# Patient Record
Sex: Female | Born: 1970
Health system: Southern US, Community
[De-identification: ages and names within clinical notes are randomized; demographics above are authoritative.]

## PROBLEM LIST (undated history)

## (undated) DIAGNOSIS — R200 Anesthesia of skin: Secondary | ICD-10-CM

## (undated) DIAGNOSIS — G43909 Migraine, unspecified, not intractable, without status migrainosus: Secondary | ICD-10-CM

## (undated) DIAGNOSIS — E669 Obesity, unspecified: Secondary | ICD-10-CM

## (undated) DIAGNOSIS — G35D Multiple sclerosis, unspecified: Secondary | ICD-10-CM

## (undated) DIAGNOSIS — M1990 Unspecified osteoarthritis, unspecified site: Secondary | ICD-10-CM

## (undated) DIAGNOSIS — J449 Chronic obstructive pulmonary disease, unspecified: Secondary | ICD-10-CM

## (undated) DIAGNOSIS — E559 Vitamin D deficiency, unspecified: Secondary | ICD-10-CM

## (undated) DIAGNOSIS — R5382 Chronic fatigue, unspecified: Secondary | ICD-10-CM

## (undated) DIAGNOSIS — G43109 Migraine with aura, not intractable, without status migrainosus: Secondary | ICD-10-CM

## (undated) DIAGNOSIS — E538 Deficiency of other specified B group vitamins: Secondary | ICD-10-CM

## (undated) DIAGNOSIS — R269 Unspecified abnormalities of gait and mobility: Secondary | ICD-10-CM

## (undated) DIAGNOSIS — K219 Gastro-esophageal reflux disease without esophagitis: Secondary | ICD-10-CM

## (undated) DIAGNOSIS — G35 Multiple sclerosis: Secondary | ICD-10-CM

## (undated) HISTORY — PX: FOOT SURGERY: SHX648

## (undated) HISTORY — DX: Multiple sclerosis: G35

## (undated) HISTORY — PX: COSMETIC SURGERY: SHX468

## (undated) HISTORY — DX: Multiple sclerosis, unspecified: G35.D

## (undated) HISTORY — DX: Migraine, unspecified, not intractable, without status migrainosus: G43.909

## (undated) HISTORY — DX: Gastro-esophageal reflux disease without esophagitis: K21.9

## (undated) HISTORY — DX: Chronic obstructive pulmonary disease, unspecified: J44.9

## (undated) HISTORY — DX: Unspecified abnormalities of gait and mobility: R26.9

## (undated) HISTORY — PX: NASAL SINUS SURGERY: SHX719

## (undated) HISTORY — DX: Chronic fatigue, unspecified: R53.82

## (undated) HISTORY — DX: Migraine with aura, not intractable, without status migrainosus: G43.109

## (undated) HISTORY — DX: Deficiency of other specified B group vitamins: E53.8

## (undated) HISTORY — DX: Obesity, unspecified: E66.9

## (undated) HISTORY — DX: Anesthesia of skin: R20.0

## (undated) HISTORY — DX: Unspecified osteoarthritis, unspecified site: M19.90

## (undated) HISTORY — PX: TUBAL LIGATION: SHX77

## (undated) HISTORY — DX: Vitamin D deficiency, unspecified: E55.9

---

## 1998-05-25 ENCOUNTER — Other Ambulatory Visit: Admission: RE | Admit: 1998-05-25 | Discharge: 1998-05-25 | Payer: Self-pay | Admitting: Gynecology

## 1999-03-28 ENCOUNTER — Other Ambulatory Visit: Admission: RE | Admit: 1999-03-28 | Discharge: 1999-03-28 | Payer: Self-pay | Admitting: Obstetrics and Gynecology

## 1999-03-28 ENCOUNTER — Encounter (INDEPENDENT_AMBULATORY_CARE_PROVIDER_SITE_OTHER): Payer: Self-pay

## 1999-04-02 ENCOUNTER — Other Ambulatory Visit: Admission: RE | Admit: 1999-04-02 | Discharge: 1999-04-02 | Payer: Self-pay | Admitting: Obstetrics and Gynecology

## 1999-06-13 ENCOUNTER — Other Ambulatory Visit: Admission: RE | Admit: 1999-06-13 | Discharge: 1999-06-13 | Payer: Self-pay | Admitting: Gynecology

## 2000-02-28 ENCOUNTER — Encounter: Payer: Self-pay | Admitting: Gynecology

## 2000-02-28 ENCOUNTER — Ambulatory Visit (HOSPITAL_COMMUNITY): Admission: RE | Admit: 2000-02-28 | Discharge: 2000-02-28 | Payer: Self-pay | Admitting: Gynecology

## 2000-03-04 ENCOUNTER — Inpatient Hospital Stay (HOSPITAL_COMMUNITY): Admission: AD | Admit: 2000-03-04 | Discharge: 2000-03-07 | Payer: Self-pay | Admitting: Gynecology

## 2000-03-04 ENCOUNTER — Encounter (INDEPENDENT_AMBULATORY_CARE_PROVIDER_SITE_OTHER): Payer: Self-pay

## 2000-03-10 ENCOUNTER — Encounter: Admission: RE | Admit: 2000-03-10 | Discharge: 2000-04-24 | Payer: Self-pay | Admitting: Gynecology

## 2000-04-22 ENCOUNTER — Other Ambulatory Visit: Admission: RE | Admit: 2000-04-22 | Discharge: 2000-04-22 | Payer: Self-pay | Admitting: Gynecology

## 2001-04-24 ENCOUNTER — Other Ambulatory Visit: Admission: RE | Admit: 2001-04-24 | Discharge: 2001-04-24 | Payer: Self-pay | Admitting: Gynecology

## 2007-04-28 ENCOUNTER — Other Ambulatory Visit: Admission: RE | Admit: 2007-04-28 | Discharge: 2007-04-28 | Payer: Self-pay | Admitting: Gynecology

## 2010-08-03 ENCOUNTER — Emergency Department (HOSPITAL_COMMUNITY)
Admission: EM | Admit: 2010-08-03 | Discharge: 2010-08-03 | Disposition: A | Discharge status: Home / Self Care | Payer: BC Managed Care – PPO | Attending: Emergency Medicine | Admitting: Emergency Medicine

## 2010-08-03 DIAGNOSIS — J329 Chronic sinusitis, unspecified: Secondary | ICD-10-CM | POA: Insufficient documentation

## 2010-08-03 DIAGNOSIS — G43909 Migraine, unspecified, not intractable, without status migrainosus: Secondary | ICD-10-CM | POA: Insufficient documentation

## 2010-08-31 NOTE — Discharge Summary (Signed)
[facility]  Patient:    Sue Sanchez, Sue Sanchez                   MRN: [id number] Adm. Date:  [id number] Disc. Date: [id number] Attending:  Merrily Pew Dictator:   Antony Contras, [facility]                           Discharge Summary  DISCHARGE DIAGNOSES:          Intrauterine pregnancy at term, history of previous cesarean section, desires repeat.  PROCEDURE:                    Low cervical transverse cesarean section, bilateral tubal sterilization with delivery of viable infant.  HISTORY OF PRESENT ILLNESS:   Patient is a 40 year old gravida 5, para 1-0-3-1 with LMP June 03, 1999, [facility] March 09, 2000.  Prenatal risk factors include history of previous cesarean section, history of maternal club foot. Patient also desires tubal sterilization.  LABORATORIES:                 Blood type O+.  Antibody screen negative.  RPR, HBSAG, HIV nonreactive.  Rubella immune.  MSAFP normal.  GBS positive.  HOSPITAL COURSE:              Patient was admitted on March 04, 2000 for repeat cesarean section and tubal sterilization performed by Dr. Audie Box assisted by Dr. Penni Homans under spinal anesthesia.  She was delivered of an Apgars 8/9 female infant weighing 8 pounds 13 ounces.  Pelvic findings included some uterine adhesions of the right tube and ovary.  Postoperative course: Patient remained afebrile.  Had no difficulty voiding.  Was able to be discharged in satisfactory condition on her third postoperative day.  CBC: Hematocrit 30.9, hemoglobin 11, WBC 9.6, platelets 180.  DISPOSITION:                  Follow up in six weeks.  Continue prenatal vitamins and iron.  Motrin and Tylox for pain. DD:  04/01/00 TD:  04/01/00 Job: [id number]

## 2010-08-31 NOTE — H&P (Signed)
[facility]  Patient:    Sue Sanchez, Sue Sanchez                   MRN: [id number] Adm. Date:  03/04/00 Attending:  Merrily Pew                         History and Physical  CHIEF COMPLAINT:              1. Pregnancy at term.                               2. Prior cesarean section.                               3. Desires repeat cesarean section.                               4. Desires permanent sterilization.  HISTORY OF PRESENT ILLNESS:   A 40 year old, G5, P45, AB3 female at term gestation with history of prior cesarean section who initially wanted attempt at [facility] but now has changed her mind and wants to proceed with a repeat cesarean section.  Risks, benefits, indications, and alternatives were reviewed with her and, again, she declines VBAC and wants to proceed with repeat cesarean section.  The patient also desires permanent tubal sterilization.  PAST MEDICAL HISTORY:         Uncomplicated.  PAST SURGICAL HISTORY:        Includes cesarean section, TAB x 2, orthopedic club foot surgery.  ALLERGIES:                    None.  REVIEW OF SYSTEMS:            Noncontributory.  SOCIAL HISTORY:               Noncontributory.  FAMILY HISTORY:               Noncontributory.  ADMISSION PHYSICAL EXAMINATION:  VITAL SIGNS:                  Afebrile, vital signs stable.  HEENT:                        Normal.  LUNGS:                        Clear.  CARDIAC:                      Regular rate.  No rubs, murmurs, or gallops.  ABDOMEN:                      Benign.  PELVIC:                       Gravid vertex fetus.  Pelvic deferred.  ASSESSMENT:                   1. Pregnancy at term.                               2. History of prior cesarean section.  3. Desires repeat cesarean section.                               4. Desires tubal sterilization.  The risks benefits, indications, and alternatives for the  procedure were reviewed with the patient to include the risks of infection requiring prolonged antibiotics, wound complications, opening and draining of incisions, closure by secondary intention, transfusion including transfusion reaction hepatitis and AIDs, major injury to internal organs including bowel, bladder, ureters, vessels, and nerves necessitating major exploratory repairative surgeries and future repairative surgeries including ostomy formatiion.  The permanency of the tubal sterilization was discussed and stressed as well as the potential for failure, and she does understand and accept the potential for failure.  The patients questions were answered to her satisfaction, and she is ready to proceed with surgery. DD:  03/03/00 TD:  03/03/00 Job: [id number]

## 2010-08-31 NOTE — Op Note (Signed)
[facility]  Patient:    Sue Sanchez, Sue Sanchez                   MRN: [id number] Proc. Date: 03/04/00 Adm. Date:  [id number] Attending:  Merrily Pew                           Operative Report  PREOPERATIVE DIAGNOSES:       1. Pregnancy at term.                               2. History of prior cesarean section.                               3. Desires repeat cesarean section.                               4. Desires permanent sterilization.  POSTOPERATIVE DIAGNOSES:      1. Pregnancy at term.                               2. History of prior cesarean section.                               3. Desires repeat cesarean section.                               4. Desires permanent sterilization.  PROCEDURE:                    Repeat low transverse cervical cesarean section, bilateral tubal sterilization, modified Pomeroy technique.  SURGEON:                      Timothy P. Fontaine, M.D.  ASSISTANT:                    Katy Fitch, M.D.  ESTIMATED BLOOD LOSS:         Less than 500 cc  ANESTHESIA:                   Spinal.  COMPLICATIONS:                None.  SPECIMENS:                    Samples of cord blood, right and left fallopian tube segments.  FINDINGS:                     Normal female infant at 1343, Apgars 8 and 9, weight 8 pounds 13 ounces.  Bladder flap noted to be adherent high on the mid uterine surface which was sharply lysed.  Right fallopian tube segment adherent to the right ovary, left undisturbed.  The remainder of pelvic anatomy noted to be normal.  DESCRIPTION OF PROCEDURE:     The patient was taken to the operating room and underwent regional anesthesia without difficulty and was placed in left-tilt supine position, received an abdominal preparation with Betadine scrub and Betadine solution.  The bladder was empty with an indwelling polar catheter,  placed by sterile technique by the nursing personnel.  The patient  was draped in the usual fashion.  After assuring adequate anesthesia, the abdomen was sharply entered through a repeat Pfannenstiel incision, achieving adequate hemostasis at all levels.  The bladder flap was noted to be relatively high on the lower uterine segment, and this was sharply and bluntly developed without difficulty, and the uterus was then sharply entered in the lower uterine segment and bluntly extended laterally.  The bulging membranes were then ruptured, the fluid noted to be clear.  The infants head delivered through the incision.  The nares and mouth suctioned.  A tight nuchal cord was doubly clamped, cut, and the rest of the infant was then delivered without difficulty.  The infant was then handed to pediatrics in attendance.  Samples of cord blood were obtained.  The placenta was then spontaneously extruded and noted to be intact.  The uterus was exteriorized, and the endometrial cavity was explored with a sponge to remove all placental and membrane fragments. The uterine incision was then closed in one layer using 0 Vicryl suture in a running interlocking stitch, achieving adequate hemostasis.  Attention was then turned to the tubal sterilization.  The left fallopian tube was then identified and traced from its insertion to its fimbriated ends, and a mid tubal segment was then doubly ligated using 0 plain suture and sharply excised.  Tubal lumen as well as adequate hemostasis was grossly visualized. A similar procedure was carried out on the other side.  Of note, on the right, there was a short segment of the fallopian tube that was adherent to the ovary, and this was left undisturbed, as the ovary was otherwise free and mobile, and no other adhesions noted.  The uterus was then returned to the abdomen which was copiously irrigated.  Both tubal suction sites were reinspected showing adequate hemostasis, and the anterior fascia was then reapproximated using 0 Vicryl  suture in a running stitch starting in the angle and meeting in the middle.  Subcutaneous tissues were irrigated.  Hemostasis achieved with electrocautery, and the skin was reapproximated with staples.  A sterile dressing was applied, and the patient was taken to the recovery room in good condition having tolerated the procedure well. DD:  03/04/00 TD:  03/05/00 Job: [id number]

## 2012-08-05 ENCOUNTER — Telehealth: Payer: Self-pay

## 2012-08-05 MED ORDER — GLATIRAMER ACETATE 40 MG/ML ~~LOC~~ SOSY: 40.0000 mg | PREFILLED_SYRINGE | SUBCUTANEOUS | Status: DC

## 2012-08-05 NOTE — Telephone Encounter (Signed)
Patient called clinic and left message saying [facility] and [facility] both stated they need a rx for Copaxone.  We have a fax confirmation showing this was successfully sent on 06/22/2012 at 9:17am.  I will refax enrollment form (rx on form) to [facility] [employer] and as well will send Rx to [facility].

## 2012-08-24 ENCOUNTER — Other Ambulatory Visit: Payer: Self-pay | Admitting: Neurology

## 2012-10-09 DIAGNOSIS — Z0289 Encounter for other administrative examinations: Secondary | ICD-10-CM

## 2012-10-29 ENCOUNTER — Telehealth: Payer: Self-pay

## 2012-10-29 NOTE — Telephone Encounter (Signed)
Called and left Vm. Forms faxed and copies put in mail.

## 2012-12-29 ENCOUNTER — Ambulatory Visit: Payer: Self-pay | Admitting: Nurse Practitioner

## 2013-01-12 ENCOUNTER — Ambulatory Visit: Payer: Self-pay | Admitting: Nurse Practitioner

## 2013-02-21 ENCOUNTER — Other Ambulatory Visit: Payer: Self-pay | Admitting: Neurology

## 2013-02-25 ENCOUNTER — Encounter: Payer: Self-pay | Admitting: Nurse Practitioner

## 2013-02-25 ENCOUNTER — Encounter (INDEPENDENT_AMBULATORY_CARE_PROVIDER_SITE_OTHER): Payer: Self-pay

## 2013-02-25 ENCOUNTER — Ambulatory Visit (INDEPENDENT_AMBULATORY_CARE_PROVIDER_SITE_OTHER): Payer: PRIVATE HEALTH INSURANCE | Admitting: Nurse Practitioner

## 2013-02-25 VITALS — BP 124/71 | HR 63 | Temp 97.2°F | Ht 64.0 in | Wt 195.3 lb

## 2013-02-25 DIAGNOSIS — G35 Multiple sclerosis: Secondary | ICD-10-CM

## 2013-02-25 NOTE — Patient Instructions (Addendum)
We would like you to consider going back on a MS medication to prevent future problems.  We need to check CBC, LFTs, and blood titer for Shingles.  You will need to have a recent eye exam (3 months), a cardiologist evaluation at Dr. Cletis Media office.    The first dose of Gilenya will be done in the cardiology office.  There will need to be a repeat eye exam after 3 months of starting the medication.  Review the information for the medication and call our office and ask to speak to [place] or Lupita Leash to get started on Gilenya.

## 2013-02-25 NOTE — Progress Notes (Signed)
[facility]  PATIENT: Sue Sanchez DOB: 1971/03/18   REASON FOR VISIT: follow up HISTORY FROM: patient  HISTORY OF PRESENT ILLNESS: 06/17/12 Sue Sanchez):  Sue Sanchez is a 42 year old right-handed white female with a history of multiple sclerosis and migraine headache. The patient indicates that her headaches are doing a bit better, but over the last month she has had increasing problems with fatigue, difficulty sleeping, and difficulty with memory and concentration. The patient has not been able to tolerate Tecfidera well, and she has had diarrhea and flushing sensations. The patient has been told that she snores quite a bit, and she feels quite fatigued even after she wakes up in the morning. The patient has come off of the Tecfidera as she has not tolerated the drug. The patient returns for an evaluation. No numbness, weakness, balance issues, or vision changes have been noted.  UPDATE 02/25/13 (LL): Sue Sanchez comes in for yearly follow up for MS.  She never went back on Copaxone after stopping Tecfidera, due to problems with injection sites and side effects of medication (flu-like symptoms).  She has been off MS meds since approximately December 2013.  She states she never felt good while on either of the medications.  She is experiencing fatigue and joint pains now, but denies any specific weakness or numbness, balance problems or vision problems.  REVIEW OF SYSTEMS: Full 14 system review of systems performed and notable only for:  Constitutional: fatigue  Musculoskeletal: joint pain, muscle spasms   ALLERGIES: No Known Allergies  HOME MEDICATIONS: Outpatient Prescriptions Prior to Visit  Medication Sig Dispense Refill  . propranolol (INDERAL) 40 MG tablet TAKE 1 TABLET BY MOUTH TWICE A DAY  180 tablet  1  . Glatiramer Acetate (COPAXONE) 40 MG/ML SOSY Inject 40 mg into the skin 3 (three) times a week.  36 Syringe  3   No facility-administered  medications prior to visit.    PAST MEDICAL HISTORY: Past Medical History  Diagnosis Date  . Migraine   . Obesity   . Gastroesophageal reflux disease   . Numbness     bilateral lower extremity  . MS (multiple sclerosis)     PAST SURGICAL HISTORY: Past Surgical History  Procedure Laterality Date  . Cesarean section    . Foot surgery      Club feet  . Cosmetic surgery      lower right leg  . Tubal ligation Bilateral     FAMILY HISTORY: Family History  Problem Relation Age of Onset  . Diabetes Mother   . Bipolar disorder Sister   . Bipolar disorder Brother   . Cancer Maternal Grandmother   . COPD Paternal Grandfather   . Heart disease Paternal Grandfather     SOCIAL HISTORY: History   Social History  . Marital Status: Married    Spouse Name: N/A    Number of Children: 2  . Years of Education: college   Occupational History  . medical office    Social History Main Topics  . Smoking status: Current Every Day Smoker  . Smokeless tobacco: Not on file  . Alcohol Use: No  . Drug Use: No  . Sexual Activity: Yes   Other Topics Concern  . Not on file   Social History Narrative  . No narrative on file   PHYSICAL EXAM  Filed Vitals:   02/25/13 0914  BP: 124/71  Pulse: 63  Temp: 97.2 F (36.2 C)  TempSrc: Oral  Height: 5\' 4"  (1.626 m)  Weight: 195 lb 4.8 oz (88.587 kg)   Body mass index is 33.51 kg/(m^2).  Generalized: Well developed, in no acute distress, overweight Caucasian female Head: normocephalic and atraumatic. Oropharynx benign  Neck: Supple, no carotid bruits  Cardiac: Regular rate rhythm, no murmur  Musculoskeletal: No deformity   Neurological examination  Mentation: Alert oriented to time, place, history taking. Follows all commands speech and language fluent Cranial nerve II-XII:  Pupils were equal round reactive to light extraocular movements were full, visual field were full on confrontational test. Facial sensation and strength were  normal. hearing was intact to finger rubbing bilaterally. Uvula tongue midline. head turning and shoulder shrug and were normal and symmetric.Tongue protrusion into cheek strength was normal. Motor: normal bulk and tone, full strength in the BUE, BLE, fine finger movements normal, no pronator drift. No focal weakness Sensory: normal and symmetric to light touch, pinprick, and  vibration  Coordination: normal finger-nose-finger, heel-to-shin bilaterally, no dysmetria Reflexes:  Deep tendon reflexes in the upper and lower extremities are present and symmetric.  Gait and Station: Rising up from seated position without assistance, normal stance, without trunk ataxia, moderate stride, good arm swing, smooth turning, able to perform tiptoe, and heel walking without difficulty.   DIAGNOSTIC DATA (LABS, IMAGING, TESTING) - I reviewed patient records, labs, notes, testing and imaging myself where available.  01/16/13:  Vit D: 23.8 Total Cholesterol: 186, HDL: 53, Triglycerides 96: LDL 114  Glu 76, BUN 15, Creatinine 1.06, eGFR 64, Na+ 141, K+ 4.5, Cl 97, CO2 26, Ca+ 9.7, Protein 7.0, Albumin 4.3, Globulin, Total, 2.7, A/G ratio 1.6, Bilirubin 0.6, Alk Phos 99, AST 11, ALT 6 WBC 10, RBC 4.94, Hgb 15, Hct 43.9, MCV 89, MCH 30.4, MCHC 34.2, RDW 13.4, Platelets 326, Neutrophils 66, Lymphs 20, Monocytes 12, Eos 1, Basos 1 TSH 1.380  ASSESSMENT AND PLAN 42 y.o. year old female  has a past medical history of Migraine; Obesity; Gastroesophageal reflux disease; Numbness; and MS (multiple sclerosis).  Patient has been off of MS medication for almost 1 year after failing Copaxone (due to site reactions) and Tecfidera (due to severe GI side effects).  Discussed options for treatment and information about Gilenya was given.  She will review the information and contact our office if she decides to proceed with Gilenya treatment.  CBC and CMP were done at PCP in October.  Orders Placed This Encounter  Procedures  .  Varicella zoster antibody, IgG  . Hepatic function panel   Sue Asal Afreen Siebels, MSN, NP-C 02/25/2013, 10:22 AM [facility] [address], [address] [place], [place] [address] [phone]  Note: This document was prepared with digital dictation and possible smart phrase technology. Any transcriptional errors that result from this process are unintentional.

## 2013-02-25 NOTE — Progress Notes (Signed)
I have read the note, and I agree with the clinical assessment and plan.  Sue Sanchez,Sue Sanchez   

## 2013-02-26 LAB — HEPATIC FUNCTION PANEL
ALT: 10 IU/L (ref 0–32)
AST: 9 IU/L (ref 0–40)
Albumin: 4 g/dL (ref 3.5–5.5)
Alkaline Phosphatase: 104 IU/L (ref 39–117)
Bilirubin, Direct: 0.11 mg/dL (ref 0.00–0.40)
Total Bilirubin: 0.3 mg/dL (ref 0.0–1.2)
Total Protein: 6.9 g/dL (ref 6.0–8.5)

## 2013-02-26 LAB — VARICELLA ZOSTER ANTIBODY, IGG: Varicella zoster IgG: 1251 index (ref >165)

## 2013-02-26 NOTE — Progress Notes (Signed)
Quick Note:  Mailed labs ______

## 2013-03-09 ENCOUNTER — Telehealth: Payer: Self-pay | Admitting: Neurology

## 2013-03-09 NOTE — Telephone Encounter (Signed)
Left message that we received the Gilenya, I asked her to return my call so I can explain about referrals for eye exam and cardiac consult and ECG.

## 2013-03-16 ENCOUNTER — Telehealth: Payer: Self-pay | Admitting: Neurology

## 2013-03-30 NOTE — Telephone Encounter (Signed)
Spoke to patient about starting the process for Gilenya.  She wants to wait until the first of the year before she goes forward.  She asked that we don't put in referrals to eye doctor and cardiologist until she calls and lets [place] know to proceed.

## 2013-03-30 NOTE — Telephone Encounter (Signed)
Conversation noted. I will wait for the patient to call me to start the process for Gilenya. I did not call the patient.

## 2013-03-31 NOTE — Telephone Encounter (Signed)
Spoke to patient and she wants to wait until the first of the year before she starts Gilenya.  She will be in contact with the office.

## 2013-04-27 ENCOUNTER — Telehealth: Payer: Self-pay | Admitting: Neurology

## 2013-04-27 NOTE — Telephone Encounter (Signed)
I called patient. The patient will need an ophthalmologic evaluation. If she needs a referral for this, she is to contact us. If she has not signed a form for Gilenya, she is to come in to get this done. She is to contact her office depending upon what needs to happen.

## 2013-04-27 NOTE — Telephone Encounter (Signed)
Patient is ready to schedule appointments to start Gilenya - okay to call work [phone] also.  Please call to advise.

## 2013-04-27 NOTE — Telephone Encounter (Signed)
Please advise 

## 2013-04-28 ENCOUNTER — Telehealth: Payer: Self-pay | Admitting: Neurology

## 2013-04-28 DIAGNOSIS — G35 Multiple sclerosis: Secondary | ICD-10-CM

## 2013-04-28 NOTE — Telephone Encounter (Signed)
Patient called stating that she needs a referral to do an eye exam for Gilenya, since she already had an eye exam this year and it won't be covered by insurance unless she has a referral. Please call the patient back.

## 2013-04-29 NOTE — Telephone Encounter (Signed)
Spoke to patient and relayed what exams needed to be done.  I have put in referral for eye exam and cardiac consult.  She is aware that she will be called by those offices for appointments and asked that they fax the results to our office.  Start form has been faxed to [facility].  Also patient has faxed new insurance card, I will forward to Jess.

## 2013-04-30 ENCOUNTER — Telehealth: Payer: Self-pay | Admitting: Neurology

## 2013-04-30 NOTE — Telephone Encounter (Signed)
Pleases advise

## 2013-04-30 NOTE — Telephone Encounter (Signed)
Called and asked to speak to Sue Sanchez wants to know when to stop BP medication before beginning Gilenya

## 2013-04-30 NOTE — Telephone Encounter (Signed)
I called the patient. The patient is on propranolol taking 40 mg twice daily. I indicated that prior to going on first dose Gilenya, she is to cut back on the propranolol taking 20 mg twice daily for 2 weeks, the stop the medication prior to the Gilenya dose. The patient has not yet been seen by her ophthalmologist.

## 2013-04-30 NOTE — Telephone Encounter (Signed)
Left message for patient asking for clarification about what she meant with stopping BP medication before Gilenya.  Told her I would check with the doctor.

## 2013-05-14 ENCOUNTER — Telehealth: Payer: Self-pay | Admitting: Neurology

## 2013-05-14 NOTE — Telephone Encounter (Signed)
Yolanda called and wanted to see if we received the prior authorization form for Gilenya.  They need it completed and faxed back as soon as possible so that they can send the medicine to the patient.  Please call Patsy Lager and let her know if she needs to fax it again.  Thank you

## 2013-05-25 ENCOUNTER — Telehealth: Payer: Self-pay | Admitting: Neurology

## 2013-05-25 NOTE — Telephone Encounter (Signed)
I spoke to Dr. Verl Dicker office and the patient is scheduled for her cardiology evaluation and ECG on 06-10-12.

## 2013-05-25 NOTE — Telephone Encounter (Signed)
Left message that [facility] has moved up her appointment to tomorrow 05-26-13 at 0930.  Asked her to call them at [phone] if she can't make the appointment.

## 2013-05-26 ENCOUNTER — Telehealth: Payer: Self-pay

## 2013-05-26 NOTE — Telephone Encounter (Signed)
[facility] [employer] Team with [employer] sent us a letter saying they have approved our request for coverage on Gilenya effective until 05/24/2014.  They have noted group number [id number] as well as an updated phone of [phone] or [phone].  Updated fax is [phone].  Direct number for [facility] [employer] With any questions is [phone].

## 2013-06-14 ENCOUNTER — Telehealth: Payer: Self-pay | Admitting: Neurology

## 2013-06-14 NOTE — Telephone Encounter (Signed)
Pt called and stated that she has been fighting a rash on her forehead, scalp and neck for several days.  She says that it will itch for about 30 minutes and give big red bumps and then will stop itching for a while.  She also stated that her left leg is numb and tingling.  She says she can move it and walk but it feels very heavy.  She would like to know if there is anything she can do about the rash as she has tried OTC allergy meds and lotions and they are not working.  Please call to discuss.  Thank you.

## 2013-06-14 NOTE — Telephone Encounter (Signed)
I called patient. The patient has had a two-day history of some burning and tingling of the left foot and ankle. The patient feels as if the left leg is heavy. The patient denies any weakness or actual changes in her walking. The patient denies any bowel or bladder control issues or problems with the arms. The patient is not on any disease modifying agents, as she has come off of Tecfidera. The patient will be getting an ophthalmology evaluation this week, and an EKG next week. The patient is preparing to go on Gilenya. I will not treat the patient with prednisone at this time. If the symptoms worsen or progress, she is to call his back, and I'll put her on a prednisone Dosepak.

## 2013-06-14 NOTE — Telephone Encounter (Signed)
Called and informed patient that she should contact her Primary first for the rash but will send message to Dr Anne Hahn concerning her leg numbness, tingling and heaviness

## 2013-06-18 ENCOUNTER — Telehealth: Payer: Self-pay | Admitting: Neurology

## 2013-06-18 NOTE — Telephone Encounter (Signed)
The patient has been seen by Dr. Dione Booze. Ophthalmologic evaluation shows 20/20 vision each eye, no evidence of macular edema. The patient has been cleared by ophthalmology for use of Gilenya.

## 2013-06-25 DIAGNOSIS — Z0289 Encounter for other administrative examinations: Secondary | ICD-10-CM

## 2013-06-30 ENCOUNTER — Encounter: Payer: Self-pay | Admitting: Neurology

## 2013-07-09 ENCOUNTER — Telehealth: Payer: Self-pay | Admitting: Neurology

## 2013-07-09 MED ORDER — FINGOLIMOD HCL 0.5 MG PO CAPS: 0.5000 mg | ORAL_CAPSULE | Freq: Every day | ORAL | Status: DC

## 2013-07-09 NOTE — Telephone Encounter (Signed)
Pt called and stated that she just started the Gilenya.  She stated that she did have to stay longer than the 6 hours and they only gave her 2 weeks of pills.  She wanted to let Dr. Anne HahnWillis know, but also wanted to know if she needs to have more than 2 weeks of pills.  Please call to advise.  Thank you

## 2013-07-09 NOTE — Telephone Encounter (Signed)
I called patient. I will call in a prescription for the Gilenya to keep the medication going.

## 2013-07-09 NOTE — Telephone Encounter (Signed)
Sent to me by mistake 

## 2013-07-16 ENCOUNTER — Telehealth: Payer: Self-pay | Admitting: Neurology

## 2013-07-16 NOTE — Telephone Encounter (Signed)
Patient returning call to Dr. Anne Hahn, please call and advise patient.

## 2013-07-16 NOTE — Telephone Encounter (Signed)
I called patient. Stomach cramps and diarrhea can be associated with Gilenya, but this is not common. I've recommended taking the medication with food, and it is okay to take probiotics. The patient will contact me if she is still having issues.

## 2013-07-16 NOTE — Telephone Encounter (Signed)
Patient is taking Gilenya--now having diarrhea and stomach cramps--will probiotics help or will it interfere with Gilenya--please call-thank you.

## 2013-07-16 NOTE — Telephone Encounter (Signed)
Pt has been on Gilenya for 8 days.  Started with stomach cramps and diarrhea this am.  Questioning about using probiotics and gilenya.  I told her that taking probiotics should be ok.  ? Side effects of gilenya or virus.   Pt will monitor and if things continue will call us back.  Will send to Dr. Anne Hahn.

## 2013-07-19 ENCOUNTER — Telehealth: Payer: Self-pay | Admitting: Neurology

## 2013-07-19 DIAGNOSIS — G479 Sleep disorder, unspecified: Secondary | ICD-10-CM

## 2013-07-19 NOTE — Telephone Encounter (Signed)
Pt called states she is not sure what is going on is doing a lot of crying, right side of her face is numb, sharp pains in her lower back. Please call pt concerning this matter. I did send over to Nurse pt didn't know whether to go to the hospital or wait to speak with Dr. Anne HahnWillis. Thanks

## 2013-07-19 NOTE — Telephone Encounter (Signed)
I called patient. The patient felt poorly after traveling this weekend, was somewhat shaky this morning. The patient feels better at this time. The patient indicates that she's had a lot of problems with snoring, she has been noted to be "gagging at night", with a lot of fatigue and excessive daytime drowsiness. I will set up a sleep study. The patient is doing well with the Gilenya so far. The patient has a revisit on 08/25/2013.

## 2013-07-19 NOTE — Telephone Encounter (Signed)
I called pt and she is c/o of base of skull/neck burning pain sensation.  She is fatigued from travel this weekend.  Has a cough (bronchitis) (is smoker).  C/o of breast/bellybutton MS hug yesterday.  Feels body shakes, although sees hands only shaking.  Cries with no reason.  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  ? Panic attack. Is not c/o weakness.  (not her usual sx of MS exacerbation).  I would forward message to Dr. Anne Hahn, but saw Larita Fife saw her last).

## 2013-07-20 ENCOUNTER — Telehealth: Payer: Self-pay | Admitting: Neurology

## 2013-07-20 DIAGNOSIS — E669 Obesity, unspecified: Secondary | ICD-10-CM

## 2013-07-20 DIAGNOSIS — G35 Multiple sclerosis: Secondary | ICD-10-CM

## 2013-07-20 DIAGNOSIS — R519 Headache, unspecified: Secondary | ICD-10-CM

## 2013-07-20 DIAGNOSIS — R51 Headache: Secondary | ICD-10-CM

## 2013-07-20 DIAGNOSIS — R400 Somnolence: Secondary | ICD-10-CM

## 2013-07-20 DIAGNOSIS — G4733 Obstructive sleep apnea (adult) (pediatric): Secondary | ICD-10-CM

## 2013-07-20 NOTE — Telephone Encounter (Signed)
. °  Dr. Lesly Dukesharles Keith Willis is referring Sue Sanchez, 43 y.o. female, for an evaluation of sleep apnea.  Wt: 195 lbs Ht: 64 in. BMI: 33.51  Diagnoses: Excessive Daytime Sleepiness Witnessed Apneas Fatigue Snoring  Non-restorative sleep Obesity Migraine MS  Medication List: Current Outpatient Prescriptions  Medication Sig Dispense Refill   esomeprazole (NEXIUM) 40 MG capsule Take 40 mg by mouth daily at 12 noon.       Fingolimod HCl (GILENYA) 0.5 MG CAPS Take 1 capsule (0.5 mg total) by mouth daily.  30 capsule  5   propranolol (INDERAL) 40 MG tablet TAKE 1 TABLET BY MOUTH TWICE A DAY  180 tablet  1   PROVENTIL HFA 108 (90 BASE) MCG/ACT inhaler        Vitamin D, Ergocalciferol, (DRISDOL) 50000 UNITS CAPS capsule 50,000 Units every 7 (seven) days.        No current facility-administered medications for this visit.   This patient contacts Dr. Stephanie Acreharles Willis with complaint of problems with snoring, she reports that she has noted to be "gagging at night".  Pt has marked fatigue and excessive daytime sleepiness.  She is also noted to be obese and suffers from migraines.  Please review for an attended sleep study  Insurance:  [employer] - Prior authorization is not required.

## 2013-07-21 ENCOUNTER — Telehealth: Payer: Self-pay | Admitting: Neurology

## 2013-07-21 NOTE — Telephone Encounter (Signed)
Called patient and scheduled/confirmed  for sooner appt

## 2013-07-21 NOTE — Telephone Encounter (Signed)
Events noted, I did not call the patient. 

## 2013-07-21 NOTE — Telephone Encounter (Signed)
Pt called.  She stated that she has been having issues with her memory and focus; her restless leg is in a lot of pain and it is shooting up to her hip; she is having a lot of anxiety and crying episodes and she is still having pain at the base of her skull (started 07-16-13). She didn't think she could wait until the previously scheduled appointment in May to be seen.  Please call to discuss.  Thank you

## 2013-07-21 NOTE — Telephone Encounter (Signed)
Patient was referred for a sleep study, benefits and coverage details were verified.  Called the patient to discuss deductible and out of pocket expense.  Due to a high deductible and very little accumulation towards it, she is unable to proceed at this time.  Did advise the patient that we could follow up with her in a 2-3 month period to see if she has applied enough to go forward with testing.  She was in agreement.

## 2013-07-22 ENCOUNTER — Encounter: Payer: Self-pay | Admitting: Nurse Practitioner

## 2013-07-22 ENCOUNTER — Telehealth: Payer: Self-pay | Admitting: Neurology

## 2013-07-22 ENCOUNTER — Ambulatory Visit (INDEPENDENT_AMBULATORY_CARE_PROVIDER_SITE_OTHER): Payer: PRIVATE HEALTH INSURANCE | Admitting: Nurse Practitioner

## 2013-07-22 VITALS — BP 134/83 | HR 76 | Temp 98.0°F | Ht 64.0 in | Wt 199.0 lb

## 2013-07-22 DIAGNOSIS — F341 Dysthymic disorder: Secondary | ICD-10-CM

## 2013-07-22 DIAGNOSIS — G35 Multiple sclerosis: Secondary | ICD-10-CM

## 2013-07-22 DIAGNOSIS — F418 Other specified anxiety disorders: Secondary | ICD-10-CM | POA: Insufficient documentation

## 2013-07-22 MED ORDER — ALPRAZOLAM 0.5 MG PO TABS: 0.5000 mg | ORAL_TABLET | Freq: Two times a day (BID) | ORAL | Status: DC | PRN

## 2013-07-22 MED ORDER — ESCITALOPRAM OXALATE 10 MG PO TABS: 10.0000 mg | ORAL_TABLET | Freq: Every day | ORAL | Status: DC

## 2013-07-22 NOTE — Telephone Encounter (Signed)
Marcelino DusterMichelle with [facility] called.  She is calling regarding the Gilenya prescription.  The patient in new with their company and they do not have a current allergy or medicine list for the patient.  The fax number where this can be sent is [phone].  Their phone number is [phone] opt [phone] and you can speak with any of the pharmacists.  Thank you

## 2013-07-22 NOTE — Progress Notes (Signed)
PATIENT: Sue Sanchez DOB: 11/29/1970  REASON FOR VISIT: acute visit HISTORY FROM: patient  HISTORY OF PRESENT ILLNESS: 06/17/12 (KW): Ms. Sue Sanchez is a 43 year old right-handed white female with a history of multiple sclerosis and migraine headache. The patient indicates that her headaches are doing a bit better, but over the last month she has had increasing problems with fatigue, difficulty sleeping, and difficulty with memory and concentration. The patient has not been able to tolerate Tecfidera well, and she has had diarrhea and flushing sensations. The patient has been told that she snores quite a bit, and she feels quite fatigued even after she wakes up in the morning. The patient has come off of the Tecfidera as she has not tolerated the drug. The patient returns for an evaluation. No numbness, weakness, balance issues, or vision changes have been noted.   02/25/13 (LL): Ms. Sue Sanchez comes in for yearly follow up for MS. She never went back on Copaxone after stopping Tecfidera, due to problems with injection sites and side effects of medication (flu-like symptoms). She has been off MS meds since approximately December 2013. She states she never felt good while on either of the medications. She is experiencing fatigue and joint pains now, but denies any specific weakness or numbness, balance problems or vision problems.   UPDATE 07/22/13 (LL):  Ms. Sue Sanchez comes in requesting sooner revisit due to problems related to MS.  She was started on Gilenya in March.  Over the Easter holiday she traveled and came back exhausted.  She is been on Gilenya 2 1/2 weeks without side effects except loose stools.  She has been increasingly having panicky feelings, muscle tension, trouble sleeping, and depression.  She states that she was having so much trouble keeping up at her job that she quit yesterday.  Her boyfriend of four years has chronic back pain and depression, and he has been unable  to work.  The relationship is a strain on her.  She has been having continued burning feelings in her left leg with numbness in her left foot.  She had a fall recently when she turned about and lost her balance.  She has had daily feelings of burning in the left occipital area near the hairline which comes around to the front of her face; her left face becoming numb.  Her HAM-A score is 45, suggesting severe anxiety.  REVIEW OF SYSTEMS: Full 14 system review of systems performed and notable only for:  Fatigue, light sensitivity, eye pain, neck stiffness, neck pain, hearing loss, runny nose, cough, leg swelling, flushing, diarrhea, insomnia, frequent waking, daytime sleepiness, snoring, difficulty urinating, urine stress incontinence, urine frequency, insomnia, freqiuent waking, daytime sleepiness, snoring, joint pain, joint swelling, muscle cramps, coordination problems, memory loss, dizziness, headache, numbness, weakness, agitation, confusion, depression, anxiety.  ALLERGIES: No Known Allergies  HOME MEDICATIONS: Outpatient Prescriptions Prior to Visit  Medication Sig Dispense Refill  . esomeprazole (NEXIUM) 40 MG capsule Take 40 mg by mouth daily at 12 noon.      . Fingolimod HCl (GILENYA) 0.5 MG CAPS Take 1 capsule (0.5 mg total) by mouth daily.  30 capsule  5  . PROVENTIL HFA 108 (90 BASE) MCG/ACT inhaler       . Vitamin D, Ergocalciferol, (DRISDOL) 50000 UNITS CAPS capsule 50,000 Units every 7 (seven) days.       . propranolol (INDERAL) 40 MG tablet TAKE 1 TABLET BY MOUTH TWICE A DAY  180 tablet  1   No facility-administered medications prior  to visit.   PHYSICAL EXAM  Filed Vitals:   07/22/13 1016  BP: 134/83  Pulse: 76  Temp: 98 F (36.7 C)  TempSrc: Oral  Height: 5\' 4"  (1.626 m)  Weight: 199 lb (90.266 kg)   Body mass index is 34.14 kg/(m^2).  Generalized: Well developed, in no acute distress  Head: normocephalic and atraumatic. 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 Skin: 1+ peripheral edema is noted.  Neurological examination  Mentation: Alert oriented to time, place, history taking. Follows all commands speech and language fluent.  Mood is depressed, tearful, anxious. Cranial nerve II-XII: Fundoscopic exam not done.  Pupils were equal round reactive to light extraocular movements were full, visual field were full on confrontational test. Facial sensation and strength were normal. hearing was intact to finger rubbing bilaterally. Uvula tongue midline. head turning and shoulder shrug and were normal and symmetric.Tongue protrusion into cheek strength was normal. Motor: The motor testing reveals 5 over 5 strength in upper extremities and RLE. Strength in LLE is 4/5.  Good symmetric motor tone is noted throughout.  Sensory: Sensory testing is intact to pinprick, soft touch in all four extremities, but decreased in bilateral feet, L>R.  No evidence of extinction is noted.  Coordination: Cerebellar testing reveals good finger-nose-finger and heel-to-shin bilaterally. Gait and station: Gait is mildly ataxic. Tandem gait is unsteady.  Romberg is negative.  No drift is seen.  Reflexes: Deep tendon reflexes are symmetric and brisk bilaterally.   DIAGNOSTIC DATA (LABS, IMAGING, TESTING) - I reviewed patient records, labs, notes, testing and imaging myself where available.  Lumbar puncture in 2013 had oligoclonal bands present. 2013- MRI brain and thoracic spine with changes consistent with MS.   ASSESSMENT AND PLAN 43 y.o. year old female  has a past medical history of Migraine; Obesity; Gastroesophageal reflux disease; Numbness; and MS (multiple sclerosis) here with disturbance of skin sensation associated with MS and depression with anxiety.  She has severe anxiety and depressive symptoms that have been getting worse over the last few months,  with now many somatic complaints.  She denies suicidal or homicidal ideation. She has never been any treatment for depression or anxiety but there is a strong family history as well.  I offered to refer her to Psychiatry or Psychologist but she has financial concerns. She recently had to quit her job due to the increased stress, she did not feel that she could keep up with the demands of the job.  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  She plans to file for disability.  PLAN: 1. Continue Gilenya, try taking the pills with a banana or applesauce to decrease stomach upset. 2. Start Lexapro 10 mg daily for depression and anxiety.  After 1 month, we may increase the dose if it is tolerated well. 3. In the meantime I am giving you Xanax to use when your anxiety is really bad.  Use 1 tablet every 8 hours on an as needed basis.  4. I have put in an order for a repeat MRI Brain.  It can be done at [facility]  If this is an extreme financial burden we can delay this for now. 5. If your symptoms of numbness, burning and corrdination gets worse over the next 1-2 weeks, call back to office and I will start you on a Prednisone  Dose Pack. 6. Follow up with me in 1 month, sooner as needed.  Orders Placed This Encounter  Procedures  . MR Brain W Wo Contrast   Meds ordered this encounter  Medications  . escitalopram (LEXAPRO) 10 MG tablet    Sig: Take 1 tablet (10 mg total) by mouth daily.    Dispense:  30 tablet    Refill:  1    Order Specific Question:  Supervising Provider    Answer:  Stephanie Acre K [[id number]]  . ALPRAZolam (XANAX) 0.5 MG tablet    Sig: Take 1 tablet (0.5 mg total) by mouth 2 (two) times daily as needed for anxiety.    Dispense:  30 tablet    Refill:  0    Order Specific Question:  Supervising Provider    Answer:  York Spaniel [[id number]]   Return in about 1 month (around 08/21/2013).  Ronal Fear, MSN, NP-C 07/22/2013, 11:27  AM [facility] [address], [address] [place], [place] [address] [phone]  Note: This document was prepared with digital dictation and possible smart phrase technology. Any transcriptional errors that result from this process are unintentional.

## 2013-07-22 NOTE — Patient Instructions (Signed)
I want to start Lexapro 10 mg daily for depression and anxiety.  After 1 month, we may increase the dose if it is tolerated well.  It takes 3-4 weeks for Lexapro to work.  In the meantime I am giving you Xanax to use when your anxiety is really bad.  Use 1 tablet on an as needed basis.    Continue Gilenya, try taking the pills with a banana or applesauce to decrease stomach upset.  I have put in an order for a repeat MRI Brain.  It can be done at [facility]  If your symptoms of numbness, burning and corrdination gets worse over the next 1-2 weeks, call back to office and I will start you on a Prednisone Dose Pack.  Follow up in 2 months, sooner as needed.

## 2013-07-22 NOTE — Telephone Encounter (Signed)
Requested info has been faxed

## 2013-07-23 NOTE — Telephone Encounter (Signed)
This patient with an underlying Hx of MS is referred by Dr. Anne Hahn for a sleep study due to a report of snoring, headaches, EDS and witnessed apneas. I will order a split-night sleep study and see the patient in sleep medicine consultation afterwards.

## 2013-07-28 ENCOUNTER — Telehealth: Payer: Self-pay | Admitting: Neurology

## 2013-07-28 NOTE — Telephone Encounter (Signed)
I called patient. The MRI the brain shows findings consistent with multiple sclerosis. There are 2 new white matter lesions on the right brain lateral to the lateral ventricle. The patient has been on and off various treatments for MS over the last year, the comparison study for this evaluation was 10/16/2011. The patient is now on Gilenya. No alteration in therapy needed. The patient has been on Gilenya since March of 2015.  The MRI study was done at [facility].

## 2013-08-25 ENCOUNTER — Ambulatory Visit: Payer: PRIVATE HEALTH INSURANCE | Admitting: Nurse Practitioner

## 2013-08-26 ENCOUNTER — Ambulatory Visit (INDEPENDENT_AMBULATORY_CARE_PROVIDER_SITE_OTHER): Payer: Medicaid Other | Admitting: Nurse Practitioner

## 2013-08-26 ENCOUNTER — Encounter: Payer: Self-pay | Admitting: Nurse Practitioner

## 2013-08-26 VITALS — BP 118/68 | HR 65 | Ht 63.0 in | Wt 194.0 lb

## 2013-08-26 DIAGNOSIS — G35 Multiple sclerosis: Secondary | ICD-10-CM

## 2013-08-26 DIAGNOSIS — F341 Dysthymic disorder: Secondary | ICD-10-CM

## 2013-08-26 DIAGNOSIS — F418 Other specified anxiety disorders: Secondary | ICD-10-CM

## 2013-08-26 DIAGNOSIS — G479 Sleep disorder, unspecified: Secondary | ICD-10-CM

## 2013-08-26 MED ORDER — ESCITALOPRAM OXALATE 10 MG PO TABS: 20.0000 mg | ORAL_TABLET | Freq: Every day | ORAL | Status: DC

## 2013-08-26 NOTE — Patient Instructions (Addendum)
Increase Lexapro to 1 and 1/2 tablets per day and you may increase to 2 tablets after 2 weeks if you are still feeling on-edge, sad or irritable.  Continue Gilenya.  Follow up in 6 months, sooner as needed.

## 2013-08-26 NOTE — Progress Notes (Signed)
I have read the note, and I agree with the clinical assessment and plan.  Charles K Willis   

## 2013-08-26 NOTE — Progress Notes (Signed)
PATIENT: Sue Sanchez DOB: 04-23-1970  REASON FOR VISIT:  follow up for MS HISTORY FROM: patient  HISTORY OF PRESENT ILLNESS: 06/17/12 Sue Sanchez): Sue Sanchez is a 43 year old right-handed white female with a history of multiple sclerosis and migraine headache. The patient indicates that her headaches are doing a bit better, but over the last month she has had increasing problems with fatigue, difficulty sleeping, and difficulty with memory and concentration. The patient has not been able to tolerate Tecfidera well, and she has had diarrhea and flushing sensations. The patient has been told that she snores quite a bit, and she feels quite fatigued even after she wakes up in the morning. The patient has come off of the Tecfidera as she has not tolerated the drug. The patient returns for an evaluation. No numbness, weakness, balance issues, or vision changes have been noted.   02/25/13 (LL): Sue Sanchez comes in for yearly follow up for MS. She never went back on Copaxone after stopping Tecfidera, due to problems with injection sites and side effects of medication (flu-like symptoms). She has been off MS meds since approximately December 2013. She states she never felt good while on either of the medications. She is experiencing fatigue and joint pains now, but denies any specific weakness or numbness, balance problems or vision problems.   UPDATE 07/22/13 (LL): Sue Sanchez comes in requesting sooner revisit due to problems related to MS. She was started on Gilenya in March. Over the Easter holiday she traveled and came back exhausted. She is been on Gilenya 2 1/2 weeks without side effects except loose stools. She has been increasingly having panicky feelings, muscle tension, trouble sleeping, and depression. She states that she was having so much trouble keeping up at her job that she quit yesterday. Her boyfriend of four years has chronic back pain and depression, and he has been unable  to work. The relationship is a strain on her. She has been having continued burning feelings in her left leg with numbness in her left foot. She had a fall recently when she turned about and lost her balance. She has had daily feelings of burning in the left occipital area near the hairline which comes around to the front of her face; her left face becoming numb. Her HAM-A score is 45, suggesting severe anxiety.   UPDATE 08/26/13 (LL):  Since last visit, started Lexapro and crying spells have stopped, still has "short fuse" and not sleeping well. Has not taken Xanax due to making her feel hung-over.  She has been having an increase in headaches since Dr. Jacinto Halim had stopped Propranolol for Gilenya start-up, he has advised her to go back on it and will have check up with him in 1 month.  Repeat MRI brain shows findings consistent with multiple sclerosis. There are 2 new white matter lesions on the right brain lateral to the lateral ventricle. Comparison study for this evaluation was 10/16/2011. The patient is tolerating Gilenya well.   REVIEW OF SYSTEMS: Full 14 system review of systems performed and notable only for:  Fatigue, appetite change, light sensitivity, eye pain, neck stiffness, neck pain, hearing loss, runny nose, cough, leg swelling, flushing, diarrhea, insomnia, frequent waking, daytime sleepiness, snoring, difficulty urinating, urine stress incontinence, urine frequency, insomnia, frequent waking, daytime sleepiness, snoring, joint pain, joint swelling, muscle cramps, coordination problems, memory loss  ALLERGIES: No Known Allergies  HOME MEDICATIONS: Outpatient Prescriptions Prior to Visit  Medication Sig Dispense Refill  . ALPRAZolam (XANAX) 0.5 MG tablet  Take 1 tablet (0.5 mg total) by mouth 2 (two) times daily as needed for anxiety.  30 tablet  0  . esomeprazole (NEXIUM) 40 MG capsule Take 40 mg by mouth daily at 12 noon.      . Fingolimod HCl (GILENYA) 0.5 MG CAPS Take 1 capsule (0.5 mg  total) by mouth daily.  30 capsule  5  . propranolol (INDERAL) 40 MG tablet TAKE 1 TABLET BY MOUTH TWICE A DAY  180 tablet  1  . PROVENTIL HFA 108 (90 BASE) MCG/ACT inhaler       . Vitamin D, Ergocalciferol, (DRISDOL) 50000 UNITS CAPS capsule 50,000 Units every 7 (seven) days.       Marland Kitchen. escitalopram (LEXAPRO) 10 MG tablet Take 1 tablet (10 mg total) by mouth daily.  30 tablet  1   No facility-administered medications prior to visit.     PHYSICAL EXAM  Filed Vitals:   08/26/13 0954  BP: 118/68  Pulse: 65  Height: 5\' 3"  (1.6 m)  Weight: 194 lb (87.998 kg)   Body mass index is 34.37 kg/(m^2).  Generalized: Well developed, in no acute distress  Head: normocephalic and atraumatic. 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  Skin: 1+ peripheral edema is noted.   Neurological examination  Mentation: Alert oriented to time, place, history taking. Follows all commands speech and language fluent. Mood is depressed, tearful, anxious.  Cranial nerve II-XII: Fundoscopic exam not done. Pupils were equal round reactive to light extraocular movements were full, visual field were full on confrontational test. Facial sensation and strength were normal. hearing was intact to finger rubbing bilaterally. Uvula tongue midline. head turning and shoulder shrug and were normal and symmetric.Tongue protrusion into cheek strength was normal.  Motor: The motor testing reveals 5 over 5 strength in upper extremities and RLE. Strength in LLE is 4/5. Good symmetric motor tone is noted throughout.  Sensory: Sensory testing is intact to pinprick, soft touch in all four extremities, but decreased in bilateral feet, L>R. No evidence of extinction is noted.  Coordination: Cerebellar testing reveals good finger-nose-finger and heel-to-shin bilaterally.  Gait and station: Gait is mildly ataxic. Tandem  gait is unsteady. Romberg is negative. No drift is seen.  Reflexes: Deep tendon reflexes are symmetric and brisk bilaterally.   Lumbar puncture in 2013 had oligoclonal bands present.  2013- MRI brain and thoracic spine with changes consistent with MS.  2015- MRI brain showing  findings consistent with multiple sclerosis. There are 2 new white matter lesions on the right brain lateral to the lateral ventricle.   ASSESSMENT AND PLAN 43 y.o. year old female has a past medical history of Migraine; Obesity; Gastroesophageal reflux disease; Numbness; and MS (multiple sclerosis) here with disturbance of skin sensation associated with MS and depression with anxiety. She has severe anxiety and depressive symptoms that have been getting worse over the last few months, with now many somatic complaints. She denies suicidal or homicidal ideation. She has never been any treatment for depression or anxiety but there is a strong family history as well. I offered to refer her to Psychiatry or Psychologist but she has financial concerns. She is improving on Lexapro.  She recently had to quit her job due to the increased stress, she did not feel that she could keep up with the demands of the job. 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 She plans to file for disability.     PLAN:  1. Continue Gilenya, try taking the pills with a banana or applesauce to decrease stomach upset.  2. Increase Lexapro 20 mg daily for depression and anxiety.   3. Follow up with me in 6 month, sooner as needed.  Meds ordered this encounter  Medications  . DISCONTD: escitalopram (LEXAPRO) 10 MG tablet    Sig: Take 2 tablets (20 mg total) by mouth daily.    Dispense:  60 tablet    Refill:  5    Order Specific Question:  Supervising Provider    Answer:  Stephanie Acre K [[id number]]  . escitalopram (LEXAPRO) 10 MG tablet    Sig: Take 2 tablets (20 mg total) by mouth daily.    Dispense:  60 tablet      Refill:  5    Order Specific Question:  Supervising Provider    Answer:  York Spaniel [[id number]]   Tawny Asal Corrine Tillis, MSN, NP-C 08/26/2013, 10:13 AM [facility] [address] [place], [place] [address] [phone]  Note: This document was prepared with digital dictation and possible smart phrase technology. Any transcriptional errors that result from this process are unintentional.

## 2013-09-01 ENCOUNTER — Encounter: Payer: Self-pay | Admitting: Neurology

## 2013-09-21 ENCOUNTER — Other Ambulatory Visit: Payer: Self-pay | Admitting: Nurse Practitioner

## 2013-09-21 ENCOUNTER — Telehealth: Payer: Self-pay | Admitting: Nurse Practitioner

## 2013-09-21 DIAGNOSIS — G35 Multiple sclerosis: Secondary | ICD-10-CM

## 2013-09-21 NOTE — Telephone Encounter (Signed)
Sue Fife do you want to write prescription for cooling vest?

## 2013-09-21 NOTE — Telephone Encounter (Signed)
Yes, MD must sign for DME though.

## 2013-09-21 NOTE — Telephone Encounter (Signed)
Patient requesting Rx written with Diagnosis code for MS.  Patient wants to get a Pre cooling vest from [employer].  Please call when Rx is ready for pick up.

## 2013-09-22 ENCOUNTER — Other Ambulatory Visit: Payer: Self-pay | Admitting: Nurse Practitioner

## 2013-09-22 DIAGNOSIS — G35 Multiple sclerosis: Secondary | ICD-10-CM

## 2013-09-22 NOTE — Telephone Encounter (Signed)
Spoke to patient and relayed Rx and information about cooling vests at front desk for pick up.

## 2013-09-23 ENCOUNTER — Telehealth: Payer: Self-pay | Admitting: Neurology

## 2013-09-23 NOTE — Telephone Encounter (Signed)
Called [facility] and talked with Marcelino Scot, giving a verbal order, per Dr. Anne Hahn Rx refill on 06/2013, to dispense medication 30 caps, with 5 refills. I advised them that if they have any other questions to call our office. They verbalized understanding.

## 2013-09-23 NOTE — Telephone Encounter (Signed)
Obi with [facility] calling to get authorization. The manufacturer has changed packaging forFingolimod HCl (GILENYA) 0.5 MG CAPS from 28 days to 30 days.  Please call to give authorization to dispense medication. [phone]

## 2014-01-03 ENCOUNTER — Telehealth: Payer: Self-pay | Admitting: Nurse Practitioner

## 2014-01-03 ENCOUNTER — Other Ambulatory Visit: Payer: Self-pay | Admitting: Neurology

## 2014-01-03 DIAGNOSIS — G35 Multiple sclerosis: Secondary | ICD-10-CM

## 2014-01-03 MED ORDER — PROPRANOLOL HCL 40 MG PO TABS: ORAL_TABLET | ORAL | Status: DC

## 2014-01-03 NOTE — Telephone Encounter (Signed)
Pt's Rx was sent to her pharmacy, confirmation received.

## 2014-01-03 NOTE — Telephone Encounter (Signed)
Patient check status of Rx refill request for propranolol (INDERAL) 40 MG tablet sent from [facility] on [address].  Please call anytime.

## 2014-02-28 ENCOUNTER — Ambulatory Visit (INDEPENDENT_AMBULATORY_CARE_PROVIDER_SITE_OTHER): Payer: Medicaid Other | Admitting: Nurse Practitioner

## 2014-02-28 ENCOUNTER — Encounter: Payer: Self-pay | Admitting: Nurse Practitioner

## 2014-02-28 VITALS — BP 110/66 | HR 62 | Ht 63.0 in | Wt 200.0 lb

## 2014-02-28 DIAGNOSIS — G35 Multiple sclerosis: Secondary | ICD-10-CM

## 2014-02-28 DIAGNOSIS — R5383 Other fatigue: Secondary | ICD-10-CM

## 2014-02-28 DIAGNOSIS — R413 Other amnesia: Secondary | ICD-10-CM

## 2014-02-28 DIAGNOSIS — F418 Other specified anxiety disorders: Secondary | ICD-10-CM

## 2014-02-28 MED ORDER — ESCITALOPRAM OXALATE 10 MG PO TABS: 10.0000 mg | ORAL_TABLET | Freq: Every day | ORAL | Status: DC

## 2014-02-28 NOTE — Progress Notes (Signed)
PATIENT: Sue Sanchez DOB: [birth year]  REASON FOR VISIT: routine follow up for MS HISTORY FROM: patient  HISTORY OF PRESENT ILLNESS: 06/17/12 Sue Sanchez): Sue Sanchez is a 43 year old right-handed white female with a history of multiple sclerosis and migraine headache. The patient indicates that her headaches are doing a bit better, but over the last month she has had increasing problems with fatigue, difficulty sleeping, and difficulty with memory and concentration. The patient has not been able to tolerate Tecfidera well, and she has had diarrhea and flushing sensations. The patient has been told that she snores quite a bit, and she feels quite fatigued even after she wakes up in the morning. The patient has come off of the Tecfidera as she has not tolerated the drug. The patient returns for an evaluation. No numbness, weakness, balance issues, or vision changes have been noted.   02/25/13 (LL): Sue Sanchez comes in for yearly follow up for MS. She never went back on Copaxone after stopping Tecfidera, due to problems with injection sites and side effects of medication (flu-like symptoms). She has been off MS meds since approximately December 2013. She states she never felt good while on either of the medications. She is experiencing fatigue and joint pains now, but denies any specific weakness or numbness, balance problems or vision problems.   UPDATE 07/22/13 (LL): Sue Sanchez comes in requesting sooner revisit due to problems related to MS. She was started on Gilenya in March. Over the Easter holiday she traveled and came back exhausted. She is been on Gilenya 2 1/2 weeks without side effects except loose stools. She has been increasingly having panicky feelings, muscle tension, trouble sleeping, and depression. She states that she was having so much trouble keeping up at her job that she quit yesterday. Her boyfriend of four years has chronic back pain and depression, and he has been  unable to work. The relationship is a strain on her. She has been having continued burning feelings in her left leg with numbness in her left foot. She had a fall recently when she turned about and lost her balance. She has had daily feelings of burning in the left occipital area near the hairline which comes around to the front of her face; her left face becoming numb. Her HAM-A score is 45, suggesting severe anxiety.   UPDATE 08/26/13 (LL):  Since last visit, started Lexapro and crying spells have stopped, still has "short fuse" and not sleeping well. Has not taken Xanax due to making her feel hung-over.  She has been having an increase in headaches since Dr. Jacinto Halim had stopped Propranolol for Gilenya start-up, he has advised her to go back on it and will have check up with him in 1 month.  Repeat MRI brain shows findings consistent with multiple sclerosis. There are 2 new white matter lesions on the right brain lateral to the lateral ventricle. Comparison study for this evaluation was 10/16/2011. The patient is tolerating Gilenya well.   UPDATE 02/28/14 (LL): Since last visit, tolerating Gilenya well. Anxiety and depressive symptoms that have been better on Lexapro, but more agitation and diarrhea on higher dose. She has never been any treatment for depression or anxiety but there is a strong family history as well. I offered to refer her to Psychiatry or Psychologist but she has financial concerns. She has not had a decision on her SSI disabillty claim, had to see their MD for evaluation and must see a Psychiatrist as well. Having problems with reflux,  changed from Nexium to Protonix, but poor appetite since change and more burning in chest. Back on Inderal for headache prevention.  REVIEW OF SYSTEMS: Full 14 system review of systems performed and notable only for:   Appetite change, neck stiffness, neck pain, cough, diarrhea, constipation, daytime sleepiness, snoring, difficulty urinating, urine stress  incontinence, urine frequency, insomnia, frequent waking, daytime sleepiness, snoring, joint pain, muscle cramps, coordination problems, memory loss, headache, numbness, agitation  ALLERGIES: No Known Allergies  HOME MEDICATIONS: Outpatient Prescriptions Prior to Visit  Medication Sig Dispense Refill  . Fingolimod HCl (GILENYA) 0.5 MG CAPS Take 1 capsule (0.5 mg total) by mouth daily. 30 capsule 5  . propranolol (INDERAL) 40 MG tablet TAKE 1 TABLET BY MOUTH TWICE A DAY 180 tablet 1  . PROVENTIL HFA 108 (90 BASE) MCG/ACT inhaler     . escitalopram (LEXAPRO) 10 MG tablet Take 2 tablets (20 mg total) by mouth daily. 60 tablet 5  . esomeprazole (NEXIUM) 40 MG capsule Take 40 mg by mouth daily at 12 noon.    Marland Kitchen. ALPRAZolam (XANAX) 0.5 MG tablet Take 1 tablet (0.5 mg total) by mouth 2 (two) times daily as needed for anxiety. 30 tablet 0  . Vitamin D, Ergocalciferol, (DRISDOL) 50000 UNITS CAPS capsule 50,000 Units every 7 (seven) days.      No facility-administered medications prior to visit.    PHYSICAL EXAM Filed Vitals:   02/28/14 1112  BP: 110/66  Pulse: 62  Height: 5\' 3"  (1.6 m)  Weight: 200 lb (90.719 kg)   Body mass index is 35.44 kg/(m^2).  Generalized: Well developed, in no acute distress   Head: normocephalic and atraumatic. 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   Skin: 1+ peripheral edema is noted.   Neurological examination   Mentation: Alert oriented to time, place, history taking. Follows all commands speech and language fluent. Mood is depressed, tearful, anxious.   Cranial nerve II-XII: Fundoscopic exam not done. Pupils were equal round reactive to light extraocular movements were full, visual field were full on confrontational test. Facial sensation and strength were normal. hearing was intact to finger rubbing bilaterally. Uvula tongue  midline. head turning and shoulder shrug and were normal and symmetric.Tongue protrusion into cheek strength was normal.   Motor: The motor testing reveals 5 over 5 strength in upper extremities and RLE. Strength in LLE is 4/5. Good symmetric motor tone is noted throughout.   Sensory: Sensory testing is intact to pinprick, soft touch in all four extremities, but decreased in bilateral feet, L>R. No evidence of extinction is noted.   Coordination: Cerebellar testing reveals good finger-nose-finger and heel-to-shin bilaterally.   Gait and station: Gait is mildly ataxic. Tandem gait is unsteady. Romberg is negative. No drift is seen.   Reflexes: Deep tendon reflexes are symmetric and brisk bilaterally.   Lumbar puncture in 2013 had oligoclonal bands present.   2013- MRI brain and thoracic spine with changes consistent with MS.  2015- MRI brain showing  findings consistent with multiple sclerosis. There are 2 new white matter lesions on the right brain lateral to the lateral ventricle.     ASSESSMENT: 43 year old female has a past medical history of Migraine; Obesity; Gastroesophageal reflux disease; Numbness; and MS (multiple sclerosis) here with disturbance of skin sensation associated with MS and depression with anxiety. She is improved on Lexapro, but having worse  diarrhea with higher dose.  She had to quit her job due to the increased stress, she did not feel that she could keep up with the demands of the job. 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 Having persistent fatigue. She filed for disability, no decision yet.    PLAN:   1. Continue Gilenya, try taking the pills with a banana or applesauce to decrease stomach upset.   2. Reduce Lexapro back to 10 mg daily for depression and anxiety, she is having diarrhea daily, may be cause.  3. Check CBC wdiff, Vit D and B12, Liver function.  4. If labs ok and excessive fatigue persists, may discuss  Modafinil at next visit. 5. Please see your PCP to discuss reflux, Protonix vs. Nexium. 6. Follow up in 6 months with Dr. Anne Hahn, sooner as needed.  Orders Placed This Encounter  Procedures  . CBC With differential/Platelet  . Vit D  25 hydroxy (rtn osteoporosis monitoring)  . Vitamin B12  . Comprehensive metabolic panel   Meds ordered this encounter  Medications  . escitalopram (LEXAPRO) 10 MG tablet    Sig: Take 1 tablet (10 mg total) by mouth daily.    Dispense:  30 tablet    Refill:  5    Order Specific Question:  Supervising Provider    Answer:  York Spaniel [[id number]]   Return in about 6 months (around 08/29/2014) for MS.  Tawny Asal Krista Godsil, MSN, FNP-BC, A/GNP-C 02/28/2014, 12:12 PM [facility] [address] [place], [place] [address] [phone]  Note: This document was prepared with digital dictation and possible smart phrase technology. Any transcriptional errors that result from this process are unintentional.

## 2014-02-28 NOTE — Patient Instructions (Addendum)
Continue Gilenya at current dose.    Reduce Lexapro to 10 mg daily, to see if this will have any change in your diarrhea.   Check CBC wdiff, Vit D and B12, Liver function.  Please see your PCP to discuss reflux, Protonix vs. Nexium.  Follow up in 6 months, sooner as needed.

## 2014-02-28 NOTE — Progress Notes (Signed)
I have read the note, and I agree with the clinical assessment and plan.  Sue Sanchez   

## 2014-03-01 ENCOUNTER — Telehealth: Payer: Self-pay | Admitting: Nurse Practitioner

## 2014-03-01 LAB — COMPREHENSIVE METABOLIC PANEL
ALT: 21 IU/L (ref 0–32)
AST: 23 IU/L (ref 0–40)
Albumin/Globulin Ratio: 1.5 (ref 1.1–2.5)
Albumin: 4.1 g/dL (ref 3.5–5.5)
Alkaline Phosphatase: 124 IU/L — ABNORMAL HIGH (ref 39–117)
BUN/Creatinine Ratio: 9 (ref 9–23)
BUN: 9 mg/dL (ref 6–24)
CO2: 24 mmol/L (ref 18–29)
Calcium: 8.9 mg/dL (ref 8.7–10.2)
Chloride: 98 mmol/L (ref 97–108)
Creatinine, Ser: 0.96 mg/dL (ref 0.57–1.00)
GFR calc Af Amer: 83 mL/min/{1.73_m2} (ref >59)
GFR calc non Af Amer: 72 mL/min/{1.73_m2} (ref >59)
Globulin, Total: 2.7 g/dL (ref 1.5–4.5)
Glucose: 85 mg/dL (ref 65–99)
Potassium: 4.2 mmol/L (ref 3.5–5.2)
Sodium: 141 mmol/L (ref 134–144)
Total Bilirubin: 0.4 mg/dL (ref 0.0–1.2)
Total Protein: 6.8 g/dL (ref 6.0–8.5)

## 2014-03-01 LAB — CBC WITH DIFFERENTIAL
Basophils Absolute: 0 10*3/uL (ref 0.0–0.2)
Basos: 1 %
Eos: 8 %
Eosinophils Absolute: 0.4 10*3/uL (ref 0.0–0.4)
HCT: 43.9 % (ref 34.0–46.6)
Hemoglobin: 15.1 g/dL (ref 11.1–15.9)
Immature Grans (Abs): 0 10*3/uL (ref 0.0–0.1)
Immature Granulocytes: 0 %
Lymphocytes Absolute: 0.7 10*3/uL (ref 0.7–3.1)
Lymphs: 11 %
MCH: 30.9 pg (ref 26.6–33.0)
MCHC: 34.4 g/dL (ref 31.5–35.7)
MCV: 90 fL (ref 79–97)
Monocytes Absolute: 0.7 10*3/uL (ref 0.1–0.9)
Monocytes: 12 %
Neutrophils Absolute: 4 10*3/uL (ref 1.4–7.0)
Neutrophils Relative %: 68 %
Platelets: 335 10*3/uL (ref 150–379)
RBC: 4.89 x10E6/uL (ref 3.77–5.28)
RDW: 13.3 % (ref 12.3–15.4)
WBC: 5.9 10*3/uL (ref 3.4–10.8)

## 2014-03-01 LAB — VITAMIN B12: Vitamin B-12: 192 pg/mL — ABNORMAL LOW (ref 211–946)

## 2014-03-01 LAB — VITAMIN D 25 HYDROXY (VIT D DEFICIENCY, FRACTURES): Vit D, 25-Hydroxy: 20 ng/mL — ABNORMAL LOW (ref 30.0–100.0)

## 2014-03-01 NOTE — Telephone Encounter (Signed)
Called patient back and answered questions about lab results.

## 2014-03-01 NOTE — Telephone Encounter (Signed)
Pt called back to let Heide Guile know that she wants to get her B12 shots in [place] at her PCP.  Pt would also like for you to call her back she has questions regarding lab results.

## 2014-03-01 NOTE — Telephone Encounter (Signed)
Called patient with lab results, both Vitamin D and B12 are very low. Need to start supplementation back of Vitamin D. Need to start B12 injections, either in our office or PCP's office. Instructed to call back and let me know if she wants to get B12 injections in our office.

## 2014-06-17 DIAGNOSIS — Z0289 Encounter for other administrative examinations: Secondary | ICD-10-CM

## 2014-07-01 ENCOUNTER — Other Ambulatory Visit: Payer: Self-pay

## 2014-07-01 ENCOUNTER — Telehealth: Payer: Self-pay | Admitting: Neurology

## 2014-07-01 DIAGNOSIS — G35 Multiple sclerosis: Secondary | ICD-10-CM

## 2014-07-01 MED ORDER — PROPRANOLOL HCL 40 MG PO TABS: ORAL_TABLET | ORAL | Status: DC

## 2014-07-01 NOTE — Telephone Encounter (Signed)
Pt needs refill for propranolol (INDERAL) 40 MG tablet sent to [facility] [address]  Please advise.

## 2014-07-01 NOTE — Telephone Encounter (Signed)
Encounter was already closed when it was sent.  Refill provided per notes.

## 2014-08-02 ENCOUNTER — Telehealth: Payer: Self-pay | Admitting: Neurology

## 2014-08-02 NOTE — Telephone Encounter (Signed)
Sue Sanchez with [facility] @ [phone] requesting PA for Rx Fingolimod HCl (GILENYA) 0.5 MG CAPS.  Please call and advise.

## 2014-08-02 NOTE — Telephone Encounter (Signed)
Request has been sent to ins Ref Key: [id number] I called back to advise.  Got a voicemail that only said this is Valora Corporal.  Please leave a message.  I did not leave a message since it was not the same person who called, and it was a generic vm.

## 2014-08-09 ENCOUNTER — Telehealth: Payer: Self-pay | Admitting: Neurology

## 2014-08-09 NOTE — Telephone Encounter (Signed)
I spoke with Joni Reining at [facility].  She said no PA is required for Gilenya.  Says the pahrmacy should have called them to get an override for an internal rejection.  States nothing further is needed on our end.  I called the number taken by operator again.  Got no answer.  Same message said VM was for Valora Corporal.

## 2014-08-09 NOTE — Telephone Encounter (Signed)
Last OV note says: She has been having an increase in headaches since Dr. Jacinto Halim had stopped Propranolol for Gilenya start-up, he has advised her to go back on it.  I called back.  Spoke with Crystal.  They will notate the file and call back if anything further is needed.

## 2014-08-09 NOTE — Telephone Encounter (Signed)
Sid with [facility] is calling.  He states that the patient's gilenya 0.5 might interfere with the propranolol 40 mg. Please call.

## 2014-08-29 ENCOUNTER — Ambulatory Visit: Payer: Medicaid Other | Admitting: Neurology

## 2014-08-31 ENCOUNTER — Encounter: Payer: Self-pay | Admitting: Neurology

## 2014-08-31 ENCOUNTER — Ambulatory Visit (INDEPENDENT_AMBULATORY_CARE_PROVIDER_SITE_OTHER): Payer: Medicaid Other | Admitting: Neurology

## 2014-08-31 VITALS — BP 136/88 | HR 60 | Ht 63.0 in | Wt 208.0 lb

## 2014-08-31 DIAGNOSIS — E538 Deficiency of other specified B group vitamins: Secondary | ICD-10-CM

## 2014-08-31 DIAGNOSIS — R5382 Chronic fatigue, unspecified: Secondary | ICD-10-CM | POA: Diagnosis not present

## 2014-08-31 DIAGNOSIS — G35 Multiple sclerosis: Secondary | ICD-10-CM

## 2014-08-31 DIAGNOSIS — F418 Other specified anxiety disorders: Secondary | ICD-10-CM

## 2014-08-31 HISTORY — DX: Chronic fatigue, unspecified: R53.82

## 2014-08-31 HISTORY — DX: Deficiency of other specified B group vitamins: E53.8

## 2014-08-31 MED ORDER — SUMATRIPTAN 20 MG/ACT NA SOLN: 20.0000 mg | NASAL | Status: DC | PRN

## 2014-08-31 NOTE — Progress Notes (Signed)
Reason for visit: Multiple sclerosis  Sue Sanchez is an 44 y.o. female  History of present illness:  Sue Sanchez is a 44 year old right-handed white female with a history of multiple sclerosis. The patient indicates that she has had some increasing problems with fatigue, and sensation of leg weakness. The patient believes that she snores at night. The patient has some gait instability, she will fall on occasion. She has had episodes of brief vertigo. The patient is on Lexapro. She has been under a lot of stress with her daughter recently. She indicates that she does snore at night, and she has had increased daytime drowsiness. She has been diagnosed with COPD as well. She last had MRI evaluation of the brain done 1 year ago, this study showed new lesions in the brain. The patient has been placed on Gilenya, and she is tolerating the medication fairly well. She continues to have headaches occurring once a week or so. The patient has Relpax to take, but this upsets her stomach, and she does not usually take the medication for the headache. The patient has not tried Imitrex, and she could not tolerate Maxalt previously. The patient returns to this office for an evaluation.  Past Medical History  Diagnosis Date  . Migraine   . Obesity   . Gastroesophageal reflux disease   . Numbness     bilateral lower extremity  . MS (multiple sclerosis)   . Osteoarthritis     diagnosed by orthopedics  . COPD (chronic obstructive pulmonary disease)   . Chronic fatigue 08/31/2014  . B12 deficiency 08/31/2014  . Vitamin D deficiency     Past Surgical History  Procedure Laterality Date  . Cesarean section    . Foot surgery      Club feet  . Cosmetic surgery      lower right leg  . Tubal ligation Bilateral     Family History  Problem Relation Age of Onset  . Diabetes Mother   . Bipolar disorder Sister   . Bipolar disorder Brother   . Cancer Maternal Grandmother   . COPD Paternal  Grandfather   . Heart disease Paternal Grandfather     Social history:  reports that she has been smoking Cigarettes.  She has a 15 pack-year smoking history. She does not have any smokeless tobacco history on file. She reports that she does not drink alcohol or use illicit drugs.   No Known Allergies  Medications:  Prior to Admission medications   Medication Sig Start Date End Date Taking? Authorizing Provider  budesonide-formoterol (SYMBICORT) 160-4.5 MCG/ACT inhaler Inhale 2 puffs into the lungs 2 (two) times daily.   Yes Historical Provider, MD  escitalopram (LEXAPRO) 10 MG tablet Take 1 tablet (10 mg total) by mouth daily. 02/28/14  Yes Ronal Fear, NP  Fingolimod HCl (GILENYA) 0.5 MG CAPS Take 1 capsule (0.5 mg total) by mouth daily. 07/09/13  Yes York Spaniel, MD  pantoprazole (PROTONIX) 40 MG tablet Take 40 mg by mouth daily.   Yes Historical Provider, MD  propranolol (INDERAL) 40 MG tablet TAKE 1 TABLET BY MOUTH TWICE A DAY 07/01/14  Yes York Spaniel, MD  PROVENTIL HFA 108 [id number] BASE) MCG/ACT inhaler  12/28/12  Yes Historical Provider, MD  ranitidine (ZANTAC) 300 MG tablet Take 300 mg by mouth as needed. 12/04/13  Yes Historical Provider, MD    ROS:  Out of a complete 14 system review of symptoms, the patient complains only of the following symptoms,  and all other reviewed systems are negative.  Fatigue Ringing in the ears, runny nose, difficulty swallowing Eye pain Cough, wheezing, shortness of breath, choking Heat intolerance, flushing Constipation, diarrhea Insomnia, frequent waking, daytime sleepiness, snoring Frequent infections Incontinence of bladder, frequency of urination Joint pain, back pain, achy muscles, muscle cramps, walking difficulty, neck pain, neck stiffness Itching Memory loss, dizziness, headache, numbness, weakness Agitation, decreased concentration, depression, anxiety  Blood pressure 136/88, pulse 60, height  (1.6 m), weight 208 lb (94.348  kg).  Physical Exam  General: The patient is alert and cooperative at the time of the examination. The patient is moderately obese.  Skin: No significant peripheral edema is noted.   Neurologic Exam  Mental status: The patient is alert and oriented x 3 at the time of the examination. The patient has apparent normal recent and remote memory, with an apparently normal attention span and concentration ability.   Cranial nerves: Facial symmetry is present. Speech is normal, no aphasia or dysarthria is noted. Extraocular movements are full. Visual fields are full. Pupils are equal, round, and reactive to light. Discs are flat bilaterally.  Motor: The patient has good strength in all 4 extremities.  Sensory examination: Soft touch sensation is symmetric on the face, arms, and legs.  Coordination: The patient has good finger-nose-finger and heel-to-shin bilaterally.  Gait and station: The patient has a normal gait. Tandem gait is slightly unsteady. Romberg is negative. No drift is seen.  Reflexes: Deep tendon reflexes are symmetric.   Assessment/Plan:  1. Multiple sclerosis  2. Chronic fatigue  3. Migraine headache  4. Vitamin B12 deficiency  The patient continues to have migraine headache that begins around the left eye, generalizes, and she may have neck stiffness for 1 or 2 days afterwards. The patient is unable to take Relpax due to stomach upset. I will try Imitrex nasal spray for her. The patient had active disease activity on MRI the brain done 1 year ago, we will repeat MRI evaluation the brain and cervical spinal cord while on Gilenya. Blood work will be done today. The patient has vitamin B12 deficiency, she is not on supplementation, she is to go back on 1000 g B12 tablets. She will follow-up in 6 months.  Marlan Palau MD 08/31/2014 7:34 PM  [facility] [address] [place], [place] [address]  Phone [phone] Fax  [phone]

## 2014-08-31 NOTE — Patient Instructions (Signed)

## 2014-09-01 LAB — CBC WITH DIFFERENTIAL/PLATELET
BASOS ABS: 0 10*3/uL (ref 0.0–0.2)
BASOS: 0 %
EOS (ABSOLUTE): 0.3 10*3/uL (ref 0.0–0.4)
Eos: 5 %
HEMATOCRIT: 43.6 % (ref 34.0–46.6)
HEMOGLOBIN: 14.6 g/dL (ref 11.1–15.9)
Immature Grans (Abs): 0 10*3/uL (ref 0.0–0.1)
Immature Granulocytes: 0 %
Lymphocytes Absolute: 0.4 10*3/uL — ABNORMAL LOW (ref 0.7–3.1)
Lymphs: 8 %
MCH: 30.4 pg (ref 26.6–33.0)
MCHC: 33.5 g/dL (ref 31.5–35.7)
MCV: 91 fL (ref 79–97)
MONOCYTES: 14 %
Monocytes Absolute: 0.8 10*3/uL (ref 0.1–0.9)
NEUTROS ABS: 4 10*3/uL (ref 1.4–7.0)
Neutrophils: 73 %
Platelets: 316 10*3/uL (ref 150–379)
RBC: 4.81 x10E6/uL (ref 3.77–5.28)
RDW: 13.6 % (ref 12.3–15.4)
WBC: 5.4 10*3/uL (ref 3.4–10.8)

## 2014-09-01 LAB — VITAMIN B12: Vitamin B-12: 409 pg/mL (ref 211–946)

## 2014-09-01 LAB — COMPREHENSIVE METABOLIC PANEL
ALBUMIN: 3.8 g/dL (ref 3.5–5.5)
ALT: 27 IU/L (ref 0–32)
AST: 21 IU/L (ref 0–40)
Albumin/Globulin Ratio: 1.5 (ref 1.1–2.5)
Alkaline Phosphatase: 123 IU/L — ABNORMAL HIGH (ref 39–117)
BUN/Creatinine Ratio: 9 (ref 9–23)
BUN: 8 mg/dL (ref 6–24)
Bilirubin Total: 0.5 mg/dL (ref 0.0–1.2)
CHLORIDE: 102 mmol/L (ref 97–108)
CO2: 23 mmol/L (ref 18–29)
CREATININE: 0.86 mg/dL (ref 0.57–1.00)
Calcium: 9.1 mg/dL (ref 8.7–10.2)
GFR calc non Af Amer: 82 mL/min/{1.73_m2} (ref >59)
GFR, EST AFRICAN AMERICAN: 94 mL/min/{1.73_m2} (ref >59)
GLUCOSE: 84 mg/dL (ref 65–99)
Globulin, Total: 2.5 g/dL (ref 1.5–4.5)
Potassium: 4.4 mmol/L (ref 3.5–5.2)
Sodium: 140 mmol/L (ref 134–144)
TOTAL PROTEIN: 6.3 g/dL (ref 6.0–8.5)

## 2014-09-08 ENCOUNTER — Ambulatory Visit
Admission: RE | Admit: 2014-09-08 | Discharge: 2014-09-08 | Disposition: A | Discharge status: Home / Self Care | Payer: Medicaid Other | Source: Ambulatory Visit | Attending: Neurology | Admitting: Neurology

## 2014-09-08 ENCOUNTER — Encounter (INDEPENDENT_AMBULATORY_CARE_PROVIDER_SITE_OTHER): Payer: Medicaid Other | Admitting: Neurology

## 2014-09-08 DIAGNOSIS — R5382 Chronic fatigue, unspecified: Secondary | ICD-10-CM | POA: Diagnosis not present

## 2014-09-08 DIAGNOSIS — G35D Multiple sclerosis, unspecified: Secondary | ICD-10-CM

## 2014-09-08 DIAGNOSIS — G35 Multiple sclerosis: Secondary | ICD-10-CM

## 2014-09-08 DIAGNOSIS — F418 Other specified anxiety disorders: Secondary | ICD-10-CM

## 2014-09-08 DIAGNOSIS — E538 Deficiency of other specified B group vitamins: Secondary | ICD-10-CM

## 2014-09-08 MED ORDER — GADOBENATE DIMEGLUMINE 529 MG/ML IV SOLN
15.0000 mL | Freq: Once | INTRAVENOUS | Status: AC | PRN
  Administered 2014-09-08: 15 mL via INTRAVENOUS

## 2014-09-12 ENCOUNTER — Telehealth: Payer: Self-pay | Admitting: Neurology

## 2014-09-12 NOTE — Telephone Encounter (Signed)
I called the patient. No disease in the cervical cord, MRI brain with minimal disease, stable.   09/11/14 MRI cervical:  IMPRESSION: This is an abnormal MRI of the cervical spine show the following: 1. Mild spinal stenosis at C3-C4 due to left paramedian disc protrusion and left greater than right uncovertebral spurring. There is no spinal cord compression. There does not appear to be nerve root compression at this level. 2. Milder degenerative changes at C2-C3, C4-C5, C5-C6 and C6-C7 that do not lead to nerve root compression. 3. The spinal cord appears normal before and after contrast administration.   MRI brain 09/11/14:  IMPRESSION: This is an abnormal MRI of the brain with and without contrast showing a few periventricular and juxtacortical T2/flair hyperintense foci. Although somewhat nonspecific, the pattern is consistent with multiple sclerosis. Small vessel ischemic changes, changes from migraine and sequela of remote infection, inflammation or trauma are also possible causes. There were no enhancing foci.

## 2014-09-22 DIAGNOSIS — Z0289 Encounter for other administrative examinations: Secondary | ICD-10-CM

## 2014-09-27 ENCOUNTER — Other Ambulatory Visit: Payer: Self-pay | Admitting: Neurology

## 2014-09-27 NOTE — Telephone Encounter (Signed)
Prescribed at OV in Nov

## 2014-10-05 DIAGNOSIS — Z0289 Encounter for other administrative examinations: Secondary | ICD-10-CM

## 2014-11-26 ENCOUNTER — Telehealth: Payer: Self-pay | Admitting: Neurology

## 2014-11-26 NOTE — Telephone Encounter (Signed)
Patient paged the on call. She has 2 Gilenya pills left. She was supposed to have more delivered on this coming Tuesday(16th) but insurance declined this shipment because they say she already received it on the 6th of this month - but she denies that she received anything on the 6th. Insurance won't pay for Gilenya again until the 29th so she will be without Gilenya for a few weeks. She has enough until Monday. Please advise thanks

## 2014-11-27 NOTE — Telephone Encounter (Signed)
I called the patient. The patient last got a Gilenya prescription on July 13. She will be out of the medication that week. She has called Medicaid that indicate that they have already been filled for the medication this month, they are not going to send of a more medication. The patient has not received the drug. We will need to determine what has happened, and if we can get the patient more medication ASAP.

## 2014-11-28 NOTE — Telephone Encounter (Signed)
I called the [name], however, they are currently closed.  I contacted Gena Fray with Gilenya as well to inquire about samples for this patient.  Awaiting response.

## 2014-11-28 NOTE — Telephone Encounter (Signed)
I called back.  Verified order verbally.  They will proceed with request and call us back if anything further is needed.

## 2014-11-28 NOTE — Telephone Encounter (Addendum)
I [facility].  Spoke with Nicholos Johns, who was not able to assist, and asked me to call [phone].  I called this number and spoke with [facility].  She said the patient is no longer active in their system.  I called the patient back.  She says she now uses [facility], phone [phone].  I called this number and spoke with Mimi, who was not able to assist me.  I was then transferred to [facility].  She looked at the file and said the Rx was rejecting refill too soon.  I explained the patient indicates she did not receive the shipment, and will be taking her last pill today.  She is going to look further into this, and contact the patient.  She will see what they can do to get a new order overridden and shipped.  They will call back if anything further is needed from [place].    We were able to obtain samples to bridge the patient until her next order is shipped.  Casandra placed them at the front desk.  I spoke with the patient.  She expressed understanding and appreciation.

## 2014-11-28 NOTE — Telephone Encounter (Signed)
[name]/ [name] [phone] called and advised that they can get a temporary RX for Gilenya but they need the order . , 14 day supply, 3 refills

## 2014-12-26 ENCOUNTER — Telehealth: Payer: Self-pay | Admitting: Neurology

## 2014-12-26 NOTE — Telephone Encounter (Signed)
This patient has recently been seen through her ophthalmologist, Dr. Dione Booze. Examination was unremarkable. No evidence of macular disease.

## 2015-01-05 ENCOUNTER — Other Ambulatory Visit: Payer: Self-pay | Admitting: Neurology

## 2015-02-21 ENCOUNTER — Telehealth: Payer: Self-pay | Admitting: Neurology

## 2015-02-21 MED ORDER — FINGOLIMOD HCL 0.5 MG PO CAPS: 0.5000 mg | ORAL_CAPSULE | Freq: Every day | ORAL | Status: DC

## 2015-02-21 NOTE — Telephone Encounter (Signed)
Daniel with [facility] requesting refill for Gilenya. Phone #[phone]

## 2015-02-21 NOTE — Telephone Encounter (Signed)
Rx has been provided.  

## 2015-03-03 ENCOUNTER — Encounter: Payer: Self-pay | Admitting: Neurology

## 2015-03-03 ENCOUNTER — Ambulatory Visit (INDEPENDENT_AMBULATORY_CARE_PROVIDER_SITE_OTHER): Payer: Medicaid Other | Admitting: Neurology

## 2015-03-03 ENCOUNTER — Telehealth: Payer: Self-pay | Admitting: Neurology

## 2015-03-03 VITALS — BP 134/94 | HR 66 | Ht 63.0 in | Wt 201.0 lb

## 2015-03-03 DIAGNOSIS — G35 Multiple sclerosis: Secondary | ICD-10-CM | POA: Diagnosis not present

## 2015-03-03 DIAGNOSIS — R269 Unspecified abnormalities of gait and mobility: Secondary | ICD-10-CM

## 2015-03-03 DIAGNOSIS — Z5181 Encounter for therapeutic drug level monitoring: Secondary | ICD-10-CM | POA: Diagnosis not present

## 2015-03-03 HISTORY — DX: Unspecified abnormalities of gait and mobility: R26.9

## 2015-03-03 NOTE — Telephone Encounter (Signed)
Patient called to advise she is running late for 9:00am appointment this morning but will be here in 30 minutes (8:55am)

## 2015-03-03 NOTE — Progress Notes (Signed)
Reason for visit:  Multiple sclerosis  Sue Sanchez is an 44 y.o. female  History of present illness:   Sue Sanchez is a 44 year old right-handed white female with a history of multiple sclerosis. The patient is on Gilenya, she is tolerating the medication well. She reports no definite new MS symptoms since last seen. She does have right knee discomfort, she occasionally will have problems with her knee collapsing as she is going upstairs. The patient does have some gait instability, she will fall on occasion. She has had some occasional urinary incontinence in the past, this has improved recently. She has chronic fatigue that is quite significant. She denies any new numbness, weakness, or visual changes. She returns for an evaluation. She has been seen by Dr. Dione Booze recently for an ophthalmologic evaluation that was unremarkable. MRI evaluation done recently of the brain and cervical cord showed good stability.  Past Medical History  Diagnosis Date  . Migraine   . Obesity   . Gastroesophageal reflux disease   . Numbness     bilateral lower extremity  . MS (multiple sclerosis) (HCC)   . Osteoarthritis     diagnosed by orthopedics  . COPD (chronic obstructive pulmonary disease) (HCC)   . Chronic fatigue 08/31/2014  . B12 deficiency 08/31/2014  . Vitamin D deficiency   . Abnormality of gait 03/03/2015    Past Surgical History  Procedure Laterality Date  . Cesarean section    . Foot surgery      Club feet  . Cosmetic surgery      lower right leg  . Tubal ligation Bilateral     Family History  Problem Relation Age of Onset  . Diabetes Mother   . Bipolar disorder Sister   . Bipolar disorder Brother   . Cancer Maternal Grandmother   . COPD Paternal Grandfather   . Heart disease Paternal Grandfather     Social history:  reports that she has been smoking Cigarettes.  She has a 30 pack-year smoking history. She has never used smokeless tobacco. She reports that she  does not drink alcohol or use illicit drugs.   No Known Allergies  Medications:  Prior to Admission medications   Medication Sig Start Date End Date Taking? Authorizing Provider  amoxicillin-clavulanate (AUGMENTIN) 250-125 MG tablet Take 1 tablet by mouth 2 (two) times daily.   Yes Historical Provider, MD  budesonide-formoterol (SYMBICORT) 160-4.5 MCG/ACT inhaler Inhale 2 puffs into the lungs 2 (two) times daily.   Yes Historical Provider, MD  ergocalciferol (VITAMIN D2) 50000 UNITS capsule Take 50,000 Units by mouth 2 (two) times a week.   Yes Historical Provider, MD  escitalopram (LEXAPRO) 10 MG tablet TAKE 1 TABLET (10 MG TOTAL) BY MOUTH DAILY. 09/27/14  Yes York Spaniel, MD  Fingolimod HCl (GILENYA) 0.5 MG CAPS Take 1 capsule (0.5 mg total) by mouth daily. 02/21/15  Yes York Spaniel, MD  pantoprazole (PROTONIX) 40 MG tablet Take 40 mg by mouth daily.   Yes Historical Provider, MD  propranolol (INDERAL) 40 MG tablet TAKE 1 TABLET BY MOUTH TWICE A DAY 01/05/15  Yes York Spaniel, MD  PROVENTIL HFA [phone] BASE) MCG/ACT inhaler  12/28/12  Yes Historical Provider, MD  ranitidine (ZANTAC) 300 MG tablet Take 300 mg by mouth as needed. 12/04/13  Yes Historical Provider, MD  SUMAtriptan (IMITREX) 20 MG/ACT nasal spray Place 1 spray (20 mg total) into the nose every 2 (two) hours as needed for migraine or headache. 08/31/14  Yes York Spaniel, MD  tiZANidine (ZANAFLEX) 2 MG tablet Take 2 mg by mouth every 6 (six) hours as needed for muscle spasms.   Yes Historical Provider, MD    ROS:  Out of a complete 14 system review of symptoms, the patient complains only of the following symptoms, and all other reviewed systems are negative.   Activity change, fatigue  Blurred vision  Cough, wheezing, shortness of breath, choking, chest tightness  Heat intolerance  Constipation  Daytime sleepiness  Joint pain, back pain, achy muscles, muscle cramps, walking difficulty   Skin wounds , itching   Bruising easily  Memory loss, numbness  Agitation, confusion, depression  Blood pressure 134/94, pulse 66, height 5\' 3"  (1.6 m), weight 201 lb (91.173 kg).  Physical Exam  General: The patient is alert and cooperative at the time of the examination. The patient is moderately obese.  Skin: No significant peripheral edema is noted.   Neurologic Exam  Mental status: The patient is alert and oriented x 3 at the time of the examination. The patient has apparent normal recent and remote memory, with an apparently normal attention span and concentration ability.   Cranial nerves: Facial symmetry is present. Speech is normal, no aphasia or dysarthria is noted. Extraocular movements are full. Visual fields are full. Pupils are equal, round, and reactive to light.  Motor: The patient has good strength in all 4 extremities.  Sensory examination: Soft touch sensation is symmetric on the face, arms, and legs.  Coordination: The patient has good finger-nose-finger and heel-to-shin bilaterally.  Gait and station: The patient has a normal gait. Tandem gait is  unsteady. Romberg is positive, the patient tends to go backwards. No drift is seen.  Reflexes: Deep tendon reflexes are symmetric.   09/11/14 MRI cervical:  IMPRESSION: This is an abnormal MRI of the cervical spine show the following: 1. Mild spinal stenosis at C3-C4 due to left paramedian disc protrusion and left greater than right uncovertebral spurring. There is no spinal cord compression. There does not appear to be nerve root compression at this level. 2. Milder degenerative changes at C2-C3, C4-C5, C5-C6 and C6-C7 that do not lead to nerve root compression. 3. The spinal cord appears normal before and after contrast administration.   MRI brain 09/11/14:  IMPRESSION: This is an abnormal MRI of the brain with and without contrast showing a few periventricular and juxtacortical T2/flair hyperintense foci. Although somewhat  nonspecific, the pattern is consistent with multiple sclerosis. Small vessel ischemic changes, changes from migraine and sequela of remote infection, inflammation or trauma are also possible causes. There were no enhancing foci.  * MRI scan images were reviewed online. I agree with the written report.    Assessment/Plan:   1. Multiple sclerosis   2. Gait disturbance   3. Chronic fatigue   The patient is doing relatively well on the Gilenya. MRI evaluations of the brain and cervical spine done recently were stable. We will continue medication for now, blood work will be done. She will follow-up in 6 months.  Marlan Palau MD 03/03/2015 8:11 PM  [facility] [address] [place], [place] [address]  Phone [phone] Fax [phone]

## 2015-03-03 NOTE — Patient Instructions (Signed)
Fall Prevention in the Home  Falls can cause injuries and can affect people from all age groups. There are many simple things that you can do to make your home safe and to help prevent falls. WHAT CAN I DO ON THE OUTSIDE OF MY HOME?  Regularly repair the edges of walkways and driveways and fix any cracks.  Remove high doorway thresholds.  Trim any shrubbery on the main path into your home.  Use bright outdoor lighting.  Clear walkways of debris and clutter, including tools and rocks.  Regularly check that handrails are securely fastened and in good repair. Both sides of any steps should have handrails.  Install guardrails along the edges of any raised decks or porches.  Have leaves, snow, and ice cleared regularly.  Use sand or salt on walkways during winter months.  In the garage, clean up any spills right away, including grease or oil spills. WHAT CAN I DO IN THE BATHROOM?  Use night lights.  Install grab bars by the toilet and in the tub and shower. Do not use towel bars as grab bars.  Use non-skid mats or decals on the floor of the tub or shower.  If you need to sit down while you are in the shower, use a plastic, non-slip stool..  Keep the floor dry. Immediately clean up any water that spills on the floor.  Remove soap buildup in the tub or shower on a regular basis.  Attach bath mats securely with double-sided non-slip rug tape.  Remove throw rugs and other tripping hazards from the floor. WHAT CAN I DO IN THE BEDROOM?  Use night lights.  Make sure that a bedside light is easy to reach.  Do not use oversized bedding that drapes onto the floor.  Have a firm chair that has side arms to use for getting dressed.  Remove throw rugs and other tripping hazards from the floor. WHAT CAN I DO IN THE KITCHEN?   Clean up any spills right away.  Avoid walking on wet floors.  Place frequently used items in easy-to-reach places.  If you need to reach for something  above you, use a sturdy step stool that has a grab bar.  Keep electrical cables out of the way.  Do not use floor polish or wax that makes floors slippery. If you have to use wax, make sure that it is non-skid floor wax.  Remove throw rugs and other tripping hazards from the floor. WHAT CAN I DO IN THE STAIRWAYS?  Do not leave any items on the stairs.  Make sure that there are handrails on both sides of the stairs. Fix handrails that are broken or loose. Make sure that handrails are as long as the stairways.  Check any carpeting to make sure that it is firmly attached to the stairs. Fix any carpet that is loose or worn.  Avoid having throw rugs at the top or bottom of stairways, or secure the rugs with carpet tape to prevent them from moving.  Make sure that you have a light switch at the top of the stairs and the bottom of the stairs. If you do not have them, have them installed. WHAT ARE SOME OTHER FALL PREVENTION TIPS?  Wear closed-toe shoes that fit well and support your feet. Wear shoes that have rubber soles or low heels.  When you use a stepladder, make sure that it is completely opened and that the sides are firmly locked. Have someone hold the ladder while you   are using it. Do not climb a closed stepladder.  Add color or contrast paint or tape to grab bars and handrails in your home. Place contrasting color strips on the first and last steps.  Use mobility aids as needed, such as canes, walkers, scooters, and crutches.  Turn on lights if it is dark. Replace any light bulbs that burn out.  Set up furniture so that there are clear paths. Keep the furniture in the same spot.  Fix any uneven floor surfaces.  Choose a carpet design that does not hide the edge of steps of a stairway.  Be aware of any and all pets.  Review your medicines with your healthcare provider. Some medicines can cause dizziness or changes in blood pressure, which increase your risk of falling. Talk  with your health care provider about other ways that you can decrease your risk of falls. This may include working with a physical therapist or trainer to improve your strength, balance, and endurance.   This information is not intended to replace advice given to you by your health care provider. Make sure you discuss any questions you have with your health care provider.   Document Released: 03/22/2002 Document Revised: 08/16/2014 Document Reviewed: 05/06/2014 Elsevier Interactive Patient Education 2016 [employer]  

## 2015-03-04 LAB — CBC WITH DIFFERENTIAL/PLATELET
BASOS ABS: 0 10*3/uL (ref 0.0–0.2)
Basos: 1 %
EOS (ABSOLUTE): 0.3 10*3/uL (ref 0.0–0.4)
Eos: 5 %
HEMOGLOBIN: 15.4 g/dL (ref 11.1–15.9)
Hematocrit: 45.1 % (ref 34.0–46.6)
Immature Grans (Abs): 0 10*3/uL (ref 0.0–0.1)
Immature Granulocytes: 0 %
LYMPHS ABS: 0.3 10*3/uL — AB (ref 0.7–3.1)
Lymphs: 4 %
MCH: 30.6 pg (ref 26.6–33.0)
MCHC: 34.1 g/dL (ref 31.5–35.7)
MCV: 90 fL (ref 79–97)
MONOCYTES: 12 %
Monocytes Absolute: 0.8 10*3/uL (ref 0.1–0.9)
NEUTROS ABS: 5.1 10*3/uL (ref 1.4–7.0)
Neutrophils: 78 %
Platelets: 325 10*3/uL (ref 150–379)
RBC: 5.04 x10E6/uL (ref 3.77–5.28)
RDW: 13.5 % (ref 12.3–15.4)
WBC: 6.5 10*3/uL (ref 3.4–10.8)

## 2015-03-04 LAB — COMPREHENSIVE METABOLIC PANEL
ALBUMIN: 4.1 g/dL (ref 3.5–5.5)
ALT: 29 IU/L (ref 0–32)
AST: 22 IU/L (ref 0–40)
Albumin/Globulin Ratio: 1.6 (ref 1.1–2.5)
Alkaline Phosphatase: 127 IU/L — ABNORMAL HIGH (ref 39–117)
BUN / CREAT RATIO: 7 — AB (ref 9–23)
BUN: 7 mg/dL (ref 6–24)
Bilirubin Total: 0.4 mg/dL (ref 0.0–1.2)
CHLORIDE: 102 mmol/L (ref 97–106)
CO2: 24 mmol/L (ref 18–29)
Calcium: 9.6 mg/dL (ref 8.7–10.2)
Creatinine, Ser: 0.96 mg/dL (ref 0.57–1.00)
GFR calc non Af Amer: 71 mL/min/{1.73_m2} (ref >59)
GFR, EST AFRICAN AMERICAN: 82 mL/min/{1.73_m2} (ref >59)
Globulin, Total: 2.5 g/dL (ref 1.5–4.5)
Glucose: 84 mg/dL (ref 65–99)
POTASSIUM: 4.5 mmol/L (ref 3.5–5.2)
Sodium: 141 mmol/L (ref 136–144)
TOTAL PROTEIN: 6.6 g/dL (ref 6.0–8.5)

## 2015-04-24 ENCOUNTER — Emergency Department (HOSPITAL_COMMUNITY)
Admission: EM | Admit: 2015-04-24 | Discharge: 2015-04-24 | Disposition: A | Discharge status: Home / Self Care | Payer: Medicaid Other | Attending: Emergency Medicine | Admitting: Emergency Medicine

## 2015-04-24 ENCOUNTER — Encounter (HOSPITAL_COMMUNITY): Payer: Self-pay

## 2015-04-24 ENCOUNTER — Emergency Department (HOSPITAL_COMMUNITY): Payer: Medicaid Other

## 2015-04-24 DIAGNOSIS — E669 Obesity, unspecified: Secondary | ICD-10-CM | POA: Insufficient documentation

## 2015-04-24 DIAGNOSIS — M1990 Unspecified osteoarthritis, unspecified site: Secondary | ICD-10-CM | POA: Insufficient documentation

## 2015-04-24 DIAGNOSIS — K219 Gastro-esophageal reflux disease without esophagitis: Secondary | ICD-10-CM | POA: Insufficient documentation

## 2015-04-24 DIAGNOSIS — K5900 Constipation, unspecified: Secondary | ICD-10-CM | POA: Diagnosis not present

## 2015-04-24 DIAGNOSIS — M5441 Lumbago with sciatica, right side: Secondary | ICD-10-CM | POA: Insufficient documentation

## 2015-04-24 DIAGNOSIS — Z7951 Long term (current) use of inhaled steroids: Secondary | ICD-10-CM | POA: Insufficient documentation

## 2015-04-24 DIAGNOSIS — F17210 Nicotine dependence, cigarettes, uncomplicated: Secondary | ICD-10-CM | POA: Insufficient documentation

## 2015-04-24 DIAGNOSIS — Z79899 Other long term (current) drug therapy: Secondary | ICD-10-CM | POA: Insufficient documentation

## 2015-04-24 DIAGNOSIS — Z3202 Encounter for pregnancy test, result negative: Secondary | ICD-10-CM | POA: Diagnosis not present

## 2015-04-24 DIAGNOSIS — G43909 Migraine, unspecified, not intractable, without status migrainosus: Secondary | ICD-10-CM | POA: Diagnosis not present

## 2015-04-24 DIAGNOSIS — E559 Vitamin D deficiency, unspecified: Secondary | ICD-10-CM | POA: Diagnosis not present

## 2015-04-24 DIAGNOSIS — J449 Chronic obstructive pulmonary disease, unspecified: Secondary | ICD-10-CM | POA: Insufficient documentation

## 2015-04-24 DIAGNOSIS — M545 Low back pain: Secondary | ICD-10-CM | POA: Diagnosis present

## 2015-04-24 LAB — URINALYSIS, ROUTINE W REFLEX MICROSCOPIC
Bilirubin Urine: NEGATIVE
GLUCOSE, UA: NEGATIVE mg/dL
Ketones, ur: NEGATIVE mg/dL
LEUKOCYTES UA: NEGATIVE
Nitrite: NEGATIVE
PROTEIN: NEGATIVE mg/dL
SPECIFIC GRAVITY, URINE: 1.02 (ref 1.005–1.030)
pH: 5.5 (ref 5.0–8.0)

## 2015-04-24 LAB — URINE MICROSCOPIC-ADD ON

## 2015-04-24 LAB — PREGNANCY, URINE: Preg Test, Ur: NEGATIVE

## 2015-04-24 MED ORDER — METHOCARBAMOL 500 MG PO TABS: 1000.0000 mg | ORAL_TABLET | Freq: Four times a day (QID) | ORAL | Status: DC | PRN

## 2015-04-24 MED ORDER — HYDROCODONE-ACETAMINOPHEN 5-325 MG PO TABS
1.0000 | ORAL_TABLET | Freq: Once | ORAL | Status: AC
  Administered 2015-04-24: 1 via ORAL
  Filled 2015-04-24: qty 1

## 2015-04-24 MED ORDER — HYDROCODONE-ACETAMINOPHEN 5-325 MG PO TABS: 1.0000 | ORAL_TABLET | ORAL | Status: DC | PRN

## 2015-04-24 MED ORDER — DOCUSATE SODIUM 100 MG PO CAPS: 100.0000 mg | ORAL_CAPSULE | Freq: Two times a day (BID) | ORAL | Status: DC

## 2015-04-24 MED ORDER — IBUPROFEN 200 MG PO TABS: 600.0000 mg | ORAL_TABLET | Freq: Four times a day (QID) | ORAL | Status: AC | PRN

## 2015-04-24 MED ORDER — METHOCARBAMOL 500 MG PO TABS
1000.0000 mg | ORAL_TABLET | Freq: Once | ORAL | Status: AC
  Administered 2015-04-24: 1000 mg via ORAL
  Filled 2015-04-24: qty 2

## 2015-04-24 MED ORDER — BENZONATATE 100 MG PO CAPS: 100.0000 mg | ORAL_CAPSULE | Freq: Three times a day (TID) | ORAL | Status: DC | PRN

## 2015-04-24 NOTE — ED Notes (Signed)
Pt ambulated to the restroom in attempt to collect urine sample.  

## 2015-04-24 NOTE — ED Notes (Addendum)
Pt c/o constipation x 3-4 days, low back pain, bilateral hip pain, and tingling in R leg x 2 days, and nausea last night.  Pain score 9/10.  Pt has not been taking anything for constipation.  Denies injury.  Hx of MS.  Pt reports that she has also been dealing w/ a cold.

## 2015-04-24 NOTE — Discharge Instructions (Signed)
Sciatica °Sciatica is pain, weakness, numbness, or tingling along the path of the sciatic nerve. The nerve starts in the lower back and runs down the back of each leg. The nerve controls the muscles in the lower leg and in the back of the knee, while also providing sensation to the back of the thigh, lower leg, and the sole of your foot. Sciatica is a symptom of another medical condition. For instance, nerve damage or certain conditions, such as a herniated disk or bone spur on the spine, pinch or put pressure on the sciatic nerve. This causes the pain, weakness, or other sensations normally associated with sciatica. Generally, sciatica only affects one side of the body. °CAUSES  °· Herniated or slipped disc. °· Degenerative disk disease. °· A pain disorder involving the narrow muscle in the buttocks (piriformis syndrome). °· Pelvic injury or fracture. °· Pregnancy. °· Tumor (rare). °SYMPTOMS  °Symptoms can vary from mild to very severe. The symptoms usually travel from the low back to the buttocks and down the back of the leg. Symptoms can include: °· Mild tingling or dull aches in the lower back, leg, or hip. °· Numbness in the back of the calf or sole of the foot. °· Burning sensations in the lower back, leg, or hip. °· Sharp pains in the lower back, leg, or hip. °· Leg weakness. °· Severe back pain inhibiting movement. °These symptoms may get worse with coughing, sneezing, laughing, or prolonged sitting or standing. Also, being overweight may worsen symptoms. °DIAGNOSIS  °Your caregiver will perform a physical exam to look for common symptoms of sciatica. He or she may ask you to do certain movements or activities that would trigger sciatic nerve pain. Other tests may be performed to find the cause of the sciatica. These may include: °· Blood tests. °· X-rays. °· Imaging tests, such as an MRI or CT scan. °TREATMENT  °Treatment is directed at the cause of the sciatic pain. Sometimes, treatment is not necessary  and the pain and discomfort goes away on its own. If treatment is needed, your caregiver may suggest: °· Over-the-counter medicines to relieve pain. °· Prescription medicines, such as anti-inflammatory medicine, muscle relaxants, or narcotics. °· Applying heat or ice to the painful area. °· Steroid injections to lessen pain, irritation, and inflammation around the nerve. °· Reducing activity during periods of pain. °· Exercising and stretching to strengthen your abdomen and improve flexibility of your spine. Your caregiver may suggest losing weight if the extra weight makes the back pain worse. °· Physical therapy. °· Surgery to eliminate what is pressing or pinching the nerve, such as a bone spur or part of a herniated disk. °HOME CARE INSTRUCTIONS  °· Only take over-the-counter or prescription medicines for pain or discomfort as directed by your caregiver. °· Apply ice to the affected area for 20 minutes, 3-4 times a day for the first 48-72 hours. Then try heat in the same way. °· Exercise, stretch, or perform your usual activities if these do not aggravate your pain. °· Attend physical therapy sessions as directed by your caregiver. °· Keep all follow-up appointments as directed by your caregiver. °· Do not wear high heels or shoes that do not provide proper support. °· Check your mattress to see if it is too soft. A firm mattress may lessen your pain and discomfort. °SEEK IMMEDIATE MEDICAL CARE IF:  °· You lose control of your bowel or bladder (incontinence). °· You have increasing weakness in the lower back, pelvis, buttocks,   or legs.  You have redness or swelling of your back.  You have a burning sensation when you urinate.  You have pain that gets worse when you lie down or awakens you at night.  Your pain is worse than you have experienced in the past.  Your pain is lasting longer than 4 weeks.  You are suddenly losing weight without reason. MAKE SURE YOU:  Understand these  instructions.  Will watch your condition.  Will get help right away if you are not doing well or get worse.   This information is not intended to replace advice given to you by your health care provider. Make sure you discuss any questions you have with your health care provider.   Document Released: 03/26/2001 Document Revised: 12/21/2014 Document Reviewed: 08/11/2011 Elsevier Interactive Patient Education 2016 [employer].  Constipation, Adult Constipation is when a person has fewer than three bowel movements a week, has difficulty having a bowel movement, or has stools that are dry, hard, or larger than normal. As people grow older, constipation is more common. A low-fiber diet, not taking in enough fluids, and taking certain medicines may make constipation worse.  CAUSES   Certain medicines, such as antidepressants, pain medicine, iron supplements, antacids, and water pills.   Certain diseases, such as diabetes, irritable bowel syndrome (IBS), thyroid disease, or depression.   Not drinking enough water.   Not eating enough fiber-rich foods.   Stress or travel.   Lack of physical activity or exercise.   Ignoring the urge to have a bowel movement.   Using laxatives too much.  SIGNS AND SYMPTOMS   Having fewer than three bowel movements a week.   Straining to have a bowel movement.   Having stools that are hard, dry, or larger than normal.   Feeling full or bloated.   Pain in the lower abdomen.   Not feeling relief after having a bowel movement.  DIAGNOSIS  Your health care provider will take a medical history and perform a physical exam. Further testing may be done for severe constipation. Some tests may include:  A barium enema X-ray to examine your rectum, colon, and, sometimes, your small intestine.   A sigmoidoscopy to examine your lower colon.   A colonoscopy to examine your entire colon. TREATMENT  Treatment will depend on the severity of  your constipation and what is causing it. Some dietary treatments include drinking more fluids and eating more fiber-rich foods. Lifestyle treatments may include regular exercise. If these diet and lifestyle recommendations do not help, your health care provider may recommend taking over-the-counter laxative medicines to help you have bowel movements. Prescription medicines may be prescribed if over-the-counter medicines do not work.  HOME CARE INSTRUCTIONS   Eat foods that have a lot of fiber, such as fruits, vegetables, whole grains, and beans.  Limit foods high in fat and processed sugars, such as french fries, hamburgers, cookies, candies, and soda.   A fiber supplement may be added to your diet if you cannot get enough fiber from foods.   Drink enough fluids to keep your urine clear or pale yellow.   Exercise regularly or as directed by your health care provider.   Go to the restroom when you have the urge to go. Do not hold it.   Only take over-the-counter or prescription medicines as directed by your health care provider. Do not take other medicines for constipation without talking to your health care provider first.  SEEK IMMEDIATE MEDICAL CARE IF:  You have bright red blood in your stool.   Your constipation lasts for more than 4 days or gets worse.   You have abdominal or rectal pain.   You have thin, pencil-like stools.   You have unexplained weight loss. MAKE SURE YOU:   Understand these instructions.  Will watch your condition.  Will get help right away if you are not doing well or get worse.   This information is not intended to replace advice given to you by your health care provider. Make sure you discuss any questions you have with your health care provider.   Document Released: 12/29/2003 Document Revised: 04/22/2014 Document Reviewed: 01/11/2013 Elsevier Interactive Patient Education [employer]

## 2015-04-24 NOTE — ED Provider Notes (Signed)
CSN: [id number]     Arrival date & time 04/24/15  1401 History   First MD Initiated Contact with Patient 04/24/15 1916     Chief Complaint  Patient presents with  . Constipation  . Back Pain     (Consider location/radiation/quality/duration/timing/severity/associated sxs/prior Treatment) HPI Patient presents with low back pain starting 2 days ago. Gradual onset. States pain is worse with movement. Radiates to the right thigh. States she has a history of low back pain but has been while since he had an exacerbation. No numbness or weakness. Patient denied any dysuria, hematuria, frequency or retention incontinence. She has had decreased bowel movements though when asked what appeared time she states "denies frequent as when I was a child". Last bowel movement was yesterday. No melena or gross blood. Denies fever or chills. Episode of nausea last night which has resolved. Patient been taking ibuprofen at home.  Past Medical History  Diagnosis Date  . Migraine   . Obesity   . Gastroesophageal reflux disease   . Numbness     bilateral lower extremity  . MS (multiple sclerosis) (HCC)   . Osteoarthritis     diagnosed by orthopedics  . COPD (chronic obstructive pulmonary disease) (HCC)   . Chronic fatigue 08/31/2014  . B12 deficiency 08/31/2014  . Vitamin D deficiency   . Abnormality of gait 03/03/2015   Past Surgical History  Procedure Laterality Date  . Cesarean section    . Foot surgery      Club feet  . Cosmetic surgery      lower right leg  . Tubal ligation Bilateral    Family History  Problem Relation Age of Onset  . Diabetes Mother   . Bipolar disorder Sister   . Bipolar disorder Brother   . Cancer Maternal Grandmother   . COPD Paternal Grandfather   . Heart disease Paternal Grandfather    Social History  Substance Use Topics  . Smoking status: Current Every Day Smoker -- 1.00 packs/day for 30 years    Types: Cigarettes  . Smokeless tobacco: Never Used  . Alcohol Use:  No   OB History    No data available     Review of Systems  Constitutional: Negative for fever and chills.  Gastrointestinal: Positive for nausea and constipation. Negative for vomiting, abdominal pain, diarrhea and abdominal distention.  Genitourinary: Negative for dysuria, frequency, hematuria, flank pain, difficulty urinating and pelvic pain.  Musculoskeletal: Positive for myalgias and back pain. Negative for arthralgias, neck pain and neck stiffness.  Skin: Negative for rash and wound.  Neurological: Negative for dizziness, weakness, light-headedness, numbness and headaches.  All other systems reviewed and are negative.     Allergies  Review of patient's allergies indicates no known allergies.  Home Medications   Prior to Admission medications   Medication Sig Start Date End Date Taking? Authorizing Provider  budesonide-formoterol (SYMBICORT) 160-4.5 MCG/ACT inhaler Inhale 2 puffs into the lungs 2 (two) times daily.   Yes Historical Provider, MD  ergocalciferol (VITAMIN D2) 50000 UNITS capsule Take 50,000 Units by mouth 2 (two) times a week.   Yes Historical Provider, MD  escitalopram (LEXAPRO) 10 MG tablet TAKE 1 TABLET (10 MG TOTAL) BY MOUTH DAILY. 09/27/14  Yes York Spaniel, MD  Fingolimod HCl (GILENYA) 0.5 MG CAPS Take 1 capsule (0.5 mg total) by mouth daily. 02/21/15  Yes York Spaniel, MD  nystatin (MYCOSTATIN) 100000 UNIT/ML suspension Take 5 mLs by mouth 4 (four) times daily as needed. Reported on  04/24/2015 04/04/15  Yes Historical Provider, MD  pantoprazole (PROTONIX) 40 MG tablet Take 40 mg by mouth daily.   Yes Historical Provider, MD  propranolol (INDERAL) 40 MG tablet TAKE 1 TABLET BY MOUTH TWICE A DAY Patient taking differently: TAKE 1 TABLET BY MOUTH takes once daily and may take another dose later if needed for migraines 01/05/15  Yes York Spaniel, MD  PROVENTIL HFA 108 (90 BASE) MCG/ACT inhaler Inhale 2 puffs into the lungs every 6 (six) hours as needed  for wheezing or shortness of breath.  12/28/12  Yes Historical Provider, MD  ranitidine (ZANTAC) 300 MG tablet Take 300 mg by mouth at bedtime as needed for heartburn.  12/04/13  Yes Historical Provider, MD  tiZANidine (ZANAFLEX) 2 MG tablet Take 2 mg by mouth every 6 (six) hours as needed for muscle spasms.   Yes Historical Provider, MD  benzonatate (TESSALON) 100 MG capsule Take 1 capsule (100 mg total) by mouth 3 (three) times daily as needed for cough. 04/24/15   Loren Racer, MD  docusate sodium (COLACE) 100 MG capsule Take 1 capsule (100 mg total) by mouth every 12 (twelve) hours. 04/24/15   Loren Racer, MD  HYDROcodone-acetaminophen (NORCO) 5-325 MG tablet Take 1-2 tablets by mouth every 4 (four) hours as needed for moderate pain or severe pain. 04/24/15   Loren Racer, MD  ibuprofen (ADVIL,MOTRIN) 200 MG tablet Take 3 tablets (600 mg total) by mouth every 6 (six) hours as needed for headache or moderate pain. 04/24/15   Loren Racer, MD  levofloxacin (LEVAQUIN) 500 MG tablet Take 500 mg by mouth 2 (two) times daily. Reported on 04/24/2015 04/04/15   Historical Provider, MD  methocarbamol (ROBAXIN) 500 MG tablet Take 2 tablets (1,000 mg total) by mouth every 6 (six) hours as needed for muscle spasms. 04/24/15   Loren Racer, MD  predniSONE (STERAPRED UNI-PAK 21 TAB) 10 MG (21) TBPK tablet Take 10-60 mg by mouth as directed. Reported on 04/24/2015 04/04/15   Historical Provider, MD  SUMAtriptan (IMITREX) 20 MG/ACT nasal spray Place 1 spray (20 mg total) into the nose every 2 (two) hours as needed for migraine or headache. Patient not taking: Reported on 04/24/2015 08/31/14   York Spaniel, MD   BP 151/80 mmHg  Pulse 74  Temp(Src) 97.4 F (36.3 C) (Oral)  Resp 16  SpO2 99%  LMP 03/09/2015 Physical Exam  Constitutional: She is oriented to person, place, and time. She appears well-developed and well-nourished. No distress.  HENT:  Head: Normocephalic and atraumatic.  Mouth/Throat:  Oropharynx is clear and moist. No oropharyngeal exudate.  Eyes: EOM are normal. Pupils are equal, round, and reactive to light.  Neck: Normal range of motion. Neck supple.  Cardiovascular: Normal rate and regular rhythm.  Exam reveals no gallop and no friction rub.   No murmur heard. Pulmonary/Chest: Effort normal and breath sounds normal. No respiratory distress. She has no wheezes. She has no rales.  Abdominal: Soft. Bowel sounds are normal. She exhibits no distension and no mass. There is no tenderness. There is no rebound and no guarding.  Mild abdominal fullness. No focal tenderness.  Musculoskeletal: Normal range of motion. She exhibits tenderness. She exhibits no edema.  Patient has tenderness to palpation in the midline lower lumbar spine as well as the paraspinal muscles. There is no obvious trauma. No step-offs. Patient with exacerbation of pain radiating to the right thigh with extension of the right knee. Distal pulses are intact and equal. No lower extremity swelling  or tenderness.   Neurological: She is alert and oriented to person, place, and time.  Patient is alert and oriented x3 with clear, goal oriented speech. Patient has 5/5 motor in all extremities. Sensation is intact to light touch. No saddle anesthesia.   Skin: Skin is warm and dry. No rash noted. No erythema.  Psychiatric: She has a normal mood and affect. Her behavior is normal.  Nursing note and vitals reviewed.   ED Course  Procedures (including critical care time) Labs Review Labs Reviewed  URINALYSIS, ROUTINE W REFLEX MICROSCOPIC (NOT AT [facility]) - Abnormal; Notable for the following:    APPearance CLOUDY (*)    Hgb urine dipstick MODERATE (*)    All other components within normal limits  URINE MICROSCOPIC-ADD ON - Abnormal; Notable for the following:    Squamous Epithelial / LPF 6-30 (*)    Bacteria, UA RARE (*)    All other components within normal limits  PREGNANCY, URINE    Imaging Review Dg Abd Acute  W/chest  04/24/2015  CLINICAL DATA:  Low back pain radiating to abdomen with constipation for 2 days. Recent upper respiratory tract infection. History of COPD, multiple sclerosis. EXAM: DG ABDOMEN ACUTE W/ 1V CHEST COMPARISON:  None. FINDINGS: Cardiomediastinal silhouette is normal. Lungs are clear, no pleural effusions. No pneumothorax. Soft tissue planes and included osseous structures are unremarkable. Bowel gas pattern is nondilated and nonobstructive. Mild amount of retained large bowel stool. No intra-abdominal mass effect, pathologic calcifications or free air. Soft tissue planes and included osseous structures are non-suspicious. Whiskering of the iliac crests can be seen with DISH. IMPRESSION: Normal chest. Mild amount of retained large bowel stool, normal bowel gas pattern. Electronically Signed   By: Awilda Metro M.D.   On: 04/24/2015 20:58   I have personally reviewed and evaluated these images and lab results as part of my medical decision-making.   EKG Interpretation None      MDM   Final diagnoses:  Midline low back pain with right-sided sciatica  Constipation, unspecified constipation type    Patient is well-appearing. Normal neurologic exam. Question muscle strain versus radiculopathy.  Patient is feeling much better after medication. We'll discharge home with symptomatic control. No pericolic signs or symptoms necessitating further emergency department workup. Return precautions have been given.  Loren Racer, MD 04/24/15 2127

## 2015-05-31 ENCOUNTER — Other Ambulatory Visit: Payer: Self-pay

## 2015-05-31 MED ORDER — FINGOLIMOD HCL 0.5 MG PO CAPS: 0.5000 mg | ORAL_CAPSULE | Freq: Every day | ORAL | Status: DC

## 2015-08-30 ENCOUNTER — Encounter: Payer: Self-pay | Admitting: Adult Health

## 2015-08-30 ENCOUNTER — Ambulatory Visit (INDEPENDENT_AMBULATORY_CARE_PROVIDER_SITE_OTHER): Payer: Medicaid Other | Admitting: Adult Health

## 2015-08-30 VITALS — BP 125/79 | HR 70 | Ht 63.0 in | Wt 196.8 lb

## 2015-08-30 DIAGNOSIS — R531 Weakness: Secondary | ICD-10-CM

## 2015-08-30 DIAGNOSIS — G35 Multiple sclerosis: Secondary | ICD-10-CM

## 2015-08-30 DIAGNOSIS — R269 Unspecified abnormalities of gait and mobility: Secondary | ICD-10-CM | POA: Diagnosis not present

## 2015-08-30 DIAGNOSIS — Z5181 Encounter for therapeutic drug level monitoring: Secondary | ICD-10-CM | POA: Diagnosis not present

## 2015-08-30 NOTE — Progress Notes (Signed)
PATIENT: Sue Sanchez DOB: [birth year]  REASON FOR VISIT: follow up- multiple sclerosis HISTORY FROM: patient  HISTORY OF PRESENT ILLNESS: Sue Sanchez is a 45 year old female with a history of multiple sclerosis. She returns today for follow-up. The patient is on Gilenya. She is tolerating it well. She states that she continues to have generalized weakness. She states that this has continued to get worse. She also complains of generalized joint pain. She reports stumbling with occasional falls. She will use a cane intermittently. She has never participated in physical therapy. She states that she does have a history of vertigo and recently when moving the eyes she will get dizzy. In the past she has used meclizine for this. Denies any new numbness. Denies any significant changes with her vision. She had an MRI last May that did not show any acute lesions. The patient does feel that her symptoms have progressed over the last several months. She returns today for an evaluation.  HISTORY 05/02/14 (WILLIS): Sue Sanchez is a 45 year old right-handed white female with a history of multiple sclerosis. The patient is on Gilenya, she is tolerating the medication well. She reports no definite new MS symptoms since last seen. She does have right knee discomfort, she occasionally will have problems with her knee collapsing as she is going upstairs. The patient does have some gait instability, she will fall on occasion. She has had some occasional urinary incontinence in the past, this has improved recently. She has chronic fatigue that is quite significant. She denies any new numbness, weakness, or visual changes. She returns for an evaluation. She has been seen by Dr. Dione Booze recently for an ophthalmologic evaluation that was unremarkable. MRI evaluation done recently of the brain and cervical cord showed good stability.  REVIEW OF SYSTEMS: Out of a complete 14 system review of symptoms, the patient  complains only of the following symptoms, and all other reviewed systems are negative.  Ear pain, ringing in ears, trouble swallowing, cough, wheezing, shortness of breath, choking, chest tightness, constipation, diarrhea, frequent waking, daytime sleepiness, cold intolerance, heat intolerance, joint pain, back pain, aching muscles, walking difficulty, depression, nervous/anxious, confusion, agitation, weakness, speech difficulty, numbness, dizziness, memory loss  ALLERGIES: No Known Allergies  HOME MEDICATIONS: Outpatient Prescriptions Prior to Visit  Medication Sig Dispense Refill  . budesonide-formoterol (SYMBICORT) 160-4.5 MCG/ACT inhaler Inhale 2 puffs into the lungs 2 (two) times daily.    . ergocalciferol (VITAMIN D2) 50000 UNITS capsule Take 50,000 Units by mouth 2 (two) times a week.    . escitalopram (LEXAPRO) 10 MG tablet TAKE 1 TABLET (10 MG TOTAL) BY MOUTH DAILY. 90 tablet 4  . Fingolimod HCl (GILENYA) 0.5 MG CAPS Take 1 capsule (0.5 mg total) by mouth daily. 30 capsule 3  . ibuprofen (ADVIL,MOTRIN) 200 MG tablet Take 3 tablets (600 mg total) by mouth every 6 (six) hours as needed for headache or moderate pain. 30 tablet 0  . nystatin (MYCOSTATIN) 100000 UNIT/ML suspension Take 5 mLs by mouth 4 (four) times daily as needed. Reported on 04/24/2015  0  . pantoprazole (PROTONIX) 40 MG tablet Take 40 mg by mouth daily.    . propranolol (INDERAL) 40 MG tablet TAKE 1 TABLET BY MOUTH TWICE A DAY (Patient taking differently: TAKE 1 TABLET BY MOUTH takes once daily and may take another dose later if needed for migraines) 180 tablet 1  . PROVENTIL HFA 108 (90 BASE) MCG/ACT inhaler Inhale 2 puffs into the lungs every 6 (six) hours as needed for  wheezing or shortness of breath.     . ranitidine (ZANTAC) 300 MG tablet Take 300 mg by mouth at bedtime as needed for heartburn.   11  . benzonatate (TESSALON) 100 MG capsule Take 1 capsule (100 mg total) by mouth 3 (three) times daily as needed for  cough. (Patient not taking: Reported on 08/30/2015) 21 capsule 0  . docusate sodium (COLACE) 100 MG capsule Take 1 capsule (100 mg total) by mouth every 12 (twelve) hours. (Patient not taking: Reported on 08/30/2015) 60 capsule 0  . HYDROcodone-acetaminophen (NORCO) 5-325 MG tablet Take 1-2 tablets by mouth every 4 (four) hours as needed for moderate pain or severe pain. (Patient not taking: Reported on 08/30/2015) 15 tablet 0  . levofloxacin (LEVAQUIN) 500 MG tablet Take 500 mg by mouth 2 (two) times daily. Reported on 08/30/2015  0  . methocarbamol (ROBAXIN) 500 MG tablet Take 2 tablets (1,000 mg total) by mouth every 6 (six) hours as needed for muscle spasms. (Patient not taking: Reported on 08/30/2015) 30 tablet 0  . predniSONE (STERAPRED UNI-PAK 21 TAB) 10 MG (21) TBPK tablet Take 10-60 mg by mouth as directed. Reported on 08/30/2015  0  . SUMAtriptan (IMITREX) 20 MG/ACT nasal spray Place 1 spray (20 mg total) into the nose every 2 (two) hours as needed for migraine or headache. (Patient not taking: Reported on 08/30/2015) 1 Inhaler 3  . tiZANidine (ZANAFLEX) 2 MG tablet Take 2 mg by mouth every 6 (six) hours as needed for muscle spasms. Reported on 08/30/2015     No facility-administered medications prior to visit.    PAST MEDICAL HISTORY: Past Medical History  Diagnosis Date  . Migraine   . Obesity   . Gastroesophageal reflux disease   . Numbness     bilateral lower extremity  . MS (multiple sclerosis) (HCC)   . Osteoarthritis     diagnosed by orthopedics  . COPD (chronic obstructive pulmonary disease) (HCC)   . Chronic fatigue 08/31/2014  . B12 deficiency 08/31/2014  . Vitamin D deficiency   . Abnormality of gait 03/03/2015    PAST SURGICAL HISTORY: Past Surgical History  Procedure Laterality Date  . Cesarean section    . Foot surgery      Club feet  . Cosmetic surgery      lower right leg  . Tubal ligation Bilateral     FAMILY HISTORY: Family History  Problem Relation Age of  Onset  . Diabetes Mother   . Bipolar disorder Sister   . Bipolar disorder Brother   . Cancer Maternal Grandmother   . COPD Paternal Grandfather   . Heart disease Paternal Grandfather     SOCIAL HISTORY: Social History   Social History  . Marital Status: Married    Spouse Name: N/A  . Number of Children: 2  . Years of Education: college   Occupational History  . Unemployed    Social History Main Topics  . Smoking status: Current Every Day Smoker -- 0.50 packs/day for 30 years    Types: Cigarettes  . Smokeless tobacco: Never Used  . Alcohol Use: No  . Drug Use: No  . Sexual Activity: Yes   Other Topics Concern  . Not on file   Social History Narrative   Patient is Divorced with 2 children.   Patient has a college education.   Patient is right handed.   Caffeine Intake: 6 or more sodas daily      Patient has a dog named Butters.  PHYSICAL EXAM  Filed Vitals:   08/30/15 0853  BP: 125/79  Pulse: 70  Height:  (1.6 m)  Weight: 196 lb 12.8 oz (89.268 kg)   Body mass index is 34.87 kg/(m^2).  Generalized: Well developed, in no acute distress   Neurological examination  Mentation: Alert oriented to time, place, history taking. Follows all commands speech and language fluent Cranial nerve II-XII: Pupils were equal round reactive to light. Extraocular movements were full, visual field were full on confrontational test. Facial sensation and strength were normal. Uvula tongue midline. Head turning and shoulder shrug  were normal and symmetric. Motor: The motor testing reveals 5 over 5 strength of all 4 extremities. Good symmetric motor tone is noted throughout.  Sensory: Sensory testing is intact to soft touch on all 4 extremities. No evidence of extinction is noted.  Coordination: Cerebellar testing reveals good finger-nose-finger and heel-to-shin bilaterally.  Gait and station: Gait is normal. Tandem gait is unsteady. Romberg is negative. No drift is seen.    Reflexes: Deep tendon reflexes are symmetric and normal bilaterally.   DIAGNOSTIC DATA (LABS, IMAGING, TESTING) - I reviewed patient records, labs, notes, testing and imaging myself where available.  Lab Results  Component Value Date   WBC 6.5 03/03/2015   HGB 15.1 02/28/2014   HCT 45.1 03/03/2015   MCV 90 03/03/2015   PLT 325 03/03/2015      Component Value Date/Time   NA 141 03/03/2015 0942   K 4.5 03/03/2015 0942   CL 102 03/03/2015 0942   CO2 24 03/03/2015 0942   GLUCOSE 84 03/03/2015 0942   BUN 7 03/03/2015 0942   CREATININE 0.96 03/03/2015 0942   CALCIUM 9.6 03/03/2015 0942   PROT 6.6 03/03/2015 0942   ALBUMIN 4.1 03/03/2015 0942   AST 22 03/03/2015 0942   ALT 29 03/03/2015 0942   ALKPHOS 127* 03/03/2015 0942   BILITOT 0.4 03/03/2015 0942   BILITOT 0.4 02/28/2014 1256   GFRNONAA 71 03/03/2015 0942   GFRAA 82 03/03/2015 0942     ASSESSMENT AND PLAN 45 y.o. year old female  has a past medical history of Migraine; Obesity; Gastroesophageal reflux disease; Numbness; MS (multiple sclerosis) (HCC); Osteoarthritis; COPD (chronic obstructive pulmonary disease) (HCC); Chronic fatigue (08/31/2014); B12 deficiency (08/31/2014); Vitamin D deficiency; and Abnormality of gait (03/03/2015). here with:  1. Multiple sclerosis 2. Generalized weakness 3. Gait disturbance  The patient does feel that her symptoms have continued to progress. Her physical exam is relatively unremarkable. No definitive weakness noted on exam. She will continue on Gilenya. I will check an MRI of the brain. We will check blood work today. Patient advised that if her symptoms worsen or she develops any new symptoms she should let us know. She will follow-up in 3 months or sooner if needed.    Butch Penny, MSN, NP-C 08/30/2015, 9:13 AM [facility] [address] [place], [place] [address] [phone]

## 2015-08-30 NOTE — Patient Instructions (Signed)
Continue Gilenya Blood work today MRI brain If your symptoms worsen or you develop new symptoms please let us know.

## 2015-08-30 NOTE — Progress Notes (Signed)
I have read the note, and I agree with the clinical assessment and plan.  WILLIS,CHARLES KEITH   

## 2015-08-31 ENCOUNTER — Ambulatory Visit: Payer: Medicaid Other | Admitting: Adult Health

## 2015-08-31 LAB — CBC WITH DIFFERENTIAL/PLATELET
BASOS ABS: 0 10*3/uL (ref 0.0–0.2)
Basos: 0 %
EOS (ABSOLUTE): 0.3 10*3/uL (ref 0.0–0.4)
Eos: 3 %
HEMOGLOBIN: 15.4 g/dL (ref 11.1–15.9)
Hematocrit: 46 % (ref 34.0–46.6)
Immature Grans (Abs): 0.1 10*3/uL (ref 0.0–0.1)
Immature Granulocytes: 1 %
LYMPHS ABS: 0.7 10*3/uL (ref 0.7–3.1)
Lymphs: 10 %
MCH: 31 pg (ref 26.6–33.0)
MCHC: 33.5 g/dL (ref 31.5–35.7)
MCV: 93 fL (ref 79–97)
MONOCYTES: 12 %
Monocytes Absolute: 0.9 10*3/uL (ref 0.1–0.9)
NEUTROS ABS: 5.7 10*3/uL (ref 1.4–7.0)
Neutrophils: 74 %
Platelets: 338 10*3/uL (ref 150–379)
RBC: 4.97 x10E6/uL (ref 3.77–5.28)
RDW: 13.2 % (ref 12.3–15.4)
WBC: 7.7 10*3/uL (ref 3.4–10.8)

## 2015-08-31 LAB — COMPREHENSIVE METABOLIC PANEL
A/G RATIO: 1.8 (ref 1.2–2.2)
ALBUMIN: 4.2 g/dL (ref 3.5–5.5)
ALT: 20 IU/L (ref 0–32)
AST: 18 IU/L (ref 0–40)
Alkaline Phosphatase: 125 IU/L — ABNORMAL HIGH (ref 39–117)
BILIRUBIN TOTAL: 0.4 mg/dL (ref 0.0–1.2)
BUN / CREAT RATIO: 11 (ref 9–23)
BUN: 10 mg/dL (ref 6–24)
CHLORIDE: 98 mmol/L (ref 96–106)
CO2: 26 mmol/L (ref 18–29)
Calcium: 10.1 mg/dL (ref 8.7–10.2)
Creatinine, Ser: 0.87 mg/dL (ref 0.57–1.00)
GFR calc non Af Amer: 80 mL/min/{1.73_m2} (ref >59)
GFR, EST AFRICAN AMERICAN: 92 mL/min/{1.73_m2} (ref >59)
GLOBULIN, TOTAL: 2.4 g/dL (ref 1.5–4.5)
Glucose: 91 mg/dL (ref 65–99)
POTASSIUM: 5.5 mmol/L — AB (ref 3.5–5.2)
Sodium: 138 mmol/L (ref 134–144)
TOTAL PROTEIN: 6.6 g/dL (ref 6.0–8.5)

## 2015-09-01 ENCOUNTER — Telehealth: Payer: Self-pay | Admitting: Adult Health

## 2015-09-01 NOTE — Telephone Encounter (Signed)
I called the patient. Lab work is ok. Potassium slightly elevated. I will forward to PCP.

## 2015-09-04 ENCOUNTER — Telehealth: Payer: Self-pay | Admitting: *Deleted

## 2015-09-04 NOTE — Telephone Encounter (Signed)
Done/ See result note. 

## 2015-09-04 NOTE — Telephone Encounter (Signed)
-----   Message from Butch Penny, NP sent at 09/04/2015 10:22 AM EDT ----- Patient is aware of results- please forward to PCP

## 2015-09-05 ENCOUNTER — Other Ambulatory Visit: Payer: Self-pay | Admitting: Adult Health

## 2015-09-07 ENCOUNTER — Ambulatory Visit
Admission: RE | Admit: 2015-09-07 | Discharge: 2015-09-07 | Disposition: A | Discharge status: Home / Self Care | Payer: Medicaid Other | Source: Ambulatory Visit | Attending: Adult Health | Admitting: Adult Health

## 2015-09-07 DIAGNOSIS — G35 Multiple sclerosis: Secondary | ICD-10-CM | POA: Diagnosis not present

## 2015-09-07 MED ORDER — GADOBENATE DIMEGLUMINE 529 MG/ML IV SOLN
18.0000 mL | Freq: Once | INTRAVENOUS | Status: AC | PRN
  Administered 2015-09-07: 18 mL via INTRAVENOUS

## 2015-09-12 ENCOUNTER — Telehealth: Payer: Self-pay | Admitting: *Deleted

## 2015-09-12 NOTE — Telephone Encounter (Signed)
Spoke to pt and relayed no significant changes from previous scan.  She verbalized understanding.

## 2015-09-12 NOTE — Telephone Encounter (Signed)
-----   Message from Butch Penny, NP sent at 09/12/2015  6:11 AM EDT ----- No signigicant change from previous scan. Please call patient.

## 2015-09-16 ENCOUNTER — Other Ambulatory Visit: Payer: Self-pay | Admitting: Neurology

## 2015-09-27 ENCOUNTER — Other Ambulatory Visit: Payer: Self-pay | Admitting: *Deleted

## 2015-09-27 MED ORDER — FINGOLIMOD HCL 0.5 MG PO CAPS: 0.5000 mg | ORAL_CAPSULE | Freq: Every day | ORAL | Status: DC

## 2016-01-05 ENCOUNTER — Other Ambulatory Visit: Payer: Self-pay | Admitting: Neurology

## 2016-01-09 ENCOUNTER — Telehealth: Payer: Self-pay | Admitting: Neurology

## 2016-01-09 NOTE — Telephone Encounter (Signed)
Patient reports new sharp pain in the lower back that radiates down bil legs, hard to walk that started about 2 mths ago. She saw PCP today and was suggested she see Dr Lacretia Nicks as PCP did not know if it is connected to pt's MS.  An appointment was made for 9/29 @ 8am.  Advised that the nurse would call if there was any other questions.

## 2016-01-12 ENCOUNTER — Encounter: Payer: Self-pay | Admitting: Neurology

## 2016-01-12 ENCOUNTER — Ambulatory Visit (INDEPENDENT_AMBULATORY_CARE_PROVIDER_SITE_OTHER): Payer: Medicaid Other | Admitting: Neurology

## 2016-01-12 VITALS — BP 140/92 | HR 80 | Ht 63.0 in | Wt 189.5 lb

## 2016-01-12 DIAGNOSIS — M545 Low back pain: Secondary | ICD-10-CM | POA: Diagnosis not present

## 2016-01-12 DIAGNOSIS — G43109 Migraine with aura, not intractable, without status migrainosus: Secondary | ICD-10-CM

## 2016-01-12 DIAGNOSIS — R5382 Chronic fatigue, unspecified: Secondary | ICD-10-CM

## 2016-01-12 DIAGNOSIS — G8929 Other chronic pain: Secondary | ICD-10-CM | POA: Diagnosis not present

## 2016-01-12 DIAGNOSIS — G35 Multiple sclerosis: Secondary | ICD-10-CM

## 2016-01-12 DIAGNOSIS — Z5181 Encounter for therapeutic drug level monitoring: Secondary | ICD-10-CM | POA: Diagnosis not present

## 2016-01-12 DIAGNOSIS — R269 Unspecified abnormalities of gait and mobility: Secondary | ICD-10-CM | POA: Diagnosis not present

## 2016-01-12 HISTORY — DX: Migraine with aura, not intractable, without status migrainosus: G43.109

## 2016-01-12 MED ORDER — PRAMIPEXOLE DIHYDROCHLORIDE 0.25 MG PO TABS: 0.2500 mg | ORAL_TABLET | Freq: Every day | ORAL | 1 refills | Status: DC

## 2016-01-12 NOTE — Progress Notes (Signed)
Reason for visit: Multiple sclerosis  Sue Sanchez is an 45 y.o. female  History of present illness:  Sue Sanchez is a 45 year old right-handed white female with a history of multiple sclerosis and a history of migraine headaches. The patient has been on Gilenya, she tolerates the medication well. Since last seen she has developed chronic low back pain with pain that alternates going down the right leg or the left leg. The patient indicates that at nighttime she will have burning sensations in the legs, she will have to get up and walk and stretch the legs. She is having difficulty sleeping because of this. The patient does report some stress incontinence of the bladder that predates the low back pain. The patient has chronic fatigue, she is on propranolol for the migraine which seems to help. The patient averages one or 2 headaches a week, stress will increase the headache. The patient may have intermittent brief stinging or vibration sensations throughout the body lasting only a second or 2. The patient denies any vision changes. Her migraine headaches are associated with visual aura, the headaches are around the left eye. The patient may take ibuprofen or Relpax for the headache. The patient returns for further evaluation.  Past Medical History:  Diagnosis Date  . Abnormality of gait 03/03/2015  . B12 deficiency 08/31/2014  . Chronic fatigue 08/31/2014  . COPD (chronic obstructive pulmonary disease) (HCC)   . Gastroesophageal reflux disease   . Migraine   . MS (multiple sclerosis) (HCC)   . Numbness    bilateral lower extremity  . Obesity   . Osteoarthritis    diagnosed by orthopedics  . Vitamin D deficiency     Past Surgical History:  Procedure Laterality Date  . CESAREAN SECTION    . COSMETIC SURGERY     lower right leg  . FOOT SURGERY     Club feet  . TUBAL LIGATION Bilateral     Family History  Problem Relation Age of Onset  . Diabetes Mother   . Bipolar  disorder Sister   . Bipolar disorder Brother   . Cancer Maternal Grandmother   . COPD Paternal Grandfather   . Heart disease Paternal Grandfather     Social history:  reports that she has been smoking Cigarettes.  She has a 15.00 pack-year smoking history. She has never used smokeless tobacco. She reports that she does not drink alcohol or use drugs.   No Known Allergies  Medications:  Prior to Admission medications   Medication Sig Start Date End Date Taking? Authorizing Provider  budesonide-formoterol (SYMBICORT) 160-4.5 MCG/ACT inhaler Inhale 2 puffs into the lungs 2 (two) times daily.   Yes Historical Provider, MD  eletriptan (RELPAX) 20 MG tablet Take 20 mg by mouth as needed for migraine or headache. May repeat in 2 hours if headache persists or recurs.   Yes Historical Provider, MD  ergocalciferol (VITAMIN D2) 50000 UNITS capsule Take 50,000 Units by mouth 2 (two) times a week.   Yes Historical Provider, MD  escitalopram (LEXAPRO) 10 MG tablet TAKE 1 TABLET (10 MG TOTAL) BY MOUTH DAILY. 01/08/16  Yes York Spanielharles K Emelin Dascenzo, MD  Fingolimod HCl (GILENYA) 0.5 MG CAPS Take 1 capsule (0.5 mg total) by mouth daily. 09/27/15  Yes York Spanielharles K Philicia Heyne, MD  ibuprofen (ADVIL,MOTRIN) 200 MG tablet Take 3 tablets (600 mg total) by mouth every 6 (six) hours as needed for headache or moderate pain. 04/24/15  Yes Loren Raceravid Yelverton, MD  nystatin (MYCOSTATIN) 100000 UNIT/ML suspension  Take 5 mLs by mouth 4 (four) times daily as needed. Reported on 04/24/2015 04/04/15  Yes Historical Provider, MD  pantoprazole (PROTONIX) 40 MG tablet Take 40 mg by mouth 2 (two) times daily.    Yes Historical Provider, MD  propranolol (INDERAL) 40 MG tablet TAKE 1 TABLET BY MOUTH TWICE A DAY 09/19/15  Yes York Spaniel, MD  PROVENTIL HFA 108 (90 BASE) MCG/ACT inhaler Inhale 2 puffs into the lungs every 6 (six) hours as needed for wheezing or shortness of breath.  12/28/12  Yes Historical Provider, MD  ranitidine (ZANTAC) 300 MG tablet  Take 300 mg by mouth at bedtime as needed for heartburn.  12/04/13  Yes Historical Provider, MD    ROS:  Out of a complete 14 system review of symptoms, the patient complains only of the following symptoms, and all other reviewed systems are negative.  Eye pain, blurred vision Cough, shortness of breath, choking Incontinence of the bladder Restless legs, insomnia, daytime sleepiness Joint pain, back pain, achy muscles, muscle cramps, walking difficulty, neck pain, neck stiffness Itching Memory loss, dizziness, headache, numbness, speech difficulty, weakness Agitation, depression, anxiety  Blood pressure (!) 140/92, pulse 80, height 5\' 3"  (1.6 m), weight 189 lb 8 oz (86 kg).  Physical Exam  General: The patient is alert and cooperative at the time of the examination. The patient is moderately obese.  Neuromuscular: The patient lacks about 15 team degrees of full flexion of the low back, she has some tenderness in the mid lumbar area and over the left SI joint.  Skin: No significant peripheral edema is noted. Some deformities are seen in both feet.   Neurologic Exam  Mental status: The patient is alert and oriented x 3 at the time of the examination. The patient has apparent normal recent and remote memory, with an apparently normal attention span and concentration ability.   Cranial nerves: Facial symmetry is present. Speech is normal, no aphasia or dysarthria is noted. Extraocular movements are full. Visual fields are full. Pupils are equal, round, and reactive to light. Discs are flat bilaterally.  Motor: The patient has good strength in all 4 extremities.  Sensory examination: Soft touch sensation is symmetric on the face, arms, and legs.  Coordination: The patient has good finger-nose-finger and heel-to-shin bilaterally.  Gait and station: The patient has a normal gait. Tandem gait is slightly unsteady. Romberg is negative. No drift is seen.  Reflexes: Deep tendon reflexes  are symmetric.   MRI brain 09/08/15:  IMPRESSION:  Abnormal MRI scan of the brain showing periventricular and juxtacortical white matter hyperintensities which are compatible with multiple sclerosis. No enhancing lesions are noted. Overall no significant change compared with previous MRI scan dated 09/08/2014   Assessment/Plan:  1. Multiple sclerosis  2. Chronic low back pain  3. Classic migraine headache  4. Chronic fatigue  5. Possible restless leg syndrome  6. Mild gait disorder  A prescription was given for a cane. The patient was given a prescription for Mirapex to see if this helps the symptoms of the legs at night and may allow her to sleep better. If the fatigue does not improve, we may need to alter the medication for the migraine, and switch the patient off of propranolol. The patient will have blood work done today, she will be set up for MRI of the lumbar spine. She will follow-up in 6 months, sooner if needed. MRI of the brain was done recently in May 2017, this showed good stability from  the previous year.  Marlan Palau MD 01/12/2016 8:03 AM  [facility] [address] [place], [place] [address]  Phone [phone] Fax [phone]

## 2016-01-12 NOTE — Patient Instructions (Signed)
   We will check MRI of the low back, add Mirapex for possible restless legs.  Mirapex (pramipexole) may result in confusion or hallucinations, dizziness, drowsiness, or nausea. If any significant side effects are noted, please contact our office.

## 2016-01-13 LAB — CBC WITH DIFFERENTIAL/PLATELET
Basophils Absolute: 0 10*3/uL (ref 0.0–0.2)
Basos: 0 %
EOS (ABSOLUTE): 0.2 10*3/uL (ref 0.0–0.4)
EOS: 2 %
HEMATOCRIT: 45.2 % (ref 34.0–46.6)
HEMOGLOBIN: 15.4 g/dL (ref 11.1–15.9)
Immature Grans (Abs): 0 10*3/uL (ref 0.0–0.1)
Immature Granulocytes: 1 %
LYMPHS ABS: 0.7 10*3/uL (ref 0.7–3.1)
Lymphs: 9 %
MCH: 31.2 pg (ref 26.6–33.0)
MCHC: 34.1 g/dL (ref 31.5–35.7)
MCV: 92 fL (ref 79–97)
MONOCYTES: 10 %
Monocytes Absolute: 0.8 10*3/uL (ref 0.1–0.9)
NEUTROS ABS: 6.1 10*3/uL (ref 1.4–7.0)
Neutrophils: 78 %
Platelets: 292 10*3/uL (ref 150–379)
RBC: 4.93 x10E6/uL (ref 3.77–5.28)
RDW: 13.4 % (ref 12.3–15.4)
WBC: 7.8 10*3/uL (ref 3.4–10.8)

## 2016-01-13 LAB — COMPREHENSIVE METABOLIC PANEL
ALBUMIN: 4.1 g/dL (ref 3.5–5.5)
ALT: 35 IU/L — ABNORMAL HIGH (ref 0–32)
AST: 21 IU/L (ref 0–40)
Albumin/Globulin Ratio: 1.5 (ref 1.2–2.2)
Alkaline Phosphatase: 132 IU/L — ABNORMAL HIGH (ref 39–117)
BUN / CREAT RATIO: 10 (ref 9–23)
BUN: 9 mg/dL (ref 6–24)
Bilirubin Total: 0.5 mg/dL (ref 0.0–1.2)
CALCIUM: 9.5 mg/dL (ref 8.7–10.2)
CO2: 26 mmol/L (ref 18–29)
CREATININE: 0.91 mg/dL (ref 0.57–1.00)
Chloride: 99 mmol/L (ref 96–106)
GFR calc non Af Amer: 75 mL/min/{1.73_m2} (ref >59)
GFR, EST AFRICAN AMERICAN: 87 mL/min/{1.73_m2} (ref >59)
GLUCOSE: 89 mg/dL (ref 65–99)
Globulin, Total: 2.7 g/dL (ref 1.5–4.5)
Potassium: 4.8 mmol/L (ref 3.5–5.2)
Sodium: 139 mmol/L (ref 134–144)
TOTAL PROTEIN: 6.8 g/dL (ref 6.0–8.5)

## 2016-01-15 ENCOUNTER — Telehealth: Payer: Self-pay

## 2016-01-15 NOTE — Telephone Encounter (Signed)
Called pt w/ unremarkable lab results. May call back w/ additional questions/concerns. 

## 2016-01-15 NOTE — Telephone Encounter (Signed)
-----   Message from York Spaniel, MD sent at 01/14/2016 12:32 PM EDT ----- Mild stable increase in alkaline phos. Enzyme, and minimal increase in ALT, will follow, CBC in normal, No dose adjustment of the Gilenya. Please call the patient. ----- Message ----- From: Nell Range Lab Results In Sent: 01/13/2016   5:40 AM To: York Spaniel, MD

## 2016-01-22 ENCOUNTER — Telehealth: Payer: Self-pay | Admitting: Neurology

## 2016-01-22 NOTE — Telephone Encounter (Signed)
The patient was seen by Dr. Dione Booze, no evidence of retinal abnormalities on Gilenya. Date of examination was 12/29/2015.

## 2016-01-23 ENCOUNTER — Inpatient Hospital Stay: Admission: RE | Admit: 2016-01-23 | Payer: Medicaid Other | Source: Ambulatory Visit

## 2016-01-29 ENCOUNTER — Telehealth: Payer: Self-pay | Admitting: Neurology

## 2016-01-29 NOTE — Telephone Encounter (Signed)
Patient called to inquire if appeal is being done for MRI Lumbar Spine that [employer] denied. Please call to advise.

## 2016-01-30 MED ORDER — GABAPENTIN 300 MG PO CAPS: 300.0000 mg | ORAL_CAPSULE | Freq: Two times a day (BID) | ORAL | 3 refills | Status: DC

## 2016-01-30 NOTE — Telephone Encounter (Signed)
Medicaid will require 6 weeks of therapy before the MRI will be approved. I will call the patient.  The patient was given steroids by the primary MD, this did not help. We will try gabapentin for the back pain, the patient will call in 4 or 5 weeks if this is not helpful, we will reorder the MRI at that time.

## 2016-01-30 NOTE — Addendum Note (Signed)
Addended by: Stephanie Acre on: 01/30/2016 06:51 PM   Modules accepted: Orders

## 2016-01-30 NOTE — Telephone Encounter (Signed)
Medicaid denied this patients MRI, how would you like to proceed?

## 2016-02-06 NOTE — Telephone Encounter (Signed)
Noted  

## 2016-02-29 ENCOUNTER — Ambulatory Visit: Payer: Medicaid Other | Admitting: Adult Health

## 2016-04-01 ENCOUNTER — Telehealth: Payer: Self-pay | Admitting: Neurology

## 2016-04-01 MED ORDER — PREDNISONE 10 MG PO TABS: ORAL_TABLET | ORAL | 0 refills | Status: DC

## 2016-04-01 NOTE — Addendum Note (Signed)
Addended by: Stephanie Acre on: 04/01/2016 06:39 PM   Modules accepted: Orders

## 2016-04-01 NOTE — Telephone Encounter (Signed)
Pt was last seen in Sept for MS, migraines and back pain. She was switched from propanolol to Mirapex for her RLS. She is also on Gilenya and Lexapro. In October, she was started on gabapentin for back pain. Medicaid denied lumbar MRI at that time.

## 2016-04-01 NOTE — Telephone Encounter (Signed)
I called patient. The patient had severe back pain starting yesterday with pain going down both legs along with numbness and gait instability.  The patient reports that she has had a cough for about a month, she will be seeing her primary care doctor, no fevers or chills.  The patient denies any change in bladder function.  The patient will be placed on a prednisone Dosepak, we'll get a revisit for her.  Prior MRI of the lumbar spine was ordered, not done, we will try to get this reordered.

## 2016-04-01 NOTE — Telephone Encounter (Signed)
Patient is calling stating gabapentin (NEURONTIN) 300 MG capsule worked for [employer] for back pain but is not helping now. Yesterday she could barely move because of the pain. Please call and discuss.

## 2016-04-03 NOTE — Telephone Encounter (Signed)
Called patient. She has not yet picked up prednisone. Reminded her that it has been called into pharmacy. F/u appt scheduled 04/20/15 @ noon. Pt verbalized understanding and appreciation for call.

## 2016-04-19 ENCOUNTER — Ambulatory Visit (INDEPENDENT_AMBULATORY_CARE_PROVIDER_SITE_OTHER): Payer: Medicare Other | Admitting: Neurology

## 2016-04-19 ENCOUNTER — Encounter: Payer: Self-pay | Admitting: Neurology

## 2016-04-19 VITALS — BP 133/88 | HR 66 | Ht 63.0 in | Wt 192.0 lb

## 2016-04-19 DIAGNOSIS — R5382 Chronic fatigue, unspecified: Secondary | ICD-10-CM | POA: Diagnosis not present

## 2016-04-19 DIAGNOSIS — M5442 Lumbago with sciatica, left side: Secondary | ICD-10-CM

## 2016-04-19 DIAGNOSIS — G35 Multiple sclerosis: Secondary | ICD-10-CM

## 2016-04-19 DIAGNOSIS — R269 Unspecified abnormalities of gait and mobility: Secondary | ICD-10-CM

## 2016-04-19 DIAGNOSIS — G8929 Other chronic pain: Secondary | ICD-10-CM

## 2016-04-19 DIAGNOSIS — Z5181 Encounter for therapeutic drug level monitoring: Secondary | ICD-10-CM

## 2016-04-19 NOTE — Progress Notes (Signed)
Reason for visit: Multiple sclerosis  Sue Sanchez is an 46 y.o. female  History of present illness:  Sue Sanchez is a 46 year old right-handed white female with a history of multiple sclerosis on Gilenya. The patient has had a recent ophthalmologic evaluation that was unremarkable. The patient has had some chronic issues with low back pain with left greater than right lower extremity discomfort. The pain down the left leg will go to the foot, the pain down the right leg will go to the knee. The patient has had courses of prednisone but these treatments have resulted in oral thrush. The patient feels that the legs are slightly weak, she denies any falls. She does have some urinary urgency and incontinence at times. She denies any new symptoms of numbness, weakness, visual changes, or balance. She is using a quad cane for ambulation. She returns to this office for an evaluation.  Past Medical History:  Diagnosis Date  . Abnormality of gait 03/03/2015  . B12 deficiency 08/31/2014  . Chronic fatigue 08/31/2014  . Classic migraine 01/12/2016  . COPD (chronic obstructive pulmonary disease) (HCC)   . Gastroesophageal reflux disease   . Migraine   . MS (multiple sclerosis) (HCC)   . Numbness    bilateral lower extremity  . Obesity   . Osteoarthritis    diagnosed by orthopedics  . Vitamin D deficiency     Past Surgical History:  Procedure Laterality Date  . CESAREAN SECTION    . COSMETIC SURGERY     lower right leg  . FOOT SURGERY     Club feet  . TUBAL LIGATION Bilateral     Family History  Problem Relation Age of Onset  . Diabetes Mother   . Bipolar disorder Sister   . Bipolar disorder Brother   . Cancer Maternal Grandmother   . COPD Paternal Grandfather   . Heart disease Paternal Grandfather     Social history:  reports that she has been smoking Cigarettes.  She has a 15.00 pack-year smoking history. She has never used smokeless tobacco. She reports that she does  not drink alcohol or use drugs.   No Known Allergies  Medications:  Prior to Admission medications   Medication Sig Start Date End Date Taking? Authorizing Provider  budesonide-formoterol (SYMBICORT) 160-4.5 MCG/ACT inhaler Inhale 2 puffs into the lungs 2 (two) times daily.   Yes Historical Provider, MD  eletriptan (RELPAX) 20 MG tablet Take 20 mg by mouth as needed for migraine or headache. May repeat in 2 hours if headache persists or recurs.   Yes Historical Provider, MD  ergocalciferol (VITAMIN D2) 50000 UNITS capsule Take 50,000 Units by mouth 2 (two) times a week.   Yes Historical Provider, MD  escitalopram (LEXAPRO) 10 MG tablet TAKE 1 TABLET (10 MG TOTAL) BY MOUTH DAILY. 01/08/16  Yes York Spaniel, MD  Fingolimod HCl (GILENYA) 0.5 MG CAPS Take 1 capsule (0.5 mg total) by mouth daily. 09/27/15  Yes York Spaniel, MD  ibuprofen (ADVIL,MOTRIN) 200 MG tablet Take 3 tablets (600 mg total) by mouth every 6 (six) hours as needed for headache or moderate pain. 04/24/15  Yes Loren Racer, MD  nystatin (MYCOSTATIN) 100000 UNIT/ML suspension Take 5 mLs by mouth 4 (four) times daily as needed. Reported on 04/24/2015 04/04/15  Yes Historical Provider, MD  pantoprazole (PROTONIX) 40 MG tablet Take 40 mg by mouth 2 (two) times daily.    Yes Historical Provider, MD  predniSONE (DELTASONE) 10 MG tablet Begin taking 6  tablets daily, taper by one tablet every other day until off the medication. 04/01/16  Yes York Spaniel, MD  propranolol (INDERAL) 40 MG tablet TAKE 1 TABLET BY MOUTH TWICE A DAY 09/19/15  Yes York Spaniel, MD  PROVENTIL HFA 108 [phone] BASE) MCG/ACT inhaler Inhale 2 puffs into the lungs every 6 (six) hours as needed for wheezing or shortness of breath.  12/28/12  Yes Historical Provider, MD  ranitidine (ZANTAC) 300 MG tablet Take 300 mg by mouth at bedtime as needed for heartburn.  12/04/13  Yes Historical Provider, MD  gabapentin (NEURONTIN) 300 MG capsule Take 1 capsule (300 mg total)  by mouth 2 (two) times daily. Patient not taking: Reported on 04/19/2016 01/30/16   York Spaniel, MD    ROS:  Out of a complete 14 system review of symptoms, the patient complains only of the following symptoms, and all other reviewed systems are negative.  Fatigue Cough, chest tightness Chest pain Heat intolerance Joint pain, back pain, achy muscles, neck pain Numbness  Blood pressure 133/88, pulse 66, height 5\' 3"  (1.6 m), weight 192 lb (87.1 kg).  Physical Exam  General: The patient is alert and cooperative at the time of the examination. The patient is moderately obese.  Neuromuscular: The patient has fairly good range of movement of the low back, some tenderness is noted with palpation of the midline lower back.  Skin: No significant peripheral edema is noted.   Neurologic Exam  Mental status: The patient is alert and oriented x 3 at the time of the examination. The patient has apparent normal recent and remote memory, with an apparently normal attention span and concentration ability.   Cranial nerves: Facial symmetry is present. Speech is normal, no aphasia or dysarthria is noted. Extraocular movements are full. Visual fields are full.  Motor: The patient has good strength in all 4 extremities.  Sensory examination: Soft touch sensation is symmetric on the face, arms, and legs.  Coordination: The patient has good finger-nose-finger and heel-to-shin bilaterally.  Gait and station: The patient has a normal gait. Tandem gait is slightly unsteady. Romberg is negative. No drift is seen.  Reflexes: Deep tendon reflexes are symmetric.   Assessment/Plan:  1. Multiple sclerosis  2. Low back pain, left greater than right leg pain  The patient will be set up for MRI of the low back, she has failed conservative therapy. She was initially seen for the low back pain in September 2017. The patient will remain on Gilenya, blood work will be done today. She will follow-up in  6 months, sooner if needed.  Marlan Palau MD 04/19/2016 12:58 PM  [facility] [address] [place], [place] [address]  Phone [phone] Fax [phone]

## 2016-04-20 LAB — COMPREHENSIVE METABOLIC PANEL
ALK PHOS: 118 IU/L — AB (ref 39–117)
ALT: 30 IU/L (ref 0–32)
AST: 21 IU/L (ref 0–40)
Albumin/Globulin Ratio: 1.5 (ref 1.2–2.2)
Albumin: 3.6 g/dL (ref 3.5–5.5)
BUN/Creatinine Ratio: 9 (ref 9–23)
BUN: 8 mg/dL (ref 6–24)
Bilirubin Total: 0.5 mg/dL (ref 0.0–1.2)
CO2: 27 mmol/L (ref 18–29)
CREATININE: 0.88 mg/dL (ref 0.57–1.00)
Calcium: 9 mg/dL (ref 8.7–10.2)
Chloride: 100 mmol/L (ref 96–106)
GFR calc Af Amer: 90 mL/min/{1.73_m2} (ref >59)
GFR calc non Af Amer: 78 mL/min/{1.73_m2} (ref >59)
GLUCOSE: 86 mg/dL (ref 65–99)
Globulin, Total: 2.4 g/dL (ref 1.5–4.5)
Potassium: 3.7 mmol/L (ref 3.5–5.2)
Sodium: 141 mmol/L (ref 134–144)
Total Protein: 6 g/dL (ref 6.0–8.5)

## 2016-04-20 LAB — CBC WITH DIFFERENTIAL/PLATELET
Basophils Absolute: 0 10*3/uL (ref 0.0–0.2)
Basos: 0 %
EOS (ABSOLUTE): 0.3 10*3/uL (ref 0.0–0.4)
EOS: 4 %
HEMATOCRIT: 45 % (ref 34.0–46.6)
HEMOGLOBIN: 15.4 g/dL (ref 11.1–15.9)
IMMATURE GRANULOCYTES: 1 %
Immature Grans (Abs): 0.1 10*3/uL (ref 0.0–0.1)
Lymphocytes Absolute: 0.3 10*3/uL — ABNORMAL LOW (ref 0.7–3.1)
Lymphs: 5 %
MCH: 30.7 pg (ref 26.6–33.0)
MCHC: 34.2 g/dL (ref 31.5–35.7)
MCV: 90 fL (ref 79–97)
MONOCYTES: 18 %
Monocytes Absolute: 1.3 10*3/uL — ABNORMAL HIGH (ref 0.1–0.9)
Neutrophils Absolute: 5.2 10*3/uL (ref 1.4–7.0)
Neutrophils: 72 %
Platelets: 340 10*3/uL (ref 150–379)
RBC: 5.01 x10E6/uL (ref 3.77–5.28)
RDW: 13.4 % (ref 12.3–15.4)
WBC: 7.2 10*3/uL (ref 3.4–10.8)

## 2016-04-22 ENCOUNTER — Telehealth: Payer: Self-pay

## 2016-04-22 NOTE — Telephone Encounter (Signed)
Called pt w/ lab results and to continue Gilenya treatment. Verbalized understanding and appreciation for call.

## 2016-04-22 NOTE — Telephone Encounter (Signed)
-----  Message from Kathrynn Ducking, MD sent at 04/21/2016  5:48 PM EST -----  The blood work results are unremarkable, with exception of a minimal elevation of Alk. Phos, not clinically significant. Absolute lymphocyte count is 0.3 on Gilenya, OK at this time. Please call the patient. ----- Message ----- From: Lavone Neri Lab Results In Sent: 04/20/2016   5:39 AM To: Kathrynn Ducking, MD

## 2016-04-25 ENCOUNTER — Telehealth: Payer: Self-pay | Admitting: Neurology

## 2016-04-25 NOTE — Telephone Encounter (Signed)
Medicaid did not approve the MRI. It is needing more clinical information, the case number is [id number] and the phone number for the peer to peer is [phone]. She is scheduled right now to have the test done at [facility] on 04/29/16.

## 2016-04-25 NOTE — Telephone Encounter (Signed)
I have called for the Peer to peer review. The MRI of the lumbar spine has been approved, approval number is [id number], expiration date is 05/24/2016.

## 2016-04-26 NOTE — Telephone Encounter (Signed)
Noted, thank you for your help!  °

## 2016-04-29 ENCOUNTER — Other Ambulatory Visit: Payer: Medicaid Other

## 2016-05-06 ENCOUNTER — Ambulatory Visit
Admission: RE | Admit: 2016-05-06 | Discharge: 2016-05-06 | Disposition: A | Discharge status: Home / Self Care | Payer: Medicaid Other | Source: Ambulatory Visit | Attending: Neurology | Admitting: Neurology

## 2016-05-06 ENCOUNTER — Telehealth: Payer: Self-pay | Admitting: Neurology

## 2016-05-06 DIAGNOSIS — M5442 Lumbago with sciatica, left side: Principal | ICD-10-CM

## 2016-05-06 DIAGNOSIS — M48061 Spinal stenosis, lumbar region without neurogenic claudication: Secondary | ICD-10-CM | POA: Diagnosis not present

## 2016-05-06 DIAGNOSIS — G8929 Other chronic pain: Secondary | ICD-10-CM

## 2016-05-06 DIAGNOSIS — M5441 Lumbago with sciatica, right side: Principal | ICD-10-CM

## 2016-05-06 NOTE — Telephone Encounter (Signed)
MRI of the lumbar spine shows potential for bilateral L5 nerve root impingement, and possible impingement of the right S1 nerve root. I discussed the issue with the patient, we will consider an epidural steroid injection of the back, she will call if this is not effective.   MRI lumbar 05/06/16:  IMPRESSION: This MRI of the lumbar spine without contrast shows the following: 1.  At L3-L4, there is a small right paramedian disc protrusion causing mild spinal stenosis and mild right lateral recess stenosis. There does not appear to be any nerve root compression at this level. 2.  At L4-L5, there is a small right paramedian disc herniation and mild facet hypertrophy causing mild spinal stenosis and moderately severe right lateral recess stenosis with potential for right L5 nerve root compression.  There is also moderate left lateral recess stenosis with lesser potential for left L5 nerve root compression. 3.  At L5-S1, there is a right paramedian disc herniation causing moderately severe right foraminal narrowing that could lead to right L5 nerve root compression and also right lateral recess stenosis that posteriorly displaces the right S1 nerve root.

## 2016-05-08 ENCOUNTER — Other Ambulatory Visit: Payer: Self-pay | Admitting: Neurology

## 2016-05-08 DIAGNOSIS — M5442 Lumbago with sciatica, left side: Principal | ICD-10-CM

## 2016-05-08 DIAGNOSIS — G8929 Other chronic pain: Secondary | ICD-10-CM

## 2016-05-08 DIAGNOSIS — M5441 Lumbago with sciatica, right side: Principal | ICD-10-CM

## 2016-05-13 ENCOUNTER — Ambulatory Visit
Admission: RE | Admit: 2016-05-13 | Discharge: 2016-05-13 | Disposition: A | Discharge status: Home / Self Care | Payer: Medicaid Other | Source: Ambulatory Visit | Attending: Neurology | Admitting: Neurology

## 2016-05-13 DIAGNOSIS — M48061 Spinal stenosis, lumbar region without neurogenic claudication: Secondary | ICD-10-CM | POA: Diagnosis not present

## 2016-05-13 DIAGNOSIS — M5441 Lumbago with sciatica, right side: Principal | ICD-10-CM

## 2016-05-13 DIAGNOSIS — G8929 Other chronic pain: Secondary | ICD-10-CM

## 2016-05-13 DIAGNOSIS — M5442 Lumbago with sciatica, left side: Principal | ICD-10-CM

## 2016-05-13 MED ORDER — METHYLPREDNISOLONE ACETATE 40 MG/ML INJ SUSP (RADIOLOG
120.0000 mg | Freq: Once | INTRAMUSCULAR | Status: AC
  Administered 2016-05-13: 120 mg via EPIDURAL

## 2016-05-13 MED ORDER — IOPAMIDOL (ISOVUE-M 200) INJECTION 41%
1.0000 mL | Freq: Once | INTRAMUSCULAR | Status: AC
  Administered 2016-05-13: 1 mL via EPIDURAL

## 2016-05-13 NOTE — Discharge Instructions (Signed)

## 2016-05-23 DIAGNOSIS — J019 Acute sinusitis, unspecified: Secondary | ICD-10-CM | POA: Diagnosis not present

## 2016-07-11 ENCOUNTER — Ambulatory Visit: Payer: Medicaid Other | Admitting: Adult Health

## 2016-07-23 ENCOUNTER — Telehealth: Payer: Self-pay | Admitting: Neurology

## 2016-07-23 DIAGNOSIS — G8929 Other chronic pain: Secondary | ICD-10-CM

## 2016-07-23 DIAGNOSIS — M5440 Lumbago with sciatica, unspecified side: Principal | ICD-10-CM

## 2016-07-23 NOTE — Telephone Encounter (Signed)
Patient calling stating really bad back pain since this past Sunday. She says has had injections at [facility] for pain. Can injections be ordered again. Please call and advise.

## 2016-07-23 NOTE — Telephone Encounter (Signed)
I called the patient. She has had onset of the low back pain over the last 2 days, epidural injection done at the end of January helped until now. I will get another injection set up for her.

## 2016-07-23 NOTE — Addendum Note (Signed)
Addended by: York Spaniel on: 07/23/2016 04:34 PM   Modules accepted: Orders

## 2016-07-26 ENCOUNTER — Other Ambulatory Visit: Payer: Self-pay | Admitting: Neurology

## 2016-07-26 DIAGNOSIS — E669 Obesity, unspecified: Secondary | ICD-10-CM | POA: Diagnosis not present

## 2016-07-26 DIAGNOSIS — M5440 Lumbago with sciatica, unspecified side: Principal | ICD-10-CM

## 2016-07-26 DIAGNOSIS — J329 Chronic sinusitis, unspecified: Secondary | ICD-10-CM | POA: Diagnosis not present

## 2016-07-26 DIAGNOSIS — L97519 Non-pressure chronic ulcer of other part of right foot with unspecified severity: Secondary | ICD-10-CM | POA: Diagnosis not present

## 2016-07-26 DIAGNOSIS — G8929 Other chronic pain: Secondary | ICD-10-CM

## 2016-07-26 DIAGNOSIS — Z6834 Body mass index (BMI) 34.0-34.9, adult: Secondary | ICD-10-CM | POA: Diagnosis not present

## 2016-08-07 ENCOUNTER — Ambulatory Visit
Admission: RE | Admit: 2016-08-07 | Discharge: 2016-08-07 | Disposition: A | Discharge status: Home / Self Care | Payer: Medicaid Other | Source: Ambulatory Visit | Attending: Neurology | Admitting: Neurology

## 2016-08-07 ENCOUNTER — Other Ambulatory Visit: Payer: Self-pay | Admitting: Neurology

## 2016-08-07 DIAGNOSIS — M5440 Lumbago with sciatica, unspecified side: Principal | ICD-10-CM

## 2016-08-07 DIAGNOSIS — M5136 Other intervertebral disc degeneration, lumbar region: Secondary | ICD-10-CM | POA: Diagnosis not present

## 2016-08-07 DIAGNOSIS — G8929 Other chronic pain: Secondary | ICD-10-CM

## 2016-08-07 MED ORDER — METHYLPREDNISOLONE ACETATE 40 MG/ML INJ SUSP (RADIOLOG
120.0000 mg | Freq: Once | INTRAMUSCULAR | Status: AC
  Administered 2016-08-07: 120 mg via EPIDURAL

## 2016-08-07 MED ORDER — IOPAMIDOL (ISOVUE-M 200) INJECTION 41%
1.0000 mL | Freq: Once | INTRAMUSCULAR | Status: AC
  Administered 2016-08-07: 1 mL via EPIDURAL

## 2016-08-07 NOTE — Discharge Instructions (Signed)

## 2016-08-09 ENCOUNTER — Ambulatory Visit (INDEPENDENT_AMBULATORY_CARE_PROVIDER_SITE_OTHER): Payer: Medicaid Other | Admitting: Sports Medicine

## 2016-08-09 ENCOUNTER — Ambulatory Visit (INDEPENDENT_AMBULATORY_CARE_PROVIDER_SITE_OTHER): Payer: Medicaid Other

## 2016-08-09 ENCOUNTER — Telehealth: Payer: Self-pay | Admitting: *Deleted

## 2016-08-09 ENCOUNTER — Encounter: Payer: Self-pay | Admitting: Sports Medicine

## 2016-08-09 DIAGNOSIS — Q742 Other congenital malformations of lower limb(s), including pelvic girdle: Secondary | ICD-10-CM | POA: Diagnosis not present

## 2016-08-09 DIAGNOSIS — G629 Polyneuropathy, unspecified: Secondary | ICD-10-CM

## 2016-08-09 DIAGNOSIS — I739 Peripheral vascular disease, unspecified: Secondary | ICD-10-CM | POA: Diagnosis not present

## 2016-08-09 DIAGNOSIS — R52 Pain, unspecified: Secondary | ICD-10-CM | POA: Diagnosis not present

## 2016-08-09 DIAGNOSIS — M792 Neuralgia and neuritis, unspecified: Secondary | ICD-10-CM

## 2016-08-09 DIAGNOSIS — R252 Cramp and spasm: Secondary | ICD-10-CM

## 2016-08-09 NOTE — Progress Notes (Signed)
Subjective: Sue Sanchez is a 46 y.o. female patient who presents to office for evaluation of right greater than left foot pain. Patient complains of progressive pain since she was a child with a history of clubfoot and amniotic band syndrome. Reports that her toes on the right foot at 1. 4 black for about a year. However, they have gotten better. States that she notices swelling temperature changes and has pain and wants to discuss what can be done about her symptoms. Patient also has a history of MS and has a neurologist in [place].  Patient Active Problem List   Diagnosis Date Noted  . Chronic midline low back pain with left-sided sciatica 04/19/2016  . Classic migraine 01/12/2016  . Abnormality of gait 03/03/2015  . Chronic fatigue 08/31/2014  . B12 deficiency 08/31/2014  . Depression with anxiety 07/22/2013  . MS (multiple sclerosis) (HCC) 02/25/2013    Current Outpatient Prescriptions on File Prior to Visit  Medication Sig Dispense Refill  . budesonide-formoterol (SYMBICORT) 160-4.5 MCG/ACT inhaler Inhale 2 puffs into the lungs 2 (two) times daily.    Marland Kitchen eletriptan (RELPAX) 20 MG tablet Take 20 mg by mouth as needed for migraine or headache. May repeat in 2 hours if headache persists or recurs.    . ergocalciferol (VITAMIN D2) 50000 UNITS capsule Take 50,000 Units by mouth 2 (two) times a week.    . escitalopram (LEXAPRO) 10 MG tablet TAKE 1 TABLET (10 MG TOTAL) BY MOUTH DAILY. 90 tablet 3  . Fingolimod HCl (GILENYA) 0.5 MG CAPS Take 1 capsule (0.5 mg total) by mouth daily. 30 capsule 11  . gabapentin (NEURONTIN) 300 MG capsule Take 1 capsule (300 mg total) by mouth 2 (two) times daily. (Patient not taking: Reported on 04/19/2016) 60 capsule 3  . ibuprofen (ADVIL,MOTRIN) 200 MG tablet Take 3 tablets (600 mg total) by mouth every 6 (six) hours as needed for headache or moderate pain. 30 tablet 0  . nystatin (MYCOSTATIN) 100000 UNIT/ML suspension Take 5 mLs by mouth 4 (four) times  daily as needed. Reported on 04/24/2015  0  . pantoprazole (PROTONIX) 40 MG tablet Take 40 mg by mouth 2 (two) times daily.     . predniSONE (DELTASONE) 10 MG tablet Begin taking 6 tablets daily, taper by one tablet every other day until off the medication. (Patient not taking: Reported on 08/09/2016) 42 tablet 0  . propranolol (INDERAL) 40 MG tablet TAKE 1 TABLET BY MOUTH TWICE A DAY 180 tablet 1  . PROVENTIL HFA 108 (90 BASE) MCG/ACT inhaler Inhale 2 puffs into the lungs every 6 (six) hours as needed for wheezing or shortness of breath.     . ranitidine (ZANTAC) 300 MG tablet Take 300 mg by mouth at bedtime as needed for heartburn.   11   No current facility-administered medications on file prior to visit.     No Known Allergies  Objective:  General: Alert and oriented x3 in no acute distress  Dermatology:Trophic skin changes, right greater than left foot with surgical scars. No open lesions bilateral lower extremities, no webspace macerations, no ecchymosis bilateral, all nails x 10 are well manicured.  Vascular: Dorsalis Pedis and Posterior Tibial pedal pulses difficult to palpate, Capillary Fill Time 5 seconds,(-) pedal hair growth bilateral, trace edema bilateral lower extremities, Temperature gradient within normal limits.  Neurology: Gross sensation intact via light touch bilateral, (- )Tinels sign bilateral. Diminished vibratory sensation bilateral. Diminished protective sensation bilateral.  Musculoskeletal: No reproducible pain, bilateral. There is diffuse changes from  clubfoot deformity with atrophy of legs and fixed hammer toes and partial first and second toe amputation on left. Strength 4 and 5 in all groups bilateral.   Right foot x-rays. Severe fixed hammertoe deformity with diffuse arthritis. No other acute findings.  Assessment and Plan: Problem List Items Addressed This Visit    None    Visit Diagnoses    Pain    -  Primary   Relevant Orders   DG Foot Complete Right    Neuritis       PVD (peripheral vascular disease) (HCC)       Congenital foot abnormality           -Complete examination performed -Xrays reviewed -Discussed treatement options. Congenital foot deformity with secondary neuritis and likely vascular compromise -Rx nerve conduction test and ABIs, PVRs to evaluate possible underlying issues that can contribute to pain --Recommend good supportive shoes and inserts for her feet daily -Patient to return to officeafter nerve and vascular studiesr sooner if condition worsens.  Asencion Islam, DPM

## 2016-08-09 NOTE — Progress Notes (Signed)
   Subjective:    Patient ID: Sue Sanchez, female    DOB: December 03, 1970, 46 y.o.   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      Objective:   Physical Exam        Assessment & Plan:

## 2016-08-09 NOTE — Telephone Encounter (Addendum)
-----   Message from Asencion Islam, [place] sent at 08/09/2016  9:55 AM EDT ----- Regarding: NCV/EMG and ABIs bilateral  ABIs bilateral for PAD  NCV/EMG for suspected neuropathy with cramping patient sees Dr. Anne Hahn at [facility]  Dr Kathie Rhodes. Orders for NCV with EMG faxed to [facility]. Orders for ABI with or without TBI faxed to [facility].08/13/2016-I spoke with pt and asked if could change vascular testing to a [place] facility, pt agreed. Orders faxed to [facility] after [employer] approved [id number], Authorization # [id number], ending date:09/12/2016.08/29/2016-

## 2016-08-19 DIAGNOSIS — J3489 Other specified disorders of nose and nasal sinuses: Secondary | ICD-10-CM | POA: Diagnosis not present

## 2016-08-19 DIAGNOSIS — R51 Headache: Secondary | ICD-10-CM | POA: Diagnosis not present

## 2016-08-19 DIAGNOSIS — G8929 Other chronic pain: Secondary | ICD-10-CM | POA: Diagnosis not present

## 2016-08-19 DIAGNOSIS — J342 Deviated nasal septum: Secondary | ICD-10-CM | POA: Diagnosis not present

## 2016-08-27 ENCOUNTER — Telehealth: Payer: Self-pay | Admitting: Sports Medicine

## 2016-08-27 NOTE — Telephone Encounter (Signed)
I'm scheduled with [facility] at [facility] this Friday 18 May and two additional tests scheduled at [facility] on two different days. I wanted to know if I'm supposed to have all three test. Please call me back.

## 2016-08-27 NOTE — Telephone Encounter (Signed)
I spoke with pt and told her I ordered the nerve conduction test and the circulation testing in [place], but I did not know about the [facility] test and she should contact her primary to check.

## 2016-08-28 ENCOUNTER — Telehealth: Payer: Self-pay | Admitting: Sports Medicine

## 2016-08-28 NOTE — Telephone Encounter (Signed)
Pt states the ABI are scheduled for tomorrow and the Venous duplex later this month. I told her to have the test tomorrow and have them fax the results to our office [phone]. Pt states understanding.

## 2016-08-28 NOTE — Telephone Encounter (Signed)
Yes Sue Sanchez, I spoke to you yesterday about the ABI your office scheduled for this Friday at [facility]. Somehow, my PCP also scheduled an ABI and a Venous study at [facility] on two different days. Do I need to keep those appointments or should I cancel them and just keep the one your office scheduled at [facility]. Please call me back at [phone]. Thank you.

## 2016-08-29 ENCOUNTER — Other Ambulatory Visit: Payer: Self-pay | Admitting: Neurology

## 2016-08-30 ENCOUNTER — Inpatient Hospital Stay (HOSPITAL_COMMUNITY): Admission: RE | Admit: 2016-08-30 | Payer: Medicaid Other | Source: Ambulatory Visit

## 2016-09-04 ENCOUNTER — Other Ambulatory Visit: Payer: Self-pay | Admitting: *Deleted

## 2016-09-04 MED ORDER — FINGOLIMOD HCL 0.5 MG PO CAPS: 0.5000 mg | ORAL_CAPSULE | Freq: Every day | ORAL | 11 refills | Status: DC

## 2016-09-11 ENCOUNTER — Ambulatory Visit: Payer: Medicaid Other | Admitting: Neurology

## 2016-09-11 ENCOUNTER — Ambulatory Visit (INDEPENDENT_AMBULATORY_CARE_PROVIDER_SITE_OTHER): Payer: Medicare Other | Admitting: Neurology

## 2016-09-11 ENCOUNTER — Telehealth: Payer: Self-pay | Admitting: *Deleted

## 2016-09-11 ENCOUNTER — Encounter: Payer: Self-pay | Admitting: Neurology

## 2016-09-11 DIAGNOSIS — R269 Unspecified abnormalities of gait and mobility: Secondary | ICD-10-CM

## 2016-09-11 DIAGNOSIS — M79671 Pain in right foot: Secondary | ICD-10-CM | POA: Diagnosis not present

## 2016-09-11 NOTE — Telephone Encounter (Addendum)
-----   Message from Asencion Islam, [place] sent at 09/11/2016  7:48 AM EDT ----- Regarding: ABIs and Venous Duplex  Can you let patient know that her ABI vascular test to evaluate her arteries and the venous duplex test to evaluate her veins are normal. Since these tests are normal the only other test we can possibly do to see if this explains her pain is a EMG/NCV for the neuritis symptoms she has. If patient wants to do this nerve test then order and then I will see her in the office after this test is completed. If patient does not want to do this test then I will have to refer her to a neurologist or pain management doctor. Thanks Dr. Marylene Land. 09/11/2016-Left message to call for results. Pt returned call states she was having the nerve study when I called this morning. I informed her of Dr. Gabriel Rung review of the circulation test and that once the results were available from the nerve conduction test we would call.

## 2016-09-11 NOTE — Progress Notes (Signed)
Please refer to EMG and nerve conduction study procedure note. 

## 2016-09-11 NOTE — Procedures (Signed)
     HISTORY:  Sue Sanchez is a 46 year old patient with a history of amnionic banding and clubfeet deformity on the right greater than left leg. The patient has had chronic pain in the right foot, numbness below the band line on the right. The patient is being evaluated for this issue. The patient has a history of low back pain that has improved with epidural steroid injections.  NERVE CONDUCTION STUDIES:  Nerve conduction studies were performed on both lower extremities. The studies of the peroneal nerves were unobtainable bilaterally. The study of the right posterior tibial nerve was unobtainable. The left posterior tibial nerve reveals a normal distal motor latency and motor amplitude. The nerve conduction velocity for this nerve was normal. The studies of the peroneal nerves bilaterally with pickup on the tibialis anterior muscle reveals normal distal motor latencies and motor amplitudes. Nerve conduction velocities were normal. The sensory latencies for the right sural and peroneal nerves were unobtainable, these were normal on the left side.  EMG STUDIES:  EMG study was performed on the right lower extremity:  The tibialis anterior muscle reveals 2 to 4K motor units with full recruitment. No fibrillations or positive waves were seen. The peroneus tertius muscle reveals 2 to 4K motor units with decreased recruitment. No fibrillations or positive waves were seen. The medial gastrocnemius muscle reveals 1 to 3K motor units with full recruitment. No fibrillations or positive waves were seen. The vastus lateralis muscle reveals 2 to 4K motor units with full recruitment. No fibrillations or positive waves were seen. The iliopsoas muscle reveals 2 to 4K motor units with full recruitment. No fibrillations or positive waves were seen. The biceps femoris muscle (long head) reveals 1 to 3K motor units with full recruitment. No fibrillations or positive waves were seen. The lumbosacral  paraspinal muscles were tested at 3 levels, and revealed no abnormalities of insertional activity at all 3 levels tested. There was good relaxation.   IMPRESSION:  Nerve conduction studies done on both lower extremities shows absence of distal neuronal function of the peroneal nerves bilaterally and of the right posterior tibial nerve. With more proximal pickup on the peroneal nerves, the studies were normal. EMG evaluation of the right lower extremity shows chronic stable signs of denervation below the area of congenital deformity secondary to amniotic banding, muscles above this level are normal. No evidence of an overlying lumbosacral radiculopathy is seen. The abnormalities seen on nerve conduction study and EMG are likely to be congenital in nature.  Marlan Palau MD 09/11/2016 1:46 PM  [facility] [address] [place], [place] [address]  Phone [phone] Fax [phone]

## 2016-09-13 DIAGNOSIS — J343 Hypertrophy of nasal turbinates: Secondary | ICD-10-CM | POA: Diagnosis not present

## 2016-09-13 DIAGNOSIS — R0981 Nasal congestion: Secondary | ICD-10-CM | POA: Diagnosis not present

## 2016-09-13 DIAGNOSIS — J342 Deviated nasal septum: Secondary | ICD-10-CM | POA: Diagnosis not present

## 2016-09-13 DIAGNOSIS — J3489 Other specified disorders of nose and nasal sinuses: Secondary | ICD-10-CM | POA: Diagnosis not present

## 2016-10-07 DIAGNOSIS — J343 Hypertrophy of nasal turbinates: Secondary | ICD-10-CM | POA: Diagnosis not present

## 2016-10-07 DIAGNOSIS — J342 Deviated nasal septum: Secondary | ICD-10-CM | POA: Diagnosis not present

## 2016-10-07 DIAGNOSIS — R0981 Nasal congestion: Secondary | ICD-10-CM | POA: Diagnosis not present

## 2016-10-07 DIAGNOSIS — J3489 Other specified disorders of nose and nasal sinuses: Secondary | ICD-10-CM | POA: Diagnosis not present

## 2016-10-23 DIAGNOSIS — J3489 Other specified disorders of nose and nasal sinuses: Secondary | ICD-10-CM | POA: Diagnosis not present

## 2016-10-23 DIAGNOSIS — J343 Hypertrophy of nasal turbinates: Secondary | ICD-10-CM | POA: Diagnosis not present

## 2016-10-23 DIAGNOSIS — G43909 Migraine, unspecified, not intractable, without status migrainosus: Secondary | ICD-10-CM | POA: Diagnosis not present

## 2016-10-23 DIAGNOSIS — E559 Vitamin D deficiency, unspecified: Secondary | ICD-10-CM | POA: Diagnosis not present

## 2016-10-23 DIAGNOSIS — Z136 Encounter for screening for cardiovascular disorders: Secondary | ICD-10-CM | POA: Diagnosis not present

## 2016-10-23 DIAGNOSIS — J321 Chronic frontal sinusitis: Secondary | ICD-10-CM | POA: Diagnosis not present

## 2016-10-23 DIAGNOSIS — G35 Multiple sclerosis: Secondary | ICD-10-CM | POA: Diagnosis not present

## 2016-10-23 DIAGNOSIS — Z79899 Other long term (current) drug therapy: Secondary | ICD-10-CM | POA: Diagnosis not present

## 2016-10-23 DIAGNOSIS — F17210 Nicotine dependence, cigarettes, uncomplicated: Secondary | ICD-10-CM | POA: Diagnosis not present

## 2016-10-23 DIAGNOSIS — F419 Anxiety disorder, unspecified: Secondary | ICD-10-CM | POA: Diagnosis not present

## 2016-10-23 DIAGNOSIS — J342 Deviated nasal septum: Secondary | ICD-10-CM | POA: Diagnosis not present

## 2016-10-23 DIAGNOSIS — J323 Chronic sphenoidal sinusitis: Secondary | ICD-10-CM | POA: Diagnosis not present

## 2016-10-23 DIAGNOSIS — J322 Chronic ethmoidal sinusitis: Secondary | ICD-10-CM | POA: Diagnosis not present

## 2016-10-23 DIAGNOSIS — J449 Chronic obstructive pulmonary disease, unspecified: Secondary | ICD-10-CM | POA: Diagnosis not present

## 2016-10-23 DIAGNOSIS — J324 Chronic pansinusitis: Secondary | ICD-10-CM | POA: Diagnosis not present

## 2016-10-23 DIAGNOSIS — J320 Chronic maxillary sinusitis: Secondary | ICD-10-CM | POA: Diagnosis not present

## 2016-10-23 DIAGNOSIS — I10 Essential (primary) hypertension: Secondary | ICD-10-CM | POA: Diagnosis not present

## 2016-10-23 DIAGNOSIS — M1990 Unspecified osteoarthritis, unspecified site: Secondary | ICD-10-CM | POA: Diagnosis not present

## 2016-10-23 DIAGNOSIS — F329 Major depressive disorder, single episode, unspecified: Secondary | ICD-10-CM | POA: Diagnosis not present

## 2016-10-23 DIAGNOSIS — R0981 Nasal congestion: Secondary | ICD-10-CM | POA: Diagnosis not present

## 2016-10-29 ENCOUNTER — Ambulatory Visit: Payer: Medicaid Other | Admitting: Adult Health

## 2016-10-30 ENCOUNTER — Encounter: Payer: Self-pay | Admitting: Adult Health

## 2016-10-31 DIAGNOSIS — J329 Chronic sinusitis, unspecified: Secondary | ICD-10-CM | POA: Diagnosis not present

## 2016-11-04 ENCOUNTER — Ambulatory Visit (INDEPENDENT_AMBULATORY_CARE_PROVIDER_SITE_OTHER): Payer: Medicare Other | Admitting: Adult Health

## 2016-11-04 ENCOUNTER — Encounter: Payer: Self-pay | Admitting: Adult Health

## 2016-11-04 VITALS — BP 150/88 | HR 67 | Ht 63.0 in | Wt 188.6 lb

## 2016-11-04 DIAGNOSIS — Z5181 Encounter for therapeutic drug level monitoring: Secondary | ICD-10-CM | POA: Diagnosis not present

## 2016-11-04 DIAGNOSIS — R269 Unspecified abnormalities of gait and mobility: Secondary | ICD-10-CM | POA: Diagnosis not present

## 2016-11-04 DIAGNOSIS — G35 Multiple sclerosis: Secondary | ICD-10-CM | POA: Diagnosis not present

## 2016-11-04 DIAGNOSIS — G43009 Migraine without aura, not intractable, without status migrainosus: Secondary | ICD-10-CM

## 2016-11-04 NOTE — Progress Notes (Signed)
PATIENT: Sue Sanchez DOB: [birth year]  REASON FOR VISIT: follow up- multiple sclerosis HISTORY FROM: patient  HISTORY OF PRESENT ILLNESS: Today 11/04/16 Sue Sanchez is a 46 year old female with a history of multiple sclerosis. She returns today for follow-up. She remains on Gilenya and is tolerating it well. She denies any new symptoms. She continues to have trouble with her balance and fortunately she is not had any falls. She uses a cane when ambulating. She states on occasion she will have numbness in the feet however it never last greater than 24 hours. She continues to have some right leg pain. She denies any significant changes with the bowels or bladder. She continues to have episodes of incontinence. She reports that she did have an epidural steroid injection in January and that did help with the leg pain. She remains on propranolol for her headaches. She denies any migraine headaches. She did have sinus surgery July 11 and has been alternating taking ibuprofen and Tylenol for the pain. She returns today for an evaluation.  HISTORY 04/19/16: Sue Sanchez is a 46 year old right-handed white female with a history of multiple sclerosis on Gilenya. The patient has had a recent ophthalmologic evaluation that was unremarkable. The patient has had some chronic issues with low back pain with left greater than right lower extremity discomfort. The pain down the left leg will go to the foot, the pain down the right leg will go to the knee. The patient has had courses of prednisone but these treatments have resulted in oral thrush. The patient feels that the legs are slightly weak, she denies any falls. She does have some urinary urgency and incontinence at times. She denies any new symptoms of numbness, weakness, visual changes, or balance. She is using a quad cane for ambulation. She returns to this office for an evaluation.  REVIEW OF SYSTEMS: Out of a complete 14 system review of  symptoms, the patient complains only of the following symptoms, and all other reviewed systems are negative.  See HPI  ALLERGIES: No Known Allergies  HOME MEDICATIONS: Outpatient Medications Prior to Visit  Medication Sig Dispense Refill  . budesonide-formoterol (SYMBICORT) 160-4.5 MCG/ACT inhaler Inhale 2 puffs into the lungs 2 (two) times daily.    Marland Kitchen eletriptan (RELPAX) 20 MG tablet Take 20 mg by mouth as needed for migraine or headache. May repeat in 2 hours if headache persists or recurs.    Marland Kitchen escitalopram (LEXAPRO) 10 MG tablet TAKE 1 TABLET (10 MG TOTAL) BY MOUTH DAILY. 90 tablet 3  . Fingolimod HCl (GILENYA) 0.5 MG CAPS Take 1 capsule (0.5 mg total) by mouth daily. 30 capsule 11  . ibuprofen (ADVIL,MOTRIN) 200 MG tablet Take 3 tablets (600 mg total) by mouth every 6 (six) hours as needed for headache or moderate pain. 30 tablet 0  . pantoprazole (PROTONIX) 40 MG tablet Take 40 mg by mouth 2 (two) times daily.     . propranolol (INDERAL) 40 MG tablet TAKE 1 TABLET BY MOUTH TWICE A DAY 180 tablet 1  . PROVENTIL HFA 108 (90 BASE) MCG/ACT inhaler Inhale 2 puffs into the lungs every 6 (six) hours as needed for wheezing or shortness of breath.     . ranitidine (ZANTAC) 300 MG tablet Take 300 mg by mouth at bedtime as needed for heartburn.   11  . ergocalciferol (VITAMIN D2) 50000 UNITS capsule Take 50,000 Units by mouth 2 (two) times a week.    . gabapentin (NEURONTIN) 300 MG capsule Take 1 capsule (  300 mg total) by mouth 2 (two) times daily. (Patient not taking: Reported on 04/19/2016) 60 capsule 3  . nystatin (MYCOSTATIN) 100000 UNIT/ML suspension Take 5 mLs by mouth 4 (four) times daily as needed. Reported on 04/24/2015  0  . predniSONE (DELTASONE) 10 MG tablet Begin taking 6 tablets daily, taper by one tablet every other day until off the medication. (Patient not taking: Reported on 08/09/2016) 42 tablet 0   No facility-administered medications prior to visit.     PAST MEDICAL  HISTORY: Past Medical History:  Diagnosis Date  . Abnormality of gait 03/03/2015  . B12 deficiency 08/31/2014  . Chronic fatigue 08/31/2014  . Classic migraine 01/12/2016  . COPD (chronic obstructive pulmonary disease) (HCC)   . Gastroesophageal reflux disease   . Migraine   . MS (multiple sclerosis) (HCC)   . Numbness    bilateral lower extremity  . Obesity   . Osteoarthritis    diagnosed by orthopedics  . Vitamin D deficiency     PAST SURGICAL HISTORY: Past Surgical History:  Procedure Laterality Date  . CESAREAN SECTION    . COSMETIC SURGERY     lower right leg  . FOOT SURGERY     Club feet  . NASAL SINUS SURGERY     10/23/2016,,  dev septum, other  . TUBAL LIGATION Bilateral     FAMILY HISTORY: Family History  Problem Relation Age of Onset  . Diabetes Mother   . Bipolar disorder Sister   . Bipolar disorder Brother   . Cancer Maternal Grandmother   . COPD Paternal Grandfather   . Heart disease Paternal Grandfather     SOCIAL HISTORY: Social History   Social History  . Marital status: Married    Spouse name: N/A  . Number of children: 2  . Years of education: college   Occupational History  . Unemployed    Social History Main Topics  . Smoking status: Current Every Day Smoker    Packs/day: 0.50    Years: 30.00    Types: Cigarettes  . Smokeless tobacco: Never Used  . Alcohol use No  . Drug use: No  . Sexual activity: Yes   Other Topics Concern  . Not on file   Social History Narrative   Patient is Divorced with 2 children.   Patient has a college education.   Patient is right handed.   Caffeine Intake: 6 or more sodas daily      Patient has a dog named Butters.      PHYSICAL EXAM  Vitals:   11/04/16 0712  BP: (!) 150/88  Pulse: 67  Weight: 188 lb 9.6 oz (85.5 kg)  Height: 5\' 3"  (1.6 m)   Body mass index is 33.41 kg/m.  Generalized: Well developed, in no acute distress   Neurological examination  Mentation: Alert oriented to  time, place, history taking. Follows all commands speech and language fluent Cranial nerve II-XII: Pupils were equal round reactive to light. Extraocular movements were full, visual field were full on confrontational test. Facial sensation and strength were normal. Uvula tongue midline. Head turning and shoulder shrug  were normal and symmetric. Motor: The motor testing reveals 5 over 5 strength of all 4 extremitiesWith the exception of slight weakness in the proximal muscle of the right lower extremity. Sensory: Sensory testing is intact to soft touch on all 4 extremities. No evidence of extinction is noted.  Coordination: Cerebellar testing reveals good finger-nose-finger and heel-to-shin bilaterally.  Gait and station: Patient uses a  cane when ambulating. Tandem gait not attempted. Romberg is negative. Reflexes: Deep tendon reflexes are symmetric and normal bilaterally.   DIAGNOSTIC DATA (LABS, IMAGING, TESTING) - I reviewed patient records, labs, notes, testing and imaging myself where available.  Lab Results  Component Value Date   WBC 7.2 04/19/2016   HGB 15.4 04/19/2016   HCT 45.0 04/19/2016   MCV 90 04/19/2016   PLT 340 04/19/2016      Component Value Date/Time   NA 141 04/19/2016 1301   K 3.7 04/19/2016 1301   CL 100 04/19/2016 1301   CO2 27 04/19/2016 1301   GLUCOSE 86 04/19/2016 1301   BUN 8 04/19/2016 1301   CREATININE 0.88 04/19/2016 1301   CALCIUM 9.0 04/19/2016 1301   PROT 6.0 04/19/2016 1301   ALBUMIN 3.6 04/19/2016 1301   AST 21 04/19/2016 1301   ALT 30 04/19/2016 1301   ALKPHOS 118 (H) 04/19/2016 1301   BILITOT 0.5 04/19/2016 1301   GFRNONAA 78 04/19/2016 1301   GFRAA 90 04/19/2016 1301      ASSESSMENT AND PLAN 46 y.o. year old female  has a past medical history of Abnormality of gait (03/03/2015); B12 deficiency (08/31/2014); Chronic fatigue (08/31/2014); Classic migraine (01/12/2016); COPD (chronic obstructive pulmonary disease) (HCC); Gastroesophageal  reflux disease; Migraine; MS (multiple sclerosis) (HCC); Numbness; Obesity; Osteoarthritis; and Vitamin D deficiency. here with:  1. Multiple sclerosis 2. Abnormality of gait 3. Migraine headaches  Overall the patient has remained stable. She will continue on Gilenya. I will check blood work today. Her last MRI of the brain was in May 2017. We will repeat an MRI of the brain. She will remain on propranolol for migraine headaches. She is advised that if her symptoms worsen or she develops new symptoms she should let us know. She will follow-up in 6 months or sooner if needed.     Butch Penny, MSN, NP-C 11/04/2016, 7:32 AM [facility] [address], [address] [place], [place] [address] [phone]

## 2016-11-04 NOTE — Patient Instructions (Addendum)
Your Plan:  Continue Gilenya Blood work  MRI brain  If your symptoms worsen or you develop new symptoms please let us know.    Thank you for coming to see [place] at [facility]. I hope we have been able to provide you high quality care today.  You may receive a patient satisfaction survey over the next few weeks. We would appreciate your feedback and comments so that we may continue to improve ourselves and the health of our patients.

## 2016-11-04 NOTE — Progress Notes (Signed)
I have read the note, and I agree with the clinical assessment and plan.  Addi Pak KEITH   

## 2016-11-05 ENCOUNTER — Telehealth: Payer: Self-pay | Admitting: *Deleted

## 2016-11-05 LAB — COMPREHENSIVE METABOLIC PANEL
ALBUMIN: 4 g/dL (ref 3.5–5.5)
ALK PHOS: 126 IU/L — AB (ref 39–117)
ALT: 39 IU/L — ABNORMAL HIGH (ref 0–32)
AST: 30 IU/L (ref 0–40)
Albumin/Globulin Ratio: 1.8 (ref 1.2–2.2)
BUN / CREAT RATIO: 7 — AB (ref 9–23)
BUN: 6 mg/dL (ref 6–24)
Bilirubin Total: 0.2 mg/dL (ref 0.0–1.2)
CHLORIDE: 103 mmol/L (ref 96–106)
CO2: 24 mmol/L (ref 20–29)
Calcium: 9 mg/dL (ref 8.7–10.2)
Creatinine, Ser: 0.84 mg/dL (ref 0.57–1.00)
GFR calc Af Amer: 96 mL/min/{1.73_m2} (ref >59)
GFR calc non Af Amer: 83 mL/min/{1.73_m2} (ref >59)
GLUCOSE: 84 mg/dL (ref 65–99)
Globulin, Total: 2.2 g/dL (ref 1.5–4.5)
Potassium: 3.7 mmol/L (ref 3.5–5.2)
Sodium: 141 mmol/L (ref 134–144)
Total Protein: 6.2 g/dL (ref 6.0–8.5)

## 2016-11-05 LAB — CBC WITH DIFFERENTIAL/PLATELET
BASOS ABS: 0 10*3/uL (ref 0.0–0.2)
Basos: 1 %
EOS (ABSOLUTE): 0.4 10*3/uL (ref 0.0–0.4)
Eos: 8 %
Hematocrit: 42.5 % (ref 34.0–46.6)
Hemoglobin: 13.8 g/dL (ref 11.1–15.9)
Immature Grans (Abs): 0 10*3/uL (ref 0.0–0.1)
Immature Granulocytes: 0 %
LYMPHS ABS: 0.6 10*3/uL — AB (ref 0.7–3.1)
Lymphs: 12 %
MCH: 30.8 pg (ref 26.6–33.0)
MCHC: 32.5 g/dL (ref 31.5–35.7)
MCV: 95 fL (ref 79–97)
MONOCYTES: 10 %
Monocytes Absolute: 0.5 10*3/uL (ref 0.1–0.9)
Neutrophils Absolute: 3.8 10*3/uL (ref 1.4–7.0)
Neutrophils: 69 %
Platelets: 309 10*3/uL (ref 150–379)
RBC: 4.48 x10E6/uL (ref 3.77–5.28)
RDW: 13.8 % (ref 12.3–15.4)
WBC: 5.4 10*3/uL (ref 3.4–10.8)

## 2016-11-05 NOTE — Telephone Encounter (Signed)
Spoke with patient and informed her that her lab work is relatively unremarkable. Advised her that her Alkaline phosphatase remains elevated, and the ALT is also elevated but has been elevated in the past.  Advised her that Aundra Millet will continue to monitor.  She verbalized understanding, appreciation.

## 2016-11-12 DIAGNOSIS — J329 Chronic sinusitis, unspecified: Secondary | ICD-10-CM | POA: Diagnosis not present

## 2016-11-17 ENCOUNTER — Ambulatory Visit
Admission: RE | Admit: 2016-11-17 | Discharge: 2016-11-17 | Disposition: A | Discharge status: Home / Self Care | Payer: Medicaid Other | Source: Ambulatory Visit | Attending: Adult Health | Admitting: Adult Health

## 2016-11-17 DIAGNOSIS — G35 Multiple sclerosis: Secondary | ICD-10-CM

## 2016-11-17 MED ORDER — GADOBENATE DIMEGLUMINE 529 MG/ML IV SOLN
17.0000 mL | Freq: Once | INTRAVENOUS | Status: AC | PRN
  Administered 2016-11-17: 17 mL via INTRAVENOUS

## 2016-11-18 ENCOUNTER — Telehealth: Payer: Self-pay | Admitting: *Deleted

## 2016-11-18 NOTE — Telephone Encounter (Signed)
LVM requesting call back re: MRI results. MRI results successfully faxed to patient's PCP per Dolores Hoose, NP.

## 2016-11-19 DIAGNOSIS — J329 Chronic sinusitis, unspecified: Secondary | ICD-10-CM | POA: Diagnosis not present

## 2016-11-19 NOTE — Telephone Encounter (Signed)
Spoke with patient and informed her that there was no interval change in her MRI since the MRI on 09/07/15. Advised she does have chronic pansinusitis that has developed since that scan.  Patient stated she had sinus surgery in July, saw her ENT today. She stated her ENT told her "it would show up until she was completely healed". This RN advised her the MRI report was faxed to her PCP.  Patient verbalized understanding, appreciation.

## 2016-12-09 DIAGNOSIS — Z6832 Body mass index (BMI) 32.0-32.9, adult: Secondary | ICD-10-CM | POA: Diagnosis not present

## 2016-12-09 DIAGNOSIS — R05 Cough: Secondary | ICD-10-CM | POA: Diagnosis not present

## 2016-12-27 ENCOUNTER — Other Ambulatory Visit: Payer: Self-pay | Admitting: Neurology

## 2017-01-01 DIAGNOSIS — H5712 Ocular pain, left eye: Secondary | ICD-10-CM | POA: Diagnosis not present

## 2017-01-01 DIAGNOSIS — Z09 Encounter for follow-up examination after completed treatment for conditions other than malignant neoplasm: Secondary | ICD-10-CM | POA: Diagnosis not present

## 2017-01-01 DIAGNOSIS — Z049 Encounter for examination and observation for unspecified reason: Secondary | ICD-10-CM | POA: Diagnosis not present

## 2017-01-01 DIAGNOSIS — G35 Multiple sclerosis: Secondary | ICD-10-CM | POA: Diagnosis not present

## 2017-01-01 DIAGNOSIS — Z79899 Other long term (current) drug therapy: Secondary | ICD-10-CM | POA: Diagnosis not present

## 2017-02-24 DIAGNOSIS — J3489 Other specified disorders of nose and nasal sinuses: Secondary | ICD-10-CM | POA: Diagnosis not present

## 2017-02-24 DIAGNOSIS — Z9889 Other specified postprocedural states: Secondary | ICD-10-CM | POA: Diagnosis not present

## 2017-02-24 DIAGNOSIS — J329 Chronic sinusitis, unspecified: Secondary | ICD-10-CM | POA: Diagnosis not present

## 2017-03-31 ENCOUNTER — Other Ambulatory Visit: Payer: Self-pay | Admitting: *Deleted

## 2017-03-31 MED ORDER — PROPRANOLOL HCL 40 MG PO TABS: 40.0000 mg | ORAL_TABLET | Freq: Two times a day (BID) | ORAL | 1 refills | Status: DC

## 2017-04-16 ENCOUNTER — Other Ambulatory Visit: Payer: Self-pay | Admitting: *Deleted

## 2017-04-16 MED ORDER — ESCITALOPRAM OXALATE 10 MG PO TABS: ORAL_TABLET | ORAL | 0 refills | Status: DC

## 2017-05-08 ENCOUNTER — Ambulatory Visit (INDEPENDENT_AMBULATORY_CARE_PROVIDER_SITE_OTHER): Payer: Medicare Other | Admitting: Adult Health

## 2017-05-08 ENCOUNTER — Encounter: Payer: Self-pay | Admitting: Adult Health

## 2017-05-08 VITALS — BP 120/78 | HR 68 | Ht 63.0 in | Wt 185.8 lb

## 2017-05-08 DIAGNOSIS — R197 Diarrhea, unspecified: Secondary | ICD-10-CM | POA: Diagnosis not present

## 2017-05-08 DIAGNOSIS — G35 Multiple sclerosis: Secondary | ICD-10-CM | POA: Diagnosis not present

## 2017-05-08 DIAGNOSIS — Z5181 Encounter for therapeutic drug level monitoring: Secondary | ICD-10-CM

## 2017-05-08 NOTE — Patient Instructions (Signed)
Your Plan:  Continue Gilenya  Blood work today  If your symptoms worsen or you develop new symptoms please let us know.   Thank you for coming to see us at [facility]. I hope we have been able to provide you high quality care today.  You may receive a patient satisfaction survey over the next few weeks. We would appreciate your feedback and comments so that we may continue to improve ourselves and the health of our patients.  

## 2017-05-08 NOTE — Progress Notes (Signed)
I have read the note, and I agree with the clinical assessment and plan.  Burnham Trost K Clarisse Rodriges   

## 2017-05-08 NOTE — Progress Notes (Signed)
PATIENT: Sue Sanchez DOB: 1970/12/27  REASON FOR VISIT: follow up HISTORY FROM: patient  HISTORY OF PRESENT ILLNESS: Today 05/08/17 Sue Sanchez is a 47 year old female with a history of multiple sclerosis.  She returns today for follow-up.  She remains on Gilenya and is tolerating it well.  She reports on occasion she will have numbness in the left leg.  This occurs intermittently but typically does not last very long.  She states that this is been ongoing for several months.  She also reports that she is under a lot of stress she is unsure of that may be contributing to her symptoms.  She denies any significant changes with her bowel or bladder.  She does report urinary urgency at times.  Denies any significant changes with her gait or balance.  Denies any changes with her vision.  She remains on Lexapro but does report that she is under a lot of stress due to her family and sometimes her depression is worse.  She does states that she has been having diarrhea this is been chronic for several years.  She states that typically happens as soon as she eats food.  She returns today for an evaluation.   HISTORY Sue Sanchez is a 47 year old female with a history of multiple sclerosis. She returns today for follow-up. She remains on Gilenya and is tolerating it well. She denies any new symptoms. She continues to have trouble with her balance and fortunately she is not had any falls. She uses a cane when ambulating. She states on occasion she will have numbness in the feet however it never last greater than 24 hours. She continues to have some right leg pain. She denies any significant changes with the bowels or bladder. She continues to have episodes of incontinence. She reports that she did have an epidural steroid injection in January and that did help with the leg pain. She remains on propranolol for her headaches. She denies any migraine headaches. She did have sinus surgery July 11 and has  been alternating taking ibuprofen and Tylenol for the pain. She returns today for an evaluation.  REVIEW OF SYSTEMS: Out of a complete 14 system review of symptoms, the patient complains only of the following symptoms, and all other reviewed systems are negative.  See HPI  ALLERGIES: No Known Allergies  HOME MEDICATIONS: Outpatient Medications Prior to Visit  Medication Sig Dispense Refill  . budesonide-formoterol (SYMBICORT) 160-4.5 MCG/ACT inhaler Inhale 2 puffs into the lungs 2 (two) times daily.    Marland Kitchen eletriptan (RELPAX) 20 MG tablet Take 20 mg by mouth as needed for migraine or headache. May repeat in 2 hours if headache persists or recurs.    . ergocalciferol (VITAMIN D2) 50000 UNITS capsule Take 50,000 Units by mouth 2 (two) times a week.    . escitalopram (LEXAPRO) 10 MG tablet TAKE 1 TABLET (10 MG TOTAL) BY MOUTH DAILY. 90 tablet 0  . Fingolimod HCl (GILENYA) 0.5 MG CAPS Take 1 capsule (0.5 mg total) by mouth daily. 30 capsule 11  . ibuprofen (ADVIL,MOTRIN) 200 MG tablet Take 3 tablets (600 mg total) by mouth every 6 (six) hours as needed for headache or moderate pain. 30 tablet 0  . pantoprazole (PROTONIX) 40 MG tablet Take 40 mg by mouth 2 (two) times daily.     . propranolol (INDERAL) 40 MG tablet Take 1 tablet (40 mg total) by mouth 2 (two) times daily. 180 tablet 1  . PROVENTIL HFA 108 (90 BASE) MCG/ACT inhaler  Inhale 2 puffs into the lungs every 6 (six) hours as needed for wheezing or shortness of breath.     . ranitidine (ZANTAC) 300 MG tablet Take 300 mg by mouth at bedtime as needed for heartburn.   11   No facility-administered medications prior to visit.     PAST MEDICAL HISTORY: Past Medical History:  Diagnosis Date  . Abnormality of gait 03/03/2015  . B12 deficiency 08/31/2014  . Chronic fatigue 08/31/2014  . Classic migraine 01/12/2016  . COPD (chronic obstructive pulmonary disease) (HCC)   . Gastroesophageal reflux disease   . Migraine   . MS (multiple  sclerosis) (HCC)   . Numbness    bilateral lower extremity  . Obesity   . Osteoarthritis    diagnosed by orthopedics  . Vitamin D deficiency     PAST SURGICAL HISTORY: Past Surgical History:  Procedure Laterality Date  . CESAREAN SECTION    . COSMETIC SURGERY     lower right leg  . FOOT SURGERY     Club feet  . NASAL SINUS SURGERY     10/23/2016,,  dev septum, other  . TUBAL LIGATION Bilateral     FAMILY HISTORY: Family History  Problem Relation Age of Onset  . Diabetes Mother   . Bipolar disorder Sister   . Bipolar disorder Brother   . Cancer Maternal Grandmother   . COPD Paternal Grandfather   . Heart disease Paternal Grandfather     SOCIAL HISTORY: Social History   Socioeconomic History  . Marital status: Married    Spouse name: Not on file  . Number of children: 2  . Years of education: college  . Highest education level: Not on file  Social Needs  . Financial resource strain: Not on file  . Food insecurity - worry: Not on file  . Food insecurity - inability: Not on file  . Transportation needs - medical: Not on file  . Transportation needs - non-medical: Not on file  Occupational History  . Occupation: Unemployed  Tobacco Use  . Smoking status: Current Every Day Smoker    Packs/day: 0.50    Years: 30.00    Pack years: 15.00    Types: Cigarettes  . Smokeless tobacco: Never Used  Substance and Sexual Activity  . Alcohol use: No    Alcohol/week: 0.0 oz  . Drug use: No  . Sexual activity: Yes  Other Topics Concern  . Not on file  Social History Narrative   Patient is Divorced with 2 children.   Patient has a college education.   Patient is right handed.   Caffeine Intake: 6 or more sodas daily      Patient has a dog named Butters.      PHYSICAL EXAM  Vitals:   05/08/17 1009  BP: 120/78  Pulse: 68  Weight: 185 lb 12.8 oz (84.3 kg)  Height: 5\' 3"  (1.6 m)   Body mass index is 32.91 kg/m.  Generalized: Well developed, in no acute  distress   Neurological examination  Mentation: Alert oriented to time, place, history taking. Follows all commands speech and language fluent Cranial nerve II-XII: Pupils were equal round reactive to light. Extraocular movements were full, visual field were full on confrontational test. Facial sensation and strength were normal. Uvula tongue midline. Head turning and shoulder shrug  were normal and symmetric. Motor: The motor testing reveals 5 over 5 strength of all 4 extremities. Good symmetric motor tone is noted throughout.  Sensory: Sensory testing is intact  to soft touch on all 4 extremities. No evidence of extinction is noted.  Coordination: Cerebellar testing reveals good finger-nose-finger and heel-to-shin bilaterally.  Gait and station: Gait is normal. Tandem gait is normal. Romberg is negative. No drift is seen.  Reflexes: Deep tendon reflexes are symmetric and normal bilaterally.   DIAGNOSTIC DATA (LABS, IMAGING, TESTING) - I reviewed patient records, labs, notes, testing and imaging myself where available.  Lab Results  Component Value Date   WBC 5.4 11/04/2016   HGB 13.8 11/04/2016   HCT 42.5 11/04/2016   MCV 95 11/04/2016   PLT 309 11/04/2016      Component Value Date/Time   NA 141 11/04/2016 1002   K 3.7 11/04/2016 1002   CL 103 11/04/2016 1002   CO2 24 11/04/2016 1002   GLUCOSE 84 11/04/2016 1002   BUN 6 11/04/2016 1002   CREATININE 0.84 11/04/2016 1002   CALCIUM 9.0 11/04/2016 1002   PROT 6.2 11/04/2016 1002   ALBUMIN 4.0 11/04/2016 1002   AST 30 11/04/2016 1002   ALT 39 (H) 11/04/2016 1002   ALKPHOS 126 (H) 11/04/2016 1002   BILITOT 0.2 11/04/2016 1002   GFRNONAA 83 11/04/2016 1002   GFRAA 96 11/04/2016 1002     ASSESSMENT AND PLAN 47 y.o. year old female  has a past medical history of Abnormality of gait (03/03/2015), B12 deficiency (08/31/2014), Chronic fatigue (08/31/2014), Classic migraine (01/12/2016), COPD (chronic obstructive pulmonary disease)  (HCC), Gastroesophageal reflux disease, Migraine, MS (multiple sclerosis) (HCC), Numbness, Obesity, Osteoarthritis, and Vitamin D deficiency. here with:  1.  Multiple sclerosis 2.  Diarrhea  Overall the patient is doing well.  She will continue on Gilenya.  I will check blood work today.  Her physical exam is relatively unremarkable.  Her MRI 6 months ago did not show any new lesions.  I advised that she should consult with her primary care provider regarding ongoing diarrhea.  She may need a GI referral.  She is advised that if her symptoms worsen or she develops new symptoms she should let us know.  She will follow-up in 6 months or sooner if needed.    Butch Penny, MSN, NP-C 05/08/2017, 9:52 AM [facility] [address], [address] [place], [place] [address] [phone]

## 2017-05-09 LAB — COMPREHENSIVE METABOLIC PANEL
ALT: 19 IU/L (ref 0–32)
AST: 19 IU/L (ref 0–40)
Albumin/Globulin Ratio: 1.7 (ref 1.2–2.2)
Albumin: 4.3 g/dL (ref 3.5–5.5)
Alkaline Phosphatase: 128 IU/L — ABNORMAL HIGH (ref 39–117)
BILIRUBIN TOTAL: 0.6 mg/dL (ref 0.0–1.2)
BUN/Creatinine Ratio: 11 (ref 9–23)
BUN: 9 mg/dL (ref 6–24)
CALCIUM: 9.4 mg/dL (ref 8.7–10.2)
CHLORIDE: 104 mmol/L (ref 96–106)
CO2: 21 mmol/L (ref 20–29)
Creatinine, Ser: 0.82 mg/dL (ref 0.57–1.00)
GFR calc Af Amer: 98 mL/min/{1.73_m2} (ref >59)
GFR, EST NON AFRICAN AMERICAN: 85 mL/min/{1.73_m2} (ref >59)
GLUCOSE: 99 mg/dL (ref 65–99)
Globulin, Total: 2.5 g/dL (ref 1.5–4.5)
Potassium: 4.5 mmol/L (ref 3.5–5.2)
Sodium: 138 mmol/L (ref 134–144)
TOTAL PROTEIN: 6.8 g/dL (ref 6.0–8.5)

## 2017-05-09 LAB — CBC WITH DIFFERENTIAL/PLATELET
BASOS ABS: 0 10*3/uL (ref 0.0–0.2)
Basos: 0 %
EOS (ABSOLUTE): 0.4 10*3/uL (ref 0.0–0.4)
Eos: 6 %
HEMOGLOBIN: 15.2 g/dL (ref 11.1–15.9)
Hematocrit: 43.8 % (ref 34.0–46.6)
IMMATURE GRANS (ABS): 0 10*3/uL (ref 0.0–0.1)
IMMATURE GRANULOCYTES: 1 %
LYMPHS: 18 %
Lymphocytes Absolute: 1.1 10*3/uL (ref 0.7–3.1)
MCH: 30.8 pg (ref 26.6–33.0)
MCHC: 34.7 g/dL (ref 31.5–35.7)
MCV: 89 fL (ref 79–97)
MONOCYTES: 2 %
Monocytes Absolute: 0.1 10*3/uL (ref 0.1–0.9)
NEUTROS ABS: 4.8 10*3/uL (ref 1.4–7.0)
Neutrophils: 73 %
Platelets: 311 10*3/uL (ref 150–379)
RBC: 4.93 x10E6/uL (ref 3.77–5.28)
RDW: 13.3 % (ref 12.3–15.4)
WBC: 6.5 10*3/uL (ref 3.4–10.8)

## 2017-05-13 ENCOUNTER — Telehealth: Payer: Self-pay | Admitting: *Deleted

## 2017-05-13 NOTE — Telephone Encounter (Signed)
LVM informing patient that her labs showed Alkaline phosphatase is slightly elevated. It has been elevated in the past. Advised her Aundra Millet will continue to monitor for now.  Left number for any questions.

## 2017-07-21 ENCOUNTER — Telehealth: Payer: Self-pay | Admitting: Adult Health

## 2017-07-21 NOTE — Telephone Encounter (Signed)
Spoke to pt at home #  after leaving VM for her on her mobile about appt tomorrow.   She will come in tomorrow at 1130 for eval.

## 2017-07-21 NOTE — Telephone Encounter (Signed)
Pt states last Wednesday she began have a tight sensation in the collar bone down to the stomach but it wraps around her body. She has pain in shoulder, arms, hands tingle, all joints hurt, and problems with BM. She feels this feels different than a MS hugs. Please call to advise.

## 2017-07-21 NOTE — Telephone Encounter (Signed)
Ok to schedule.

## 2017-07-21 NOTE — Telephone Encounter (Signed)
Spoke to pt and she states that from Wednesday has had progressive sx of MS hug around breast (hard to breathe) but has radiated to shoulders and hands R> L tingling.  Joints hurt, irritable. Has diarrhea.  Night time bladder incontinence since Friday. Tylenol/ motrin has not helped. No muscle spasms.  Does not think UTI.  ? MS exacerbation.  Please advise.

## 2017-07-22 ENCOUNTER — Ambulatory Visit (INDEPENDENT_AMBULATORY_CARE_PROVIDER_SITE_OTHER): Payer: Medicare Other | Admitting: Adult Health

## 2017-07-22 ENCOUNTER — Encounter: Payer: Self-pay | Admitting: Adult Health

## 2017-07-22 ENCOUNTER — Telehealth: Payer: Self-pay | Admitting: Adult Health

## 2017-07-22 DIAGNOSIS — G35 Multiple sclerosis: Secondary | ICD-10-CM

## 2017-07-22 MED ORDER — CYCLOBENZAPRINE HCL 5 MG PO TABS: 5.0000 mg | ORAL_TABLET | Freq: Every evening | ORAL | 0 refills | Status: DC | PRN

## 2017-07-22 NOTE — Patient Instructions (Signed)
Your Plan:  Continue Gilenya MRI brain and cervical spine If your symptoms worsen or you develop new symptoms please let us know.    Thank you for coming to see [place] at [facility]. I hope we have been able to provide you high quality care today.  You may receive a patient satisfaction survey over the next few weeks. We would appreciate your feedback and comments so that we may continue to improve ourselves and the health of our patients.

## 2017-07-22 NOTE — Telephone Encounter (Signed)
Medicare/medicaid order sent to GI. No auth and they will reach out to the pt to schedule.

## 2017-07-22 NOTE — Progress Notes (Signed)
PATIENT: Sue Sanchez DOB: 10-Dec-1970  REASON FOR VISIT: follow up HISTORY FROM: patient  HISTORY OF PRESENT ILLNESS: Today 07/22/17 Sue Sanchez is a 47 year old female with a history of multiple.  She returns today for follow-up.  She states that starting last Wednesday she felt that she was having MS hug.  She states that the squeezing sensation has subsided but now she is having pain in the center of the back that radiates to the right side and to the right shoulder.  She reports that she also was experiencing numbness in the left arm but this has subsided.  She still has some numbness in the right arm but this is improving.  She also states that she has noted urinary incontinence at night but this only happened Friday and Saturday night.  She continues to have diarrhea.  She has not followed up with her PCP regarding this.  She denies any changes in her gait or balance.  Denies any changes with her vision.  She reports that she has been on oral steroids in the past but this gives her thrush.  She prefers not to have steroids unless absolutely needed.  She also reports diffuse joint pain.  She continues on Gilenya.  Reports that she has been on Flexeril in the past and it has been beneficial.  Returns today for evaluation.  HISTORY 05/08/17: 05/08/17 Sue Sanchez is a 48 year old female with a history of multiple sclerosis.  She returns today for follow-up.  She remains on Gilenya and is tolerating it well.  She reports on occasion she will have numbness in the left leg.  This occurs intermittently but typically does not last very long.  She states that this is been ongoing for several months.  She also reports that she is under a lot of stress she is unsure of that may be contributing to her symptoms.  She denies any significant changes with her bowel or bladder.  She does report urinary urgency at times.  Denies any significant changes with her gait or balance.  Denies any changes  with her vision.  She remains on Lexapro but does report that she is under a lot of stress due to her family and sometimes her depression is worse.  She does states that she has been having diarrhea this is been chronic for several years.  She states that typically happens as soon as she eats food.  She returns today for an evaluation.     REVIEW OF SYSTEMS: Out of a complete 14 system review of symptoms, the patient complains only of the following symptoms, and all other reviewed systems are negative.  Cough, runny nose  ALLERGIES: No Known Allergies  HOME MEDICATIONS: Outpatient Medications Prior to Visit  Medication Sig Dispense Refill  . budesonide-formoterol (SYMBICORT) 160-4.5 MCG/ACT inhaler Inhale 2 puffs into the lungs 2 (two) times daily.    Marland Kitchen eletriptan (RELPAX) 20 MG tablet Take 20 mg by mouth as needed for migraine or headache. May repeat in 2 hours if headache persists or recurs.    Marland Kitchen escitalopram (LEXAPRO) 10 MG tablet TAKE 1 TABLET (10 MG TOTAL) BY MOUTH DAILY. 90 tablet 0  . Fingolimod HCl (GILENYA) 0.5 MG CAPS Take 1 capsule (0.5 mg total) by mouth daily. 30 capsule 11  . ibuprofen (ADVIL,MOTRIN) 200 MG tablet Take 3 tablets (600 mg total) by mouth every 6 (six) hours as needed for headache or moderate pain. 30 tablet 0  . propranolol (INDERAL) 40 MG tablet Take  1 tablet (40 mg total) by mouth 2 (two) times daily. 180 tablet 1  . PROVENTIL HFA 108 (90 BASE) MCG/ACT inhaler Inhale 2 puffs into the lungs every 6 (six) hours as needed for wheezing or shortness of breath.     . ranitidine (ZANTAC) 300 MG tablet Take 300 mg by mouth at bedtime as needed for heartburn.   11  . Vitamin D, Ergocalciferol, (DRISDOL) 50000 units CAPS capsule Take 50,000 Units by mouth. Taking 2 times a week     No facility-administered medications prior to visit.     PAST MEDICAL HISTORY: Past Medical History:  Diagnosis Date  . Abnormality of gait 03/03/2015  . B12 deficiency 08/31/2014  .  Chronic fatigue 08/31/2014  . Classic migraine 01/12/2016  . COPD (chronic obstructive pulmonary disease) (HCC)   . Gastroesophageal reflux disease   . Migraine   . MS (multiple sclerosis) (HCC)   . Numbness    bilateral lower extremity  . Obesity   . Osteoarthritis    diagnosed by orthopedics  . Vitamin D deficiency     PAST SURGICAL HISTORY: Past Surgical History:  Procedure Laterality Date  . CESAREAN SECTION    . COSMETIC SURGERY     lower right leg  . FOOT SURGERY     Club feet  . NASAL SINUS SURGERY     10/23/2016,,  dev septum, other  . TUBAL LIGATION Bilateral     FAMILY HISTORY: Family History  Problem Relation Age of Onset  . Diabetes Mother   . Bipolar disorder Sister   . Bipolar disorder Brother   . Cancer Maternal Grandmother   . COPD Paternal Grandfather   . Heart disease Paternal Grandfather     SOCIAL HISTORY: Social History   Socioeconomic History  . Marital status: Married    Spouse name: Not on file  . Number of children: 2  . Years of education: college  . Highest education level: Not on file  Occupational History  . Occupation: Unemployed  Social Needs  . Financial resource strain: Not on file  . Food insecurity:    Worry: Not on file    Inability: Not on file  . Transportation needs:    Medical: Not on file    Non-medical: Not on file  Tobacco Use  . Smoking status: Current Every Day Smoker    Packs/day: 0.50    Years: 30.00    Pack years: 15.00    Types: Cigarettes  . Smokeless tobacco: Never Used  Substance and Sexual Activity  . Alcohol use: No    Alcohol/week: 0.0 oz  . Drug use: No  . Sexual activity: Yes  Lifestyle  . Physical activity:    Days per week: Not on file    Minutes per session: Not on file  . Stress: Not on file  Relationships  . Social connections:    Talks on phone: Not on file    Gets together: Not on file    Attends religious service: Not on file    Active member of club or organization: Not on  file    Attends meetings of clubs or organizations: Not on file    Relationship status: Not on file  . Intimate partner violence:    Fear of current or ex partner: Not on file    Emotionally abused: Not on file    Physically abused: Not on file    Forced sexual activity: Not on file  Other Topics Concern  . Not on  file  Social History Narrative   Patient is Divorced with 2 children.   Patient has a college education.   Patient is right handed.   Caffeine Intake: 6 or more sodas daily      Patient has a dog named Butters.      PHYSICAL EXAM  Vitals:   07/22/17 1108  BP: 134/88  Pulse: 69  Weight: 185 lb (83.9 kg)  Height: 5\' 3"  (1.6 m)   Body mass index is 32.77 kg/m.  Generalized: Well developed, in no acute distress   Neurological examination  Mentation: Alert oriented to time, place, history taking. Follows all commands speech and language fluent Cranial nerve II-XII: Pupils were equal round reactive to light. Extraocular movements were full, visual field were full on confrontational test. Facial sensation and strength were normal. Uvula tongue midline. Head turning and shoulder shrug  were normal and symmetric. Motor: The motor testing reveals 5 over 5 strength of all 4 extremities.  Giveaway weakness noted in the lower extremities.  Patient has pain to palpation in the center of the back to the right of the spine. Sensory: Sensory testing is intact to soft touch on all 4 extremities. No evidence of extinction is noted.  Coordination: Cerebellar testing reveals good finger-nose-finger and heel-to-shin bilaterally.  Gait and station: Gait is normal. Tandem gait is normal. Romberg is negative. No drift is seen.  Reflexes: Deep tendon reflexes are symmetric and normal bilaterally.   DIAGNOSTIC DATA (LABS, IMAGING, TESTING) - I reviewed patient records, labs, notes, testing and imaging myself where available.  Lab Results  Component Value Date   WBC 6.5 05/08/2017    HGB 15.2 05/08/2017   HCT 43.8 05/08/2017   MCV 89 05/08/2017   PLT 311 05/08/2017      Component Value Date/Time   NA 138 05/08/2017 1050   K 4.5 05/08/2017 1050   CL 104 05/08/2017 1050   CO2 21 05/08/2017 1050   GLUCOSE 99 05/08/2017 1050   BUN 9 05/08/2017 1050   CREATININE 0.82 05/08/2017 1050   CALCIUM 9.4 05/08/2017 1050   PROT 6.8 05/08/2017 1050   ALBUMIN 4.3 05/08/2017 1050   AST 19 05/08/2017 1050   ALT 19 05/08/2017 1050   ALKPHOS 128 (H) 05/08/2017 1050   BILITOT 0.6 05/08/2017 1050   GFRNONAA 85 05/08/2017 1050   GFRAA 98 05/08/2017 1050      ASSESSMENT AND PLAN 47 y.o. year old female  has a past medical history of Abnormality of gait (03/03/2015), B12 deficiency (08/31/2014), Chronic fatigue (08/31/2014), Classic migraine (01/12/2016), COPD (chronic obstructive pulmonary disease) (HCC), Gastroesophageal reflux disease, Migraine, MS (multiple sclerosis) (HCC), Numbness, Obesity, Osteoarthritis, and Vitamin D deficiency. here with:  1.  Multiple sclerosis-possible exacerbation  The patient will continue on Gilenya.  The patient had a flareup of symptoms started last Wednesday.  It has improved.  Due to the symptoms we will repeat an MRI of the brain and cervical spine to look for any new lesions.  She will be given a prescription of Flexeril 5 mg to take at bedtime for muscle spasms.  She is advised that if her symptoms worsen she should let us know.  She will keep her scheduled appointment in August.   Butch Penny, MSN, NP-C 07/22/2017, 11:27 AM [facility] [address] [place], [place] [address] [phone]

## 2017-07-22 NOTE — Progress Notes (Signed)
I have read the note, and I agree with the clinical assessment and plan.  Charles K Willis   

## 2017-07-25 ENCOUNTER — Other Ambulatory Visit: Payer: Self-pay | Admitting: Adult Health

## 2017-07-30 NOTE — Telephone Encounter (Signed)
error 

## 2017-08-02 ENCOUNTER — Ambulatory Visit
Admission: RE | Admit: 2017-08-02 | Discharge: 2017-08-02 | Disposition: A | Discharge status: Home / Self Care | Payer: Medicare Other | Source: Ambulatory Visit | Attending: Adult Health | Admitting: Adult Health

## 2017-08-02 DIAGNOSIS — G35 Multiple sclerosis: Secondary | ICD-10-CM

## 2017-08-02 MED ORDER — GADOBENATE DIMEGLUMINE 529 MG/ML IV SOLN
17.0000 mL | Freq: Once | INTRAVENOUS | Status: AC | PRN
  Administered 2017-08-02: 17 mL via INTRAVENOUS

## 2017-08-05 ENCOUNTER — Telehealth: Payer: Self-pay | Admitting: *Deleted

## 2017-08-05 NOTE — Telephone Encounter (Addendum)
Called pt & LVM (ok per DPR) informing pt that her MRI brain and MRI cervical spine both showed no change compared to previous scans. Left office number in message encouraging pt to call with any questions but that a return call is not required.    Result Notes for MR BRAIN W WO CONTRAST   ----- Message from Butch Penny, NP sent at 08/05/2017  7:26 AM EDT ----- MRI shows no change compared to previous scan. Please call patient   Result Notes for MR CERVICAL SPINE W WO CONTRAST   Notes recorded by Butch Penny, NP on 08/05/2017 at 7:26 AM EDT MRI shows no change compared to previous scan. Please call patient

## 2017-09-01 ENCOUNTER — Other Ambulatory Visit: Payer: Self-pay | Admitting: *Deleted

## 2017-09-01 MED ORDER — FINGOLIMOD HCL 0.5 MG PO CAPS: 0.5000 mg | ORAL_CAPSULE | Freq: Every day | ORAL | 11 refills | Status: DC

## 2017-10-09 ENCOUNTER — Other Ambulatory Visit: Payer: Self-pay | Admitting: Adult Health

## 2017-10-09 ENCOUNTER — Other Ambulatory Visit: Payer: Self-pay | Admitting: Neurology

## 2017-11-11 ENCOUNTER — Ambulatory Visit: Payer: Medicare Other | Admitting: Adult Health

## 2017-11-20 ENCOUNTER — Encounter: Payer: Self-pay | Admitting: Adult Health

## 2017-11-20 ENCOUNTER — Ambulatory Visit (INDEPENDENT_AMBULATORY_CARE_PROVIDER_SITE_OTHER): Payer: Medicare Other | Admitting: Adult Health

## 2017-11-20 VITALS — BP 146/84 | HR 71 | Ht 63.0 in | Wt 192.8 lb

## 2017-11-20 DIAGNOSIS — E559 Vitamin D deficiency, unspecified: Secondary | ICD-10-CM

## 2017-11-20 DIAGNOSIS — R400 Somnolence: Secondary | ICD-10-CM | POA: Diagnosis not present

## 2017-11-20 DIAGNOSIS — G35 Multiple sclerosis: Secondary | ICD-10-CM | POA: Diagnosis not present

## 2017-11-20 DIAGNOSIS — R51 Headache: Secondary | ICD-10-CM | POA: Diagnosis not present

## 2017-11-20 DIAGNOSIS — R519 Headache, unspecified: Secondary | ICD-10-CM

## 2017-11-20 DIAGNOSIS — R0683 Snoring: Secondary | ICD-10-CM | POA: Diagnosis not present

## 2017-11-20 DIAGNOSIS — Z5181 Encounter for therapeutic drug level monitoring: Secondary | ICD-10-CM

## 2017-11-20 MED ORDER — CYCLOBENZAPRINE HCL 5 MG PO TABS: 10.0000 mg | ORAL_TABLET | Freq: Every evening | ORAL | 5 refills | Status: DC | PRN

## 2017-11-20 NOTE — Patient Instructions (Addendum)
Your Plan:  Continue Gilenya Increase flexeril to 10 mg at bedtime if needed Blood work today If your symptoms worsen or you develop new symptoms please let us know.    Thank you for coming to see [place] at [facility]. I hope we have been able to provide you high quality care today.  You may receive a patient satisfaction survey over the next few weeks. We would appreciate your feedback and comments so that we may continue to improve ourselves and the health of our patients.

## 2017-11-20 NOTE — Progress Notes (Signed)
PATIENT: Sue Sanchez DOB: 02-18-1971  REASON FOR VISIT: follow up HISTORY FROM: patient  HISTORY OF PRESENT ILLNESS: Today 11/20/17 Ms. Rule is a 47 year old female with a history of multiple sclerosis.  She returns today for follow-up.  She states that Flexeril has been somewhat helpful for her muscle spasms.  She reports that she continues to have neck pain.  She states typically in the afternoons around 4-5 PM she will develop a headache in the occipital region.  Some days are worse than others.  She denies any new numbness or weakness.  She states over time she feels that her legs have gotten weaker.  Denies any changes with her gait or balance.  She does report that sometimes her legs feel heavy when ambulating.  Denies any falls.  No changes in her vision.  No changes in the bowels or bladder.  She does have incontinence at times when she sneezes or coughs.  She reports that she feels sleepy throughout the day.  She reports that she does snore.  In the past she has been referred for a sleep evaluation but at the time her insurance did not cover this.  She returns today for evaluation.  HISTORY 07/22/17 [name] is a 47 year old female with a history of multiple.  She returns today for follow-up.  She states that starting last Wednesday she felt that she was having MS hug.  She states that the squeezing sensation has subsided but now she is having pain in the center of the back that radiates to the right side and to the right shoulder.  She reports that she also was experiencing numbness in the left arm but this has subsided.  She still has some numbness in the right arm but this is improving.  She also states that she has noted urinary incontinence at night but this only happened Friday and Saturday night.  She continues to have diarrhea.  She has not followed up with her PCP regarding this.  She denies any changes in her gait or balance.  Denies any changes with her vision.   She reports that she has been on oral steroids in the past but this gives her thrush.  She prefers not to have steroids unless absolutely needed.  She also reports diffuse joint pain.  She continues on Gilenya.  Reports that she has been on Flexeril in the past and it has been beneficial.  Returns today for evaluation.  REVIEW OF SYSTEMS: Out of a complete 14 system review of symptoms, the patient complains only of the following symptoms, and all other reviewed systems are negative.   See HPI  ALLERGIES: No Known Allergies  HOME MEDICATIONS: Outpatient Medications Prior to Visit  Medication Sig Dispense Refill  . budesonide-formoterol (SYMBICORT) 160-4.5 MCG/ACT inhaler Inhale 2 puffs into the lungs 2 (two) times daily.    . cyclobenzaprine (FLEXERIL) 5 MG tablet TAKE 1 TABLET (5 MG TOTAL) BY MOUTH AT BEDTIME AS NEEDED FOR MUSCLE SPASMS. 30 tablet 3  . eletriptan (RELPAX) 20 MG tablet Take 20 mg by mouth as needed for migraine or headache. May repeat in 2 hours if headache persists or recurs.    Marland Kitchen escitalopram (LEXAPRO) 10 MG tablet TAKE 1 TABLET BY MOUTH EVERY DAY 90 tablet 3  . Fingolimod HCl (GILENYA) 0.5 MG CAPS Take 1 capsule (0.5 mg total) by mouth daily. 30 capsule 11  . ibuprofen (ADVIL,MOTRIN) 200 MG tablet Take 3 tablets (600 mg total) by mouth every 6 (six)  hours as needed for headache or moderate pain. 30 tablet 0  . propranolol (INDERAL) 40 MG tablet TAKE 1 TABLET BY MOUTH TWICE A DAY 180 tablet 3  . PROVENTIL HFA 108 (90 BASE) MCG/ACT inhaler Inhale 2 puffs into the lungs every 6 (six) hours as needed for wheezing or shortness of breath.     . ranitidine (ZANTAC) 300 MG tablet Take 300 mg by mouth at bedtime as needed for heartburn.   11  . Vitamin D, Ergocalciferol, (DRISDOL) 50000 units CAPS capsule Take 50,000 Units by mouth. Taking 2 times a week     No facility-administered medications prior to visit.     PAST MEDICAL HISTORY: Past Medical History:  Diagnosis Date  .  Abnormality of gait 03/03/2015  . B12 deficiency 08/31/2014  . Chronic fatigue 08/31/2014  . Classic migraine 01/12/2016  . COPD (chronic obstructive pulmonary disease) (HCC)   . Gastroesophageal reflux disease   . Migraine   . MS (multiple sclerosis) (HCC)   . Numbness    bilateral lower extremity  . Obesity   . Osteoarthritis    diagnosed by orthopedics  . Vitamin D deficiency     PAST SURGICAL HISTORY: Past Surgical History:  Procedure Laterality Date  . CESAREAN SECTION    . COSMETIC SURGERY     lower right leg  . FOOT SURGERY     Club feet  . NASAL SINUS SURGERY     10/23/2016,,  dev septum, other  . TUBAL LIGATION Bilateral     FAMILY HISTORY: Family History  Problem Relation Age of Onset  . Diabetes Mother   . Bipolar disorder Sister   . Bipolar disorder Brother   . Cancer Maternal Grandmother   . COPD Paternal Grandfather   . Heart disease Paternal Grandfather     SOCIAL HISTORY: Social History   Socioeconomic History  . Marital status: Married    Spouse name: Not on file  . Number of children: 2  . Years of education: college  . Highest education level: Not on file  Occupational History  . Occupation: Unemployed  Social Needs  . Financial resource strain: Not on file  . Food insecurity:    Worry: Not on file    Inability: Not on file  . Transportation needs:    Medical: Not on file    Non-medical: Not on file  Tobacco Use  . Smoking status: Current Every Day Smoker    Packs/day: 0.50    Years: 30.00    Pack years: 15.00    Types: Cigarettes  . Smokeless tobacco: Never Used  Substance and Sexual Activity  . Alcohol use: No    Alcohol/week: 0.0 standard drinks  . Drug use: No  . Sexual activity: Yes  Lifestyle  . Physical activity:    Days per week: Not on file    Minutes per session: Not on file  . Stress: Not on file  Relationships  . Social connections:    Talks on phone: Not on file    Gets together: Not on file    Attends  religious service: Not on file    Active member of club or organization: Not on file    Attends meetings of clubs or organizations: Not on file    Relationship status: Not on file  . Intimate partner violence:    Fear of current or ex partner: Not on file    Emotionally abused: Not on file    Physically abused: Not on file  Forced sexual activity: Not on file  Other Topics Concern  . Not on file  Social History Narrative   Patient is Divorced with 2 children.   Patient has a college education.   Patient is right handed.   Caffeine Intake: 6 or more sodas daily      Patient has a dog named Butters.      PHYSICAL EXAM  Vitals:   11/20/17 0801  BP: (!) 146/84  Pulse: 71  Weight: 192 lb 12.8 oz (87.5 kg)  Height: 5\' 3"  (1.6 m)   Body mass index is 34.15 kg/m.  Generalized: Well developed, in no acute distress   Neurological examination  Mentation: Alert oriented to time, place, history taking. Follows all commands speech and language fluent Cranial nerve II-XII: Pupils were equal round reactive to light. Extraocular movements were full, visual field were full on confrontational test. Facial sensation and strength were normal. Uvula tongue midline. Head turning and shoulder shrug  were normal and symmetric. Motor: The motor testing reveals 5 over 5 strength of all 4 extremities. Good symmetric motor tone is noted throughout.  Sensory: Sensory testing is intact to soft touch on all 4 extremities. No evidence of extinction is noted.  Coordination: Cerebellar testing reveals good finger-nose-finger and heel-to-shin bilaterally.  Gait and station: Gait is normal.  Reflexes: Deep tendon reflexes are symmetric and normal bilaterally.   DIAGNOSTIC DATA (LABS, IMAGING, TESTING) - I reviewed patient records, labs, notes, testing and imaging myself where available.  Lab Results  Component Value Date   WBC 6.5 05/08/2017   HGB 15.2 05/08/2017   HCT 43.8 05/08/2017   MCV 89  05/08/2017   PLT 311 05/08/2017      Component Value Date/Time   NA 138 05/08/2017 1050   K 4.5 05/08/2017 1050   CL 104 05/08/2017 1050   CO2 21 05/08/2017 1050   GLUCOSE 99 05/08/2017 1050   BUN 9 05/08/2017 1050   CREATININE 0.82 05/08/2017 1050   CALCIUM 9.4 05/08/2017 1050   PROT 6.8 05/08/2017 1050   ALBUMIN 4.3 05/08/2017 1050   AST 19 05/08/2017 1050   ALT 19 05/08/2017 1050   ALKPHOS 128 (H) 05/08/2017 1050   BILITOT 0.6 05/08/2017 1050   GFRNONAA 85 05/08/2017 1050   GFRAA 98 05/08/2017 1050      ASSESSMENT AND PLAN 47 y.o. year old female  has a past medical history of Abnormality of gait (03/03/2015), B12 deficiency (08/31/2014), Chronic fatigue (08/31/2014), Classic migraine (01/12/2016), COPD (chronic obstructive pulmonary disease) (HCC), Gastroesophageal reflux disease, Migraine, MS (multiple sclerosis) (HCC), Numbness, Obesity, Osteoarthritis, and Vitamin D deficiency. here with:  1.  Multiple sclerosis 2.  Snoring 3.  Daytime sleepiness 4.  History of vitamin D deficiency  The patient will continue on Gilenya.  I will check blood work today.  She had repeat MRI of the brain and cervical spine in April that was relatively unremarkable.  She will be referred for sleep evaluation to rule out obstructive sleep apnea.  I will recheck a vitamin D level today.  I advised the patient that she can increase Flexeril to 10 mg at bedtime.  She can split her dose and take 5 mg earlier in the afternoon and 5 mg at bedtime to see if this also helps with her headaches.  Patient voiced understanding.  If Flexeril makes her drowsy she will need to take the entire dose at bedtime.  Patient voiced understanding she will follow-up in 6 months or sooner if needed.  Butch Penny, MSN, NP-C 11/20/2017, 8:31 AM [facility] [address] [place], [place] [address] [phone]

## 2017-11-20 NOTE — Progress Notes (Signed)
I have read the note, and I agree with the clinical assessment and plan.  Marjorie Lussier K Yassin Scales   

## 2017-11-21 LAB — COMPREHENSIVE METABOLIC PANEL
ALK PHOS: 124 IU/L — AB (ref 39–117)
ALT: 20 IU/L (ref 0–32)
AST: 18 IU/L (ref 0–40)
Albumin/Globulin Ratio: 1.8 (ref 1.2–2.2)
Albumin: 4.1 g/dL (ref 3.5–5.5)
BILIRUBIN TOTAL: 0.4 mg/dL (ref 0.0–1.2)
BUN/Creatinine Ratio: 13 (ref 9–23)
BUN: 12 mg/dL (ref 6–24)
CHLORIDE: 102 mmol/L (ref 96–106)
CO2: 24 mmol/L (ref 20–29)
Calcium: 9.2 mg/dL (ref 8.7–10.2)
Creatinine, Ser: 0.92 mg/dL (ref 0.57–1.00)
GFR calc Af Amer: 85 mL/min/{1.73_m2} (ref >59)
GFR calc non Af Amer: 73 mL/min/{1.73_m2} (ref >59)
GLUCOSE: 100 mg/dL — AB (ref 65–99)
Globulin, Total: 2.3 g/dL (ref 1.5–4.5)
POTASSIUM: 4.2 mmol/L (ref 3.5–5.2)
Sodium: 138 mmol/L (ref 134–144)
Total Protein: 6.4 g/dL (ref 6.0–8.5)

## 2017-11-21 LAB — CBC WITH DIFFERENTIAL/PLATELET
BASOS ABS: 0 10*3/uL (ref 0.0–0.2)
Basos: 1 %
EOS (ABSOLUTE): 0.3 10*3/uL (ref 0.0–0.4)
Eos: 5 %
HEMOGLOBIN: 15 g/dL (ref 11.1–15.9)
Hematocrit: 45.8 % (ref 34.0–46.6)
Immature Grans (Abs): 0 10*3/uL (ref 0.0–0.1)
Immature Granulocytes: 1 %
Lymphocytes Absolute: 0.8 10*3/uL (ref 0.7–3.1)
Lymphs: 12 %
MCH: 30.8 pg (ref 26.6–33.0)
MCHC: 32.8 g/dL (ref 31.5–35.7)
MCV: 94 fL (ref 79–97)
MONOCYTES: 9 %
MONOS ABS: 0.6 10*3/uL (ref 0.1–0.9)
Neutrophils Absolute: 4.7 10*3/uL (ref 1.4–7.0)
Neutrophils: 72 %
PLATELETS: 306 10*3/uL (ref 150–450)
RBC: 4.87 x10E6/uL (ref 3.77–5.28)
RDW: 13.4 % (ref 12.3–15.4)
WBC: 6.5 10*3/uL (ref 3.4–10.8)

## 2017-11-21 LAB — VITAMIN D 25 HYDROXY (VIT D DEFICIENCY, FRACTURES): VIT D 25 HYDROXY: 18.5 ng/mL — AB (ref 30.0–100.0)

## 2017-11-24 ENCOUNTER — Telehealth: Payer: Self-pay | Admitting: Adult Health

## 2017-11-24 MED ORDER — CHOLECALCIFEROL 25 MCG (1000 UT) PO CAPS: 1000.0000 [IU] | ORAL_CAPSULE | Freq: Every day | ORAL | 11 refills | Status: DC

## 2017-11-24 NOTE — Addendum Note (Signed)
Addended by: Enedina Finner on: 11/24/2017 03:47 PM   Modules accepted: Orders

## 2017-11-24 NOTE — Telephone Encounter (Signed)
Attempted to reach the patient on her mobile number and was connected with her VM. Check DPR(ok to leave detailed vm). I advised of NP's note, recommendation and location to pick the Vitamin D supplement up at. I requested a call back from the patient if she would like to discuss further.   MB RN

## 2017-11-24 NOTE — Telephone Encounter (Signed)
Please call patient let her know that her vitamin D level is low.  Needs to take vitamin D supplement.  Please advised that I have called in a prescription to her pharmacy.  Her vitamin D will need to be rechecked in 3 months.

## 2017-11-25 DIAGNOSIS — Z6834 Body mass index (BMI) 34.0-34.9, adult: Secondary | ICD-10-CM | POA: Diagnosis not present

## 2017-11-25 DIAGNOSIS — J449 Chronic obstructive pulmonary disease, unspecified: Secondary | ICD-10-CM | POA: Diagnosis not present

## 2017-12-31 DIAGNOSIS — F4323 Adjustment disorder with mixed anxiety and depressed mood: Secondary | ICD-10-CM | POA: Diagnosis not present

## 2018-01-01 DIAGNOSIS — G35 Multiple sclerosis: Secondary | ICD-10-CM | POA: Diagnosis not present

## 2018-01-01 DIAGNOSIS — Z79899 Other long term (current) drug therapy: Secondary | ICD-10-CM | POA: Diagnosis not present

## 2018-01-07 DIAGNOSIS — F4323 Adjustment disorder with mixed anxiety and depressed mood: Secondary | ICD-10-CM | POA: Diagnosis not present

## 2018-01-14 DIAGNOSIS — F4323 Adjustment disorder with mixed anxiety and depressed mood: Secondary | ICD-10-CM | POA: Diagnosis not present

## 2018-01-20 ENCOUNTER — Telehealth: Payer: Self-pay | Admitting: Neurology

## 2018-01-20 NOTE — Telephone Encounter (Signed)
The patient has been seen by Dr. Ernesto Rutherford on 01 January 2018.  No evidence of macular edema on Gilenya.

## 2018-01-28 DIAGNOSIS — F4323 Adjustment disorder with mixed anxiety and depressed mood: Secondary | ICD-10-CM | POA: Diagnosis not present

## 2018-02-10 ENCOUNTER — Ambulatory Visit (INDEPENDENT_AMBULATORY_CARE_PROVIDER_SITE_OTHER): Payer: Medicare Other | Admitting: Neurology

## 2018-02-10 ENCOUNTER — Encounter: Payer: Self-pay | Admitting: Neurology

## 2018-02-10 VITALS — BP 160/108 | HR 76 | Ht 63.0 in | Wt 190.0 lb

## 2018-02-10 DIAGNOSIS — R0683 Snoring: Secondary | ICD-10-CM

## 2018-02-10 DIAGNOSIS — E669 Obesity, unspecified: Secondary | ICD-10-CM | POA: Diagnosis not present

## 2018-02-10 DIAGNOSIS — G35 Multiple sclerosis: Secondary | ICD-10-CM

## 2018-02-10 DIAGNOSIS — R51 Headache: Secondary | ICD-10-CM | POA: Diagnosis not present

## 2018-02-10 DIAGNOSIS — G4719 Other hypersomnia: Secondary | ICD-10-CM

## 2018-02-10 DIAGNOSIS — R519 Headache, unspecified: Secondary | ICD-10-CM

## 2018-02-10 NOTE — Patient Instructions (Signed)

## 2018-02-10 NOTE — Progress Notes (Signed)
Subjective:    Patient ID: Sue Sanchez is a 47 y.o. female.  HPI     Huston Foley, MD, PhD [facility] [address], [address] [address] [place], [place] [address]  Dear Sue Sanchez and Sue Sanchez,   I saw your patient, Sue Sanchez, upon your kind request, in my clinic today for initial consultation of her sleep disorder, in particular, concern for underlying obstructive sleep apnea. The patient is unaccompanied today. As you know, Sue Sanchez is a 47 year old right-handed woman with an underlying medical history of MS, migraine headaches, B12 deficiency, COPD, reflux disease, osteoarthritis, vitamin D deficiency, and obesity, who reports snoring and excessive daytime somnolence. I reviewed your office note from 11/20/2017. Her Epworth sleepiness score is 16 out of 24 today, fatigue score is 58 out of 63. She is single and lives with her children, she has 2 kids, and 88 yo GS, as well as pt's boyfriend. She smokes one pack a day on average, she does not utilize alcohol on a regular basis, she drinks caffeine in the form of soda, about 36 ounces per day on average. She has occasional morning HAs, nocturia not nightly, no FHx of OSA. Has woken herself up with a sense of gasping. She takes care of her 49 yo GS. Tries to be in bed by 10:30 or 11, often not asleep until MN.  Sometimes she takes over-the-counter Advil but typically no sleep aid. Rise time depends on her 33-year-old grandson. Typically she is up between 7:30 and 8:30 AM.  His Past Medical History Is Significant For: Past Medical History:  Diagnosis Date  . Abnormality of gait 03/03/2015  . B12 deficiency 08/31/2014  . Chronic fatigue 08/31/2014  . Classic migraine 01/12/2016  . COPD (chronic obstructive pulmonary disease) (HCC)   . Gastroesophageal reflux disease   . Migraine   . MS (multiple sclerosis) (HCC)   . Numbness    bilateral lower extremity  . Obesity   . Osteoarthritis    diagnosed by  orthopedics  . Vitamin D deficiency     His Past Surgical History Is Significant For: Past Surgical History:  Procedure Laterality Date  . CESAREAN SECTION    . COSMETIC SURGERY     lower right leg  . FOOT SURGERY     Club feet  . NASAL SINUS SURGERY     10/23/2016,,  dev septum, other  . TUBAL LIGATION Bilateral     His Family History Is Significant For: Family History  Problem Relation Age of Onset  . Diabetes Mother   . Bipolar disorder Sister   . Bipolar disorder Brother   . Cancer Maternal Grandmother   . COPD Paternal Grandfather   . Heart disease Paternal Grandfather     His Social History Is Significant For: Social History   Socioeconomic History  . Marital status: Married    Spouse name: Not on file  . Number of children: 2  . Years of education: college  . Highest education level: Not on file  Occupational History  . Occupation: Unemployed  Social Needs  . Financial resource strain: Not on file  . Food insecurity:    Worry: Not on file    Inability: Not on file  . Transportation needs:    Medical: Not on file    Non-medical: Not on file  Tobacco Use  . Smoking status: Current Every Day Smoker    Packs/day: 0.50    Years: 30.00    Pack years: 15.00  Types: Cigarettes  . Smokeless tobacco: Never Used  Substance and Sexual Activity  . Alcohol use: No    Alcohol/week: 0.0 standard drinks  . Drug use: No  . Sexual activity: Yes  Lifestyle  . Physical activity:    Days per week: Not on file    Minutes per session: Not on file  . Stress: Not on file  Relationships  . Social connections:    Talks on phone: Not on file    Gets together: Not on file    Attends religious service: Not on file    Active member of club or organization: Not on file    Attends meetings of clubs or organizations: Not on file    Relationship status: Not on file  Other Topics Concern  . Not on file  Social History Narrative   Patient is Divorced with 2 children.    Patient has a college education.   Patient is right handed.   Caffeine Intake: 6 or more sodas daily      Patient has a dog named Sue Sanchez.    His Allergies Are:  No Known Allergies:   His Current Medications Are:  Outpatient Encounter Medications as of 02/10/2018  Medication Sig  . budesonide-formoterol (SYMBICORT) 160-4.5 MCG/ACT inhaler Inhale 2 puffs into the lungs 2 (two) times daily.  . Cholecalciferol 1000 units capsule Take 1 capsule (1,000 Units total) by mouth daily.  . cyclobenzaprine (FLEXERIL) 5 MG tablet Take 2 tablets (10 mg total) by mouth at bedtime as needed for muscle spasms.  Marland Kitchen escitalopram (LEXAPRO) 10 MG tablet TAKE 1 TABLET BY MOUTH EVERY DAY  . Fingolimod HCl (GILENYA) 0.5 MG CAPS Take 1 capsule (0.5 mg total) by mouth daily.  Marland Kitchen ibuprofen (ADVIL,MOTRIN) 200 MG tablet Take 3 tablets (600 mg total) by mouth every 6 (six) hours as needed for headache or moderate pain.  Marland Kitchen propranolol (INDERAL) 40 MG tablet TAKE 1 TABLET BY MOUTH TWICE A DAY  . PROVENTIL HFA 108 (90 BASE) MCG/ACT inhaler Inhale 2 puffs into the lungs every 6 (six) hours as needed for wheezing or shortness of breath.   . ranitidine (ZANTAC) 300 MG tablet Take 300 mg by mouth at bedtime as needed for heartburn.   . [DISCONTINUED] eletriptan (RELPAX) 20 MG tablet Take 20 mg by mouth as needed for migraine or headache. May repeat in 2 hours if headache persists or recurs.   No facility-administered encounter medications on file as of 02/10/2018.   :  Review of Systems:  Out of a complete 14 point review of systems, all are reviewed and negative with the exception of these symptoms as listed below: Review of Systems  Neurological:       Pt presents today to discuss her sleep. Pt has never had a sleep study but does endorse snoring.  Epworth Sleepiness Scale 0= would never doze 1= slight chance of dozing 2= moderate chance of dozing 3= high chance of dozing  Sitting and reading: 3 Watching TV:  3 Sitting inactive in a public place (ex. Theater or meeting): 2 As a passenger in a car for an hour without a break: 2 Lying down to rest in the afternoon: 3 Sitting and talking to someone: 1 Sitting quietly after lunch (no alcohol): 1 In a car, while stopped in traffic: 1 Total: 16     Objective:  Neurological Exam  Physical Exam Physical Examination:   Vitals:   02/10/18 1318  BP: (!) 160/108  Pulse: 76  General Examination: The patient is a very pleasant 47 y.o. female in no acute distress. He appears well-developed and well-nourished and well groomed.   HEENT: Normocephalic, atraumatic, pupils are equal, round and reactive to light and accommodation. Corrective eye glasses. Extraocular tracking is good without limitation to gaze excursion or nystagmus noted. Normal smooth pursuit is noted. Hearing is grossly intact. Face is symmetric with normal facial animation and normal facial sensation. Speech is clear with no dysarthria noted. There is no hypophonia. There is no lip, neck/head, jaw or voice tremor. Neck is supple with full range of passive and active motion. There are no carotid bruits on auscultation. Oropharynx exam reveals: mild mouth dryness, adequate dental hygiene and mild airway crowding, due to smaller airway entry. Mallampati is class I. Tongue protrudes centrally and palate elevates symmetrically. Tonsils are small. Neck size is 16 inches. She has a Mild overbite.    Chest: Clear to auscultation without wheezing, rhonchi or crackles noted.  Heart: S1+S2+0, regular and normal without murmurs, rubs or gallops noted.   Abdomen: Soft, non-tender and non-distended with normal bowel sounds appreciated on auscultation.  Extremities: There is no pitting edema in the distal lower extremities bilaterally.   Skin: Warm and dry without trophic changes noted.  Musculoskeletal: exam reveals no obvious joint deformities, tenderness or joint swelling or erythema.    Neurologically:  Mental status: The patient is awake, alert and oriented in all 4 spheres. Her immediate and remote memory, attention, language skills and fund of knowledge are appropriate. There is no evidence of aphasia, agnosia, apraxia or anomia. Speech is clear with normal prosody and enunciation. Thought process is linear. Mood is normal and affect is normal.  Cranial nerves II - XII are as described above under HEENT exam. In addition: shoulder shrug is normal with equal shoulder height noted. Motor exam: Normal bulk, strength and tone is noted. There is no drift, tremor or rebound. Romberg is negative. Reflexes are 2+ throughout. Fine motor skills and coordination: intact with normal finger taps, normal hand movements, normal rapid alternating patting, normal foot taps and normal foot agility.  Cerebellar testing: No dysmetria or intention tremor.  Sensory exam: intact to light touch in the upper and lower extremities.  Gait, station and balance: She stands easily. No veering to one side is noted. No leaning to one side is noted. Posture is age-appropriate and stance is narrow based. Gait shows normal stride length and normal pace. No problems turning are noted. Tandem walk is challenging.    Assessment and Plan:  In summary, Sue Sanchez is a very pleasant 47 y.o.-year old female with an underlying medical history of MS, migraine headaches, B12 deficiency, COPD, reflux disease, osteoarthritis, vitamin D deficiency, and obesity, whose history and physical exam concerning for obstructive sleep apnea (OSA). I had a long chat with the patient about my findings and the diagnosis of OSA, its prognosis and treatment options. We talked about medical treatments, surgical interventions and non-pharmacological approaches. I explained in particular the risks and ramifications of untreated moderate to severe OSA, especially with respect to developing cardiovascular disease down the Road, including  congestive heart failure, difficult to treat hypertension, cardiac arrhythmias, or stroke. Even type 2 diabetes has, in part, been linked to untreated OSA. Symptoms of untreated OSA include daytime sleepiness, memory problems, mood irritability and mood disorder such as depression and anxiety, lack of energy, as well as recurrent headaches, especially morning headaches. We talked about smoking cessation and trying to maintain a healthy  lifestyle in general, as well as the importance of weight control. I encouraged the patient to eat healthy, exercise daily and keep well hydrated, to keep a scheduled bedtime and wake time routine, to not skip any meals and eat healthy snacks in between meals. I advised the patient not to drive when feeling sleepy.  I recommended the following at this time: sleep study with potential positive airway pressure titration. (We will score hypopneas at 4%).   I explained the sleep test procedure to the patient and also outlined possible surgical and non-surgical treatment options of OSA, including the use of a custom-made dental device (which would require a referral to a specialist dentist or oral surgeon), upper airway surgical options, such as pillar implants, radiofrequency surgery, tongue base surgery, and UPPP (which would involve a referral to an ENT surgeon). Rarely, jaw surgery such as mandibular advancement may be considered.  I also explained the CPAP treatment option to the patient, who indicated that she would be willing to try CPAP if the need arises. I explained the importance of being compliant with PAP treatment, not only for insurance purposes but primarily to improve Her symptoms, and for the patient's long term health benefit, including to reduce Her cardiovascular risks. I answered all her questions today and the patient was in agreement. I plan to see her back after the sleep study is completed and encouraged her to call with any interim questions, concerns,  problems or updates.   Thank you very much for allowing me to participate in the care of this nice patient. If I can be of any further assistance to you please do not hesitate to call me at [phone].   Sincerely,   Huston Foley, MD, PhD

## 2018-02-11 DIAGNOSIS — F4323 Adjustment disorder with mixed anxiety and depressed mood: Secondary | ICD-10-CM | POA: Diagnosis not present

## 2018-02-24 ENCOUNTER — Encounter: Payer: Self-pay | Admitting: *Deleted

## 2018-02-24 ENCOUNTER — Telehealth: Payer: Self-pay | Admitting: *Deleted

## 2018-02-24 ENCOUNTER — Ambulatory Visit (INDEPENDENT_AMBULATORY_CARE_PROVIDER_SITE_OTHER): Payer: Medicare Other | Admitting: Neurology

## 2018-02-24 DIAGNOSIS — G4761 Periodic limb movement disorder: Secondary | ICD-10-CM | POA: Diagnosis not present

## 2018-02-24 DIAGNOSIS — R0683 Snoring: Secondary | ICD-10-CM

## 2018-02-24 DIAGNOSIS — G4719 Other hypersomnia: Secondary | ICD-10-CM

## 2018-02-24 DIAGNOSIS — G35 Multiple sclerosis: Secondary | ICD-10-CM

## 2018-02-24 DIAGNOSIS — G4720 Circadian rhythm sleep disorder, unspecified type: Secondary | ICD-10-CM

## 2018-02-24 DIAGNOSIS — R519 Headache, unspecified: Secondary | ICD-10-CM

## 2018-02-24 DIAGNOSIS — R51 Headache: Secondary | ICD-10-CM

## 2018-02-24 DIAGNOSIS — E669 Obesity, unspecified: Secondary | ICD-10-CM

## 2018-02-24 NOTE — Telephone Encounter (Signed)
Received faxed notice from [facility] that they have not been able to reach Sue Sanchez to sched. next Gilenya delivery. I lmom (identified vm) and mailed an unable to contact letter to her home address,  requesting she contact [facility] at [phone]

## 2018-02-25 DIAGNOSIS — F4323 Adjustment disorder with mixed anxiety and depressed mood: Secondary | ICD-10-CM | POA: Diagnosis not present

## 2018-03-02 ENCOUNTER — Telehealth: Payer: Self-pay

## 2018-03-02 NOTE — Telephone Encounter (Signed)
-----   Message from Huston Foley, MD sent at 03/02/2018  8:01 AM EST ----- Patient referred by MM, seen by me on 02/10/18, diagnostic PSG on 02/24/18.   Please call and notify the patient that the recent sleep study did not show any significant obstructive sleep apnea, with the exception of snoring. Weight loss and avoidance of the supine sleep position are recommended. For disturbing snoring, an oral appliance (through a qualified dentist) can be considered. She had mild leg movements/twitching, which we call PLMs (periodic limb movements of sleep), but these were not very disruptive to her sleep (could be medication related). Overall, she slept okay, achieved all stages of sleep, but more light stage sleep and less REM sleep/dream sleep (could be med related from taking Lexapro).  Please remind patient to try to maintain good sleep hygiene, which means: Keep a regular sleep and wake schedule and make enough time for sleep (7 1/2 to 8 1/2 hours for the average adult), try not to exercise or have a meal within 2 hours of your bedtime, try to keep your bedroom conducive for sleep, that is, cool and dark, without light distractors such as an illuminated alarm clock, and refrain from watching TV right before sleep or in the middle of the night and do not keep the TV or radio on during the night. If a nightlight is used, have it away from the visual field. Also, try not to use or play on electronic devices at bedtime, such as your cell phone, tablet PC or laptop. If you like to read at bedtime on an electronic device, try to dim the background light as much as possible. Do not eat in the middle of the night. Keep pets away from the bedroom environment. For stress relief, try meditation, deep breathing exercises (there are many books and CDs available), a white noise machine or fan can help to diffuse other noise distractors, such as traffic noise. Do not drink alcohol before bedtime, as it can disturb sleep and cause  middle of the night awakenings. Never mix alcohol and sedating medications! Avoid narcotic pain medication close to bedtime, as opioids/narcotics can suppress breathing drive and breathing effort.   She can FU with MM and Dr. Anne Hahn.   Thanks,  Huston Foley, MD, PhD [facility])

## 2018-03-02 NOTE — Telephone Encounter (Signed)
I called pt to discuss her sleep study results. No answer, left a message asking her to call me back. 

## 2018-03-02 NOTE — Procedures (Signed)
PATIENT'S NAME:  Sue Sanchez, Sue Sanchez DOB:      02/24/2020      MR#:    [id number]     DATE OF RECORDING: 02/24/2018 REFERRING M.D.:  Butch Penny, NP Study Performed:   Baseline Polysomnogram HISTORY: 47 year old right-handed woman with an underlying medical history of MS, migraine headaches, B12 deficiency, COPD, reflux disease, osteoarthritis, vitamin D deficiency, and obesity, who reports snoring and excessive daytime somnolence. The patient endorsed the Epworth Sleepiness Scale at 16/24 points. The patient's weight 190 pounds with a height of 63 (inches), resulting in a BMI of 33.6 kg/m2. The patient's neck circumference measured 16 inches.  CURRENT MEDICATIONS: Symbicort, Cholecalciferol, Flexeril, Lexapro, Gilenya, Advil, Inderal, Proventil, Zantac,   PROCEDURE:  This is a multichannel digital polysomnogram utilizing the Somnostar 11.2 system.  Electrodes and sensors were applied and monitored per AASM Specifications.   EEG, EOG, Chin and Limb EMG, were sampled at 200 Hz.  ECG, Snore and Nasal Pressure, Thermal Airflow, Respiratory Effort, CPAP Flow and Pressure, Oximetry was sampled at 50 Hz. Digital video and audio were recorded.      BASELINE STUDY  Lights Out was at 23:17 and Lights On at 05:53.  Total recording time (TRT) was 396.5 minutes, with a total sleep time (TST) of 294.5 minutes. The patient's sleep latency was 60.5 minutes, which is delayed. REM latency was 340 minutes, which is markedly delayed. The sleep efficiency was 74.3%, which is reduced.     SLEEP ARCHITECTURE: WASO (Wake after sleep onset) was 59 minutes with mild to moderate sleep fragmentation noted. There were 23.5 minutes in Stage N1, 206 minutes Stage N2, 51.5 minutes Stage N3 and 13.5 minutes in Stage REM.  The percentage of Stage N1 was 8.%, Stage N2 was 69.9%, which is increased, Stage N3 was 17.5%, which is normal, and Stage R (REM sleep) was 4.6%, which is reduced. The arousals were noted as: 21 were  spontaneous, 27 were associated with PLMs, 2 were associated with respiratory events.  RESPIRATORY ANALYSIS:  There were a total of 10 respiratory events:  5 obstructive apneas, 5 central apneas and 0 mixed apneas with a total of 10 apneas and an apnea index (AI) of 2. /hour. There were 0 hypopneas with a hypopnea index of 0 /hour. The patient also had 0 respiratory event related arousals (RERAs).      The total APNEA/HYPOPNEA INDEX (AHI) was 2./hour and the total RESPIRATORY DISTURBANCE INDEX was 2. /hour.  0 events occurred in REM sleep and 5 events in NREM. The REM AHI was 0 /hour, versus a non-REM AHI of 2.1. The patient spent 104 minutes of total sleep time in the supine position and 191 minutes in non-supine.. The supine AHI was 1.7 versus a non-supine AHI of 2.2.  OXYGEN SATURATION & C02:  The Wake baseline 02 saturation was 92%, with the lowest being 88%. Time spent below 89% saturation equaled 0 minutes.  PERIODIC LIMB MOVEMENTS: The patient had a total of 111 Periodic Limb Movements.  The Periodic Limb Movement (PLM) index was 22.6 and the PLM Arousal index was 5.5/hour.  Audio and video analysis did not show any abnormal or unusual movements, behaviors, phonations or vocalizations. The patient took bathroom breaks. Snoring was noted, ranging from moderate to loud. The EKG was in keeping with normal sinus rhythm (NSR).  Post-study, the patient indicated that sleep was the same as usual.   IMPRESSION:  1. Primary Snoring  2. Periodic Limb Movement Disorder (PLMD) 3. Dysfunctions associated with sleep  stages or arousal from sleep  RECOMMENDATIONS:  1. This study does not demonstrate any significant obstructive or central sleep disordered breathing, with the exception of snoring. Weight loss and avoidance of the supine sleep position are recommended. For disturbing snoring, an oral appliance (through a qualified dentist) can be considered.  2. Mild PLMs (periodic limb movements of  sleep) were noted during this study with minimal arousals; clinical correlation is recommended.  3. This study shows sleep fragmentation and abnormal sleep stage percentages; these are nonspecific findings and per se do not signify an intrinsic sleep disorder or a cause for the patient's sleep-related symptoms. Causes include (but are not limited to) the first night effect of the sleep study, circadian rhythm disturbances, medication effect or an underlying mood disorder or medical problem.  4. The patient should be cautioned not to drive, work at heights, or operate dangerous or heavy equipment when tired or sleepy. Review and reiteration of good sleep hygiene measures should be pursued with any patient. 5. The patient will be advised to follow up with the referring provider, who will be notified of the test results.  I certify that I have reviewed the entire raw data recording prior to the issuance of this report in accordance with the Standards of Accreditation of the American Academy of Sleep Medicine (AASM)   Huston Foley, MD, PhD Diplomat, American Board of Neurology and Sleep Medicine (Neurology and Sleep Medicine)

## 2018-03-02 NOTE — Progress Notes (Signed)
Patient referred by MM, seen by me on 02/10/18, diagnostic PSG on 02/24/18.   Please call and notify the patient that the recent sleep study did not show any significant obstructive sleep apnea, with the exception of snoring. Weight loss and avoidance of the supine sleep position are recommended. For disturbing snoring, an oral appliance (through a qualified dentist) can be considered. She had mild leg movements/twitching, which we call PLMs (periodic limb movements of sleep), but these were not very disruptive to her sleep (could be medication related). Overall, she slept okay, achieved all stages of sleep, but more light stage sleep and less REM sleep/dream sleep (could be med related from taking Lexapro).  Please remind patient to try to maintain good sleep hygiene, which means: Keep a regular sleep and wake schedule and make enough time for sleep (7 1/2 to 8 1/2 hours for the average adult), try not to exercise or have a meal within 2 hours of your bedtime, try to keep your bedroom conducive for sleep, that is, cool and dark, without light distractors such as an illuminated alarm clock, and refrain from watching TV right before sleep or in the middle of the night and do not keep the TV or radio on during the night. If a nightlight is used, have it away from the visual field. Also, try not to use or play on electronic devices at bedtime, such as your cell phone, tablet PC or laptop. If you like to read at bedtime on an electronic device, try to dim the background light as much as possible. Do not eat in the middle of the night. Keep pets away from the bedroom environment. For stress relief, try meditation, deep breathing exercises (there are many books and CDs available), a white noise machine or fan can help to diffuse other noise distractors, such as traffic noise. Do not drink alcohol before bedtime, as it can disturb sleep and cause middle of the night awakenings. Never mix alcohol and sedating  medications! Avoid narcotic pain medication close to bedtime, as opioids/narcotics can suppress breathing drive and breathing effort.   She can FU with MM and Dr. Anne Hahn.   Thanks,  Huston Foley, MD, PhD [facility])

## 2018-03-03 NOTE — Telephone Encounter (Signed)
I called pt. I advised pt that Dr. Frances Furbish reviewed pt's sleep study and found that pt did not show any significant osa with the exception of snoring. If this snoring is disturbing to the pt, Dr. Frances Furbish recommends that pt consider an oral appliance to treat snoring. The oral appliance should be made by a qualified dentist. I advised pt that she had mild PLMs that were not ery disruptive to her sleep that could be caused by medications. I advised her that she achieved all stages of her sleep but had more light sleep and less REM sleep which could also be medication related. Dr. Frances Furbish recommends that pt follow up with Dr. Anne Hahn and Aundra Millet, NP. I reviewed sleep hygiene recommendations with the pt, including trying to keep a regular sleep wake schedule, avoiding electronics in the bedroom, keeping the bedroom cool, dark, and quiet, and avoiding eating or exercising within 2 hours of bedtime as well as eating in the middle of the night. I advised pt to keep pets out of the bedroom. I discussed with pt the importance of stress relief and to try meditation, deep breathing exercises, and/or a white noise machine or fan to diffuse other noise distractors. I advised pt to not drink alcohol before bedtime and to never mix alcohol and sedating medications. Pt was advised to avoid narcotic pain medication close to bedtime. Pt verbalized understanding of results. Pt had no questions at this time but was encouraged to call back if questions arise.

## 2018-03-11 DIAGNOSIS — F4323 Adjustment disorder with mixed anxiety and depressed mood: Secondary | ICD-10-CM | POA: Diagnosis not present

## 2018-03-25 DIAGNOSIS — F4323 Adjustment disorder with mixed anxiety and depressed mood: Secondary | ICD-10-CM | POA: Diagnosis not present

## 2018-04-22 DIAGNOSIS — F4323 Adjustment disorder with mixed anxiety and depressed mood: Secondary | ICD-10-CM | POA: Diagnosis not present

## 2018-05-29 ENCOUNTER — Other Ambulatory Visit: Payer: Self-pay

## 2018-05-29 ENCOUNTER — Encounter: Payer: Self-pay | Admitting: Neurology

## 2018-05-29 ENCOUNTER — Ambulatory Visit (INDEPENDENT_AMBULATORY_CARE_PROVIDER_SITE_OTHER): Payer: Medicare Other | Admitting: Neurology

## 2018-05-29 ENCOUNTER — Ambulatory Visit: Payer: Medicare Other | Admitting: Neurology

## 2018-05-29 VITALS — BP 154/89 | HR 69 | Resp 18 | Ht 63.0 in | Wt 188.0 lb

## 2018-05-29 DIAGNOSIS — G35 Multiple sclerosis: Secondary | ICD-10-CM | POA: Diagnosis not present

## 2018-05-29 DIAGNOSIS — R269 Unspecified abnormalities of gait and mobility: Secondary | ICD-10-CM | POA: Diagnosis not present

## 2018-05-29 DIAGNOSIS — Z5181 Encounter for therapeutic drug level monitoring: Secondary | ICD-10-CM | POA: Diagnosis not present

## 2018-05-29 DIAGNOSIS — E559 Vitamin D deficiency, unspecified: Secondary | ICD-10-CM

## 2018-05-29 DIAGNOSIS — R5382 Chronic fatigue, unspecified: Secondary | ICD-10-CM

## 2018-05-29 MED ORDER — AMPHETAMINE-DEXTROAMPHETAMINE 10 MG PO TABS: 10.0000 mg | ORAL_TABLET | Freq: Every day | ORAL | 0 refills | Status: DC

## 2018-05-29 NOTE — Progress Notes (Signed)
I have read the note, and I agree with the clinical assessment and plan.  Charles K Willis   

## 2018-05-29 NOTE — Progress Notes (Signed)
PATIENT: Sue Sanchez DOB: [birth year]  REASON FOR VISIT: follow up HISTORY FROM: patient  HISTORY OF PRESENT ILLNESS: Today 05/29/18  Sue Sanchez is a 48 year old female who presents for follow-up for multiple sclerosis. She is taking Gilenya 0.5 mg daily and is tolerating the medication well.  Has been taking Gilenya since 2015.  She had a sleep evaluation in November 2019 with Dr. Frances Furbish that did not show any significant obstructive sleep apnea with the exception of snoring. She had an eye exam by Dr. Dione Booze in September 2019 with no evidence of macular edema.  In the past she has had low vitamin D and she has been taking a supplement.  Her last MS flare may have been in April 2019 where she felt that she was having the MS hug, and left arm numbness.  The symptoms subsided.  In the past she has not tolerated steroids because they give her thrush.  She reports that overall she has been doing well since her last visit.  She does continue to complain of chronic fatigue and that around 2 or 3:00 every day in the afternoon she feels like she needs to take a nap.  She is caring for Her -36-year-old grandson Simonne Come all day.  She reports that her fatigue has become so significant that it is hard to care for him daily.  She denies any changes in her gait or any recent falls.  She denies any new symptoms of numbness or weakness.  She denies any episodes of urinary or bowel incontinence but she reports that she sometimes feels that she does not completely empty her bladder.  She reports continued neck pain that she takes Flexeril for.  She also complains of a chronic wound to the top of her right foot that she has had for 3 years and is nonhealing.  She denies any change in her vision.  She presents today for follow-up unaccompanied.   HISTORY Today 11/20/17 Sue Sanchez is a 48 year old female with a history of multiple sclerosis.  She returns today for follow-up.  She states that Flexeril has been  somewhat helpful for her muscle spasms.  She reports that she continues to have neck pain.  She states typically in the afternoons around 4-5 PM she will develop a headache in the occipital region.  Some days are worse than others.  She denies any new numbness or weakness.  She states over time she feels that her legs have gotten weaker.  Denies any changes with her gait or balance.  She does report that sometimes her legs feel heavy when ambulating.  Denies any falls.  No changes in her vision.  No changes in the bowels or bladder.  She does have incontinence at times when she sneezes or coughs.  She reports that she feels sleepy throughout the day.  She reports that she does snore.  In the past she has been referred for a sleep evaluation but at the time her insurance did not cover this.  She returns today for evaluation.  HISTORY 07/22/17 Sue Sanchez is a 48 year old female with a history of multiple. She returns today for follow-up. She states that starting last Wednesday she felt that she was having MS hug. She states that the squeezing sensation has subsided but now she is having pain in the center of the back that radiates to the right side and to the right shoulder. She reports that she also was experiencing numbness in the left arm but this has  subsided. She still has some numbness in the right arm but this is improving. She also states that she has noted urinary incontinence at night but this only happened Friday and Saturday night. She continues to have diarrhea. She has not followed up with her PCP regarding this. She denies any changes in her gait or balance. Denies any changes with her vision. She reports that she has been on oral steroids in the past but this gives her thrush. She prefers not to have steroids unless absolutely needed. She also reports diffuse joint pain. She continues on Gilenya. Reports that she has been on Flexeril in the past and it has been beneficial.  Returns today for evaluation.  REVIEW OF SYSTEMS: Out of a complete 14 system review of symptoms, the patient complains only of the following symptoms, and all other reviewed systems are negative.  Incontinence of bladder, joint pain, neck stiffness, neck pain  ALLERGIES: No Known Allergies  HOME MEDICATIONS: Outpatient Medications Prior to Visit  Medication Sig Dispense Refill  . budesonide-formoterol (SYMBICORT) 160-4.5 MCG/ACT inhaler Inhale 2 puffs into the lungs 2 (two) times daily.    . Cholecalciferol 1000 units capsule Take 1 capsule (1,000 Units total) by mouth daily. 30 capsule 11  . cyclobenzaprine (FLEXERIL) 5 MG tablet Take 2 tablets (10 mg total) by mouth at bedtime as needed for muscle spasms. 60 tablet 5  . escitalopram (LEXAPRO) 10 MG tablet TAKE 1 TABLET BY MOUTH EVERY DAY 90 tablet 3  . Fingolimod HCl (GILENYA) 0.5 MG CAPS Take 1 capsule (0.5 mg total) by mouth daily. 30 capsule 11  . ibuprofen (ADVIL,MOTRIN) 200 MG tablet Take 3 tablets (600 mg total) by mouth every 6 (six) hours as needed for headache or moderate pain. 30 tablet 0  . propranolol (INDERAL) 40 MG tablet TAKE 1 TABLET BY MOUTH TWICE A DAY 180 tablet 3  . PROVENTIL HFA 108 (90 BASE) MCG/ACT inhaler Inhale 2 puffs into the lungs every 6 (six) hours as needed for wheezing or shortness of breath.     . ranitidine (ZANTAC) 300 MG tablet Take 300 mg by mouth at bedtime as needed for heartburn.   11   No facility-administered medications prior to visit.     PAST MEDICAL HISTORY: Past Medical History:  Diagnosis Date  . Abnormality of gait 03/03/2015  . B12 deficiency 08/31/2014  . Chronic fatigue 08/31/2014  . Classic migraine 01/12/2016  . COPD (chronic obstructive pulmonary disease) (HCC)   . Gastroesophageal reflux disease   . Migraine   . MS (multiple sclerosis) (HCC)   . Numbness    bilateral lower extremity  . Obesity   . Osteoarthritis    diagnosed by orthopedics  . Vitamin D deficiency      PAST SURGICAL HISTORY: Past Surgical History:  Procedure Laterality Date  . CESAREAN SECTION    . COSMETIC SURGERY     lower right leg  . FOOT SURGERY     Club feet  . NASAL SINUS SURGERY     10/23/2016,,  dev septum, other  . TUBAL LIGATION Bilateral     FAMILY HISTORY: Family History  Problem Relation Age of Onset  . Diabetes Mother   . Bipolar disorder Sister   . Bipolar disorder Brother   . Cancer Maternal Grandmother   . COPD Paternal Grandfather   . Heart disease Paternal Grandfather     SOCIAL HISTORY: Social History   Socioeconomic History  . Marital status: Married    Spouse name: Not  on file  . Number of children: 2  . Years of education: college  . Highest education level: Not on file  Occupational History  . Occupation: Unemployed  Social Needs  . Financial resource strain: Not on file  . Food insecurity:    Worry: Not on file    Inability: Not on file  . Transportation needs:    Medical: Not on file    Non-medical: Not on file  Tobacco Use  . Smoking status: Current Every Day Smoker    Packs/day: 0.50    Years: 30.00    Pack years: 15.00    Types: Cigarettes  . Smokeless tobacco: Never Used  Substance and Sexual Activity  . Alcohol use: No    Alcohol/week: 0.0 standard drinks  . Drug use: No  . Sexual activity: Yes  Lifestyle  . Physical activity:    Days per week: Not on file    Minutes per session: Not on file  . Stress: Not on file  Relationships  . Social connections:    Talks on phone: Not on file    Gets together: Not on file    Attends religious service: Not on file    Active member of club or organization: Not on file    Attends meetings of clubs or organizations: Not on file    Relationship status: Not on file  . Intimate partner violence:    Fear of current or ex partner: Not on file    Emotionally abused: Not on file    Physically abused: Not on file    Forced sexual activity: Not on file  Other Topics Concern  .  Not on file  Social History Narrative   Patient is Divorced with 2 children.   Patient has a college education.   Patient is right handed.   Caffeine Intake: 6 or more sodas daily      Patient has a dog named Butters.      PHYSICAL EXAM  There were no vitals filed for this visit. There is no height or weight on file to calculate BMI.  Generalized: Well developed, in no acute distress   Neurological examination  Mentation: Alert oriented to time, place, history taking. Follows all commands speech and language fluent Cranial nerve II-XII: Pupils were equal round reactive to light. Extraocular movements were full, visual field were full on confrontational test. Facial sensation and strength were normal. Uvula tongue midline. Head turning and shoulder shrug  were normal and symmetric. Motor: The motor testing reveals 4 over 5 strength of all 4 extremities. Good symmetric motor tone is noted throughout.  Sensory: Sensory testing is intact to soft touch on all 4 extremities. No evidence of extinction is noted.  Nonhealing wound to the top of her right lateral foot.  No drainage noted but there is redness around the wound. Coordination: Cerebellar testing reveals good finger-nose-finger and heel-to-shin bilaterally.  Gait and station: Gait is normal. Tandem gait is mildly unsteady. Romberg is negative. No drift is seen.  Reflexes: Deep tendon reflexes are symmetric and normal bilaterally.   DIAGNOSTIC DATA (LABS, IMAGING, TESTING) - I reviewed patient records, labs, notes, testing and imaging myself where available.  Lab Results  Component Value Date   WBC 6.5 11/20/2017   HGB 15.0 11/20/2017   HCT 45.8 11/20/2017   MCV 94 11/20/2017   PLT 306 11/20/2017      Component Value Date/Time   NA 138 11/20/2017 0850   K 4.2 11/20/2017 0850  CL 102 11/20/2017 0850   CO2 24 11/20/2017 0850   GLUCOSE 100 (H) 11/20/2017 0850   BUN 12 11/20/2017 0850   CREATININE 0.92 11/20/2017 0850    CALCIUM 9.2 11/20/2017 0850   PROT 6.4 11/20/2017 0850   ALBUMIN 4.1 11/20/2017 0850   AST 18 11/20/2017 0850   ALT 20 11/20/2017 0850   ALKPHOS 124 (H) 11/20/2017 0850   BILITOT 0.4 11/20/2017 0850   GFRNONAA 73 11/20/2017 0850   GFRAA 85 11/20/2017 0850   No results found for: CHOL, HDL, LDLCALC, LDLDIRECT, TRIG, CHOLHDL No results found for: [id number] Lab Results  Component Value Date   VITAMINB12 409 08/31/2014   No results found for: TSH    ASSESSMENT AND PLAN [age] y.o. year old female  has a past medical history of Abnormality of gait (03/03/2015), B12 deficiency (08/31/2014), Chronic fatigue (08/31/2014), Classic migraine (01/12/2016), COPD (chronic obstructive pulmonary disease) (HCC), Gastroesophageal reflux disease, Migraine, MS (multiple sclerosis) (HCC), Numbness, Obesity, Osteoarthritis, and Vitamin D deficiency. here with:  1. Multiple Sclerosis   Overall she reports that she is doing well and she is tolerating the Gilenya quite well.  She does continue to complain of chronic fatigue that is starting to affect her ability to care for her grandson [name].  She does care for him every day while her daughter is working.  We will start Adderall 10 mg daily for a trial.  She will try this and let me know if she has good benefit.  She is not exhibiting any new MS symptoms.  We will continue her Gilenya 0.5 mg daily. She had MRI of the brain and MRI of the cervical spine in April 2019 that did not show any enhancing lesions. I will recheck labs today including a vitamin D level as her vitamin D was 18.5 in August 2019.  She continues to take a vitamin D supplement.  Her absolute lymphocyte count was 0.8 in August 2019.  I will check a B12 level as well because she reports that this is been low in the past.  She also has a nonhealing wound to the top of her right foot for 3 years and is requesting a referral to the wound center.  I will place the referral today.  She will follow-up in our office  in 6 months or sooner if needed.  I advised her to let me know how she is tolerating the Adderall.  I advised her that if her symptoms worsen or she should develop any new symptoms she should let us know.    I spent 15 minutes with the patient. 50% of this time was spent discussing her plan of care.   Margie EgeSarah Rito Lecomte, AGNP-C, DNP 05/29/2018, 10:06 AM [facility] [address], [address] [place], [place] [id number] [phone]

## 2018-05-30 LAB — CBC WITH DIFFERENTIAL/PLATELET
BASOS ABS: 0 10*3/uL (ref 0.0–0.2)
BASOS: 1 %
EOS (ABSOLUTE): 0.3 10*3/uL (ref 0.0–0.4)
Eos: 6 %
Hematocrit: 45.3 % (ref 34.0–46.6)
Hemoglobin: 15.1 g/dL (ref 11.1–15.9)
IMMATURE GRANULOCYTES: 0 %
Immature Grans (Abs): 0 10*3/uL (ref 0.0–0.1)
Lymphocytes Absolute: 0.3 10*3/uL — ABNORMAL LOW (ref 0.7–3.1)
Lymphs: 5 %
MCH: 30.1 pg (ref 26.6–33.0)
MCHC: 33.3 g/dL (ref 31.5–35.7)
MCV: 90 fL (ref 79–97)
MONOS ABS: 0.9 10*3/uL (ref 0.1–0.9)
Monocytes: 16 %
NEUTROS PCT: 72 %
Neutrophils Absolute: 4.1 10*3/uL (ref 1.4–7.0)
PLATELETS: 279 10*3/uL (ref 150–450)
RBC: 5.01 x10E6/uL (ref 3.77–5.28)
RDW: 12.6 % (ref 11.7–15.4)
WBC: 5.7 10*3/uL (ref 3.4–10.8)

## 2018-05-30 LAB — VITAMIN B12: Vitamin B-12: 176 pg/mL — ABNORMAL LOW (ref 232–1245)

## 2018-05-30 LAB — COMPREHENSIVE METABOLIC PANEL
ALT: 19 IU/L (ref 0–32)
AST: 15 IU/L (ref 0–40)
Albumin/Globulin Ratio: 1.9 (ref 1.2–2.2)
Albumin: 4.1 g/dL (ref 3.8–4.8)
Alkaline Phosphatase: 114 IU/L (ref 39–117)
BILIRUBIN TOTAL: 0.4 mg/dL (ref 0.0–1.2)
BUN/Creatinine Ratio: 13 (ref 9–23)
BUN: 11 mg/dL (ref 6–24)
CALCIUM: 9.2 mg/dL (ref 8.7–10.2)
CHLORIDE: 103 mmol/L (ref 96–106)
CO2: 22 mmol/L (ref 20–29)
Creatinine, Ser: 0.87 mg/dL (ref 0.57–1.00)
GFR calc Af Amer: 90 mL/min/{1.73_m2} (ref >59)
GFR, EST NON AFRICAN AMERICAN: 78 mL/min/{1.73_m2} (ref >59)
Globulin, Total: 2.2 g/dL (ref 1.5–4.5)
Glucose: 84 mg/dL (ref 65–99)
Potassium: 4 mmol/L (ref 3.5–5.2)
Sodium: 142 mmol/L (ref 134–144)
Total Protein: 6.3 g/dL (ref 6.0–8.5)

## 2018-05-30 LAB — VITAMIN D 25 HYDROXY (VIT D DEFICIENCY, FRACTURES): Vit D, 25-Hydroxy: 33.8 ng/mL (ref 30.0–100.0)

## 2018-06-01 ENCOUNTER — Telehealth: Payer: Self-pay | Admitting: Neurology

## 2018-06-01 NOTE — Telephone Encounter (Signed)
Yes, should be okay to take together. Could potentially increase BP when take together. On the phone, earlier she mentioned she had a cold. I'm guessing this is why she is asking to take the Aleve.

## 2018-06-01 NOTE — Telephone Encounter (Signed)
I called the patient. Her absolute lymphocyte count is 0.3. Her B12 is low 176. We will get her set up for a B12 injection in our office. I advised her to start taking an OTC 1,000 mcg B12 supplement. We will recheck her B12 at her next office visit. She reports she has been taking the adderall for 2 days and she has noticed a good improvement in her fatigue. Hopefully she will continue to feel better as her B12 improves. She verbalized understanding and was appreciative for the call.

## 2018-06-01 NOTE — Telephone Encounter (Signed)
Pt has called back to ask NP Sarah if it is okay to take Aleive with her amphetamine-dextroamphetamine (ADDERALL) 10 MG tablet please call

## 2018-06-02 ENCOUNTER — Telehealth: Payer: Self-pay

## 2018-06-02 ENCOUNTER — Ambulatory Visit (INDEPENDENT_AMBULATORY_CARE_PROVIDER_SITE_OTHER): Payer: Medicare Other

## 2018-06-02 DIAGNOSIS — E538 Deficiency of other specified B group vitamins: Secondary | ICD-10-CM | POA: Diagnosis not present

## 2018-06-02 MED ORDER — CYANOCOBALAMIN 1000 MCG/ML IJ SOLN
1000.0000 ug | Freq: Once | INTRAMUSCULAR | Status: AC
  Administered 2018-06-02: 1000 ug via INTRAMUSCULAR

## 2018-06-02 NOTE — Progress Notes (Signed)
Patient presented to the clinic to obtain vitamin B12 IM injection.  Patient's DOB was confirmed prior to injection being given and patient stated she had no questions. Left deltoid was cleansed with aseptic technique and injection was administered. Patient tolerated well and left with Band-Aid in place.

## 2018-06-02 NOTE — Telephone Encounter (Signed)
-----   Message from Sarah J Slack, NP sent at 06/01/2018  8:34 AM EST ----- Can we get this patient set up in to come in for a B12 injection. I'll call her and let her know her results. I will advise her that she'll need to come in for a one time B12 injection and that she should start taking a B12 supplement OTC now. Thanks! 

## 2018-06-02 NOTE — Telephone Encounter (Signed)
Left a voicemail for the patient to return my call. Will attempt to call back later today.

## 2018-06-02 NOTE — Progress Notes (Signed)
LVM 2/18 

## 2018-06-02 NOTE — Telephone Encounter (Signed)
Spoke with the patient and she has agreed to come in today at 2:30 for her B-12 injection. Patient is aware that she can take a OTC B-12 supplement. No other questions or concerns at this time.

## 2018-06-02 NOTE — Telephone Encounter (Signed)
-----   Message from Glean Salvo, NP sent at 06/01/2018  8:34 AM EST ----- Can we get this patient set up in to come in for a B12 injection. I'll call her and let her know her results. I will advise her that she'll need to come in for a one time B12 injection and that she should start taking a B12 supplement OTC now. Thanks!

## 2018-06-09 ENCOUNTER — Encounter (HOSPITAL_BASED_OUTPATIENT_CLINIC_OR_DEPARTMENT_OTHER): Payer: Medicare Other | Attending: Internal Medicine

## 2018-06-09 ENCOUNTER — Encounter (HOSPITAL_BASED_OUTPATIENT_CLINIC_OR_DEPARTMENT_OTHER): Payer: Self-pay

## 2018-06-09 DIAGNOSIS — E1151 Type 2 diabetes mellitus with diabetic peripheral angiopathy without gangrene: Secondary | ICD-10-CM | POA: Insufficient documentation

## 2018-06-09 DIAGNOSIS — I10 Essential (primary) hypertension: Secondary | ICD-10-CM | POA: Diagnosis not present

## 2018-06-09 DIAGNOSIS — S91301A Unspecified open wound, right foot, initial encounter: Secondary | ICD-10-CM | POA: Diagnosis not present

## 2018-06-09 DIAGNOSIS — G35 Multiple sclerosis: Secondary | ICD-10-CM | POA: Diagnosis not present

## 2018-06-09 DIAGNOSIS — M2060 Acquired deformities of toe(s), unspecified, unspecified foot: Secondary | ICD-10-CM | POA: Diagnosis not present

## 2018-06-09 DIAGNOSIS — L97512 Non-pressure chronic ulcer of other part of right foot with fat layer exposed: Secondary | ICD-10-CM | POA: Insufficient documentation

## 2018-06-09 DIAGNOSIS — E11621 Type 2 diabetes mellitus with foot ulcer: Secondary | ICD-10-CM | POA: Insufficient documentation

## 2018-06-16 ENCOUNTER — Encounter (HOSPITAL_BASED_OUTPATIENT_CLINIC_OR_DEPARTMENT_OTHER): Payer: Medicare Other | Attending: Internal Medicine

## 2018-06-29 ENCOUNTER — Other Ambulatory Visit: Payer: Self-pay | Admitting: Neurology

## 2018-06-29 NOTE — Telephone Encounter (Signed)
Pt called in for a refill request for amphetamine-dextroamphetamine (ADDERALL) 10 MG tablet to be sent to  [id number] [place], [place] - [address] [phone] (Phone) [phone] (Fax)

## 2018-06-30 MED ORDER — AMPHETAMINE-DEXTROAMPHETAMINE 10 MG PO TABS: 10.0000 mg | ORAL_TABLET | Freq: Every day | ORAL | 0 refills | Status: DC

## 2018-06-30 NOTE — Telephone Encounter (Signed)
A refill was sent in 30 tablets, no refills.

## 2018-06-30 NOTE — Telephone Encounter (Signed)
Pt returned Sarah's call. She is tolerating the medication well with no problems. It seems to be working well.

## 2018-06-30 NOTE — Telephone Encounter (Signed)
[place] Database Verified LR: 05-29-2018 Qty: 30 Pending appointment: 11-27-2018.

## 2018-06-30 NOTE — Telephone Encounter (Signed)
I called the patient to see how she was tolerating Adderall. I left a message. I will make sure beneficial before refilling.

## 2018-07-01 DIAGNOSIS — F4323 Adjustment disorder with mixed anxiety and depressed mood: Secondary | ICD-10-CM | POA: Diagnosis not present

## 2018-07-15 DIAGNOSIS — F4323 Adjustment disorder with mixed anxiety and depressed mood: Secondary | ICD-10-CM | POA: Diagnosis not present

## 2018-07-29 DIAGNOSIS — F4323 Adjustment disorder with mixed anxiety and depressed mood: Secondary | ICD-10-CM | POA: Diagnosis not present

## 2018-07-30 ENCOUNTER — Other Ambulatory Visit: Payer: Self-pay | Admitting: Neurology

## 2018-07-30 MED ORDER — AMPHETAMINE-DEXTROAMPHETAMINE 10 MG PO TABS: 10.0000 mg | ORAL_TABLET | Freq: Every day | ORAL | 0 refills | Status: DC

## 2018-07-30 NOTE — Telephone Encounter (Signed)
Pt is needing a refill on her amphetamine-dextroamphetamine (ADDERALL) 10 MG tablet [facility] [address]

## 2018-07-30 NOTE — Telephone Encounter (Signed)
[place] Database Verified LR: 06/30/2018 Qty: 30 Pending appointment: 11/27/2018

## 2018-08-03 ENCOUNTER — Other Ambulatory Visit: Payer: Self-pay | Admitting: Adult Health

## 2018-08-03 ENCOUNTER — Other Ambulatory Visit: Payer: Self-pay | Admitting: Neurology

## 2018-08-12 DIAGNOSIS — F4323 Adjustment disorder with mixed anxiety and depressed mood: Secondary | ICD-10-CM | POA: Diagnosis not present

## 2018-08-13 ENCOUNTER — Other Ambulatory Visit: Payer: Self-pay | Admitting: *Deleted

## 2018-08-13 MED ORDER — FINGOLIMOD HCL 0.5 MG PO CAPS: 0.5000 mg | ORAL_CAPSULE | Freq: Every day | ORAL | 11 refills | Status: DC

## 2018-08-28 ENCOUNTER — Other Ambulatory Visit: Payer: Self-pay | Admitting: Neurology

## 2018-08-28 NOTE — Telephone Encounter (Signed)
Pt is needing a refill on her amphetamine-dextroamphetamine (ADDERALL) 10 MG tablet [facility] [address] °

## 2018-08-31 MED ORDER — AMPHETAMINE-DEXTROAMPHETAMINE 10 MG PO TABS: 10.0000 mg | ORAL_TABLET | Freq: Every day | ORAL | 0 refills | Status: DC

## 2018-08-31 NOTE — Telephone Encounter (Signed)
[place] Database Verified LR: 07/30/2018  Qty: 30 Pending appointment: 11/27/2018

## 2018-09-09 DIAGNOSIS — F4323 Adjustment disorder with mixed anxiety and depressed mood: Secondary | ICD-10-CM | POA: Diagnosis not present

## 2018-09-23 DIAGNOSIS — F4323 Adjustment disorder with mixed anxiety and depressed mood: Secondary | ICD-10-CM | POA: Diagnosis not present

## 2018-09-25 ENCOUNTER — Telehealth: Payer: Self-pay | Admitting: Neurology

## 2018-09-25 NOTE — Telephone Encounter (Signed)
Pt is needing a refill on her amphetamine-dextroamphetamine (ADDERALL) 10 MG tablet [facility] [address]

## 2018-09-28 MED ORDER — AMPHETAMINE-DEXTROAMPHETAMINE 10 MG PO TABS: 10.0000 mg | ORAL_TABLET | Freq: Every day | ORAL | 0 refills | Status: DC

## 2018-09-28 NOTE — Telephone Encounter (Signed)
[place] drug registry verified. Last refill for Adderall was 08/31/18 # 30 for 30 day supply provided by Maryjean Ka, NP.

## 2018-09-28 NOTE — Telephone Encounter (Signed)
Yes, I have refilledi t

## 2018-10-07 DIAGNOSIS — F4323 Adjustment disorder with mixed anxiety and depressed mood: Secondary | ICD-10-CM | POA: Diagnosis not present

## 2018-10-14 ENCOUNTER — Other Ambulatory Visit: Payer: Self-pay | Admitting: Adult Health

## 2018-10-22 DIAGNOSIS — J449 Chronic obstructive pulmonary disease, unspecified: Secondary | ICD-10-CM | POA: Diagnosis not present

## 2018-10-22 DIAGNOSIS — F329 Major depressive disorder, single episode, unspecified: Secondary | ICD-10-CM | POA: Diagnosis not present

## 2018-10-22 DIAGNOSIS — K219 Gastro-esophageal reflux disease without esophagitis: Secondary | ICD-10-CM | POA: Diagnosis not present

## 2018-10-22 DIAGNOSIS — G35 Multiple sclerosis: Secondary | ICD-10-CM | POA: Diagnosis not present

## 2018-10-22 DIAGNOSIS — Z87891 Personal history of nicotine dependence: Secondary | ICD-10-CM | POA: Diagnosis not present

## 2018-10-28 ENCOUNTER — Other Ambulatory Visit: Payer: Self-pay | Admitting: Neurology

## 2018-10-28 MED ORDER — AMPHETAMINE-DEXTROAMPHETAMINE 10 MG PO TABS: 10.0000 mg | ORAL_TABLET | Freq: Every day | ORAL | 0 refills | Status: DC

## 2018-10-28 NOTE — Telephone Encounter (Signed)
Pt is requesting a refill of amphetamine-dextroamphetamine (ADDERALL) 10 MG tablet , to be sent to [id number] - [employer],  - [place].

## 2018-10-28 NOTE — Telephone Encounter (Signed)
Drug registry checked Adderall 10mg  po at breakfast #30.  Last fill 09-28-18.

## 2018-11-04 DIAGNOSIS — F4323 Adjustment disorder with mixed anxiety and depressed mood: Secondary | ICD-10-CM | POA: Diagnosis not present

## 2018-11-18 DIAGNOSIS — F4323 Adjustment disorder with mixed anxiety and depressed mood: Secondary | ICD-10-CM | POA: Diagnosis not present

## 2018-11-24 ENCOUNTER — Other Ambulatory Visit: Payer: Self-pay | Admitting: Neurology

## 2018-11-24 MED ORDER — AMPHETAMINE-DEXTROAMPHETAMINE 10 MG PO TABS: 10.0000 mg | ORAL_TABLET | Freq: Every day | ORAL | 0 refills | Status: DC

## 2018-11-24 NOTE — Telephone Encounter (Signed)
Pt is requesting a refill of amphetamine-dextroamphetamine (ADDERALL) 10 MG tablet, to be [facility] to [id number] - [place], [place] - [address] °

## 2018-11-24 NOTE — Telephone Encounter (Signed)
Drug registry checked adderall 10mg  Last filled 10-28-18 #30.

## 2018-11-27 ENCOUNTER — Ambulatory Visit: Payer: Medicare Other | Admitting: Neurology

## 2018-12-02 DIAGNOSIS — F4323 Adjustment disorder with mixed anxiety and depressed mood: Secondary | ICD-10-CM | POA: Diagnosis not present

## 2018-12-08 NOTE — Progress Notes (Deleted)
PATIENT: Sue Sanchez DOB: 07/26/1970  REASON FOR VISIT: follow up HISTORY FROM: patient  HISTORY OF PRESENT ILLNESS: Today 12/08/18  Sue Sanchez is a 48 year old female with history of multiple sclerosis.  She remains on Gilenya and tolerating medication well.  She has been on Gilenya since 2015.  Prior sleep evaluation has not shown any significant OSA.  Her last MS flare occurred in April 2019, where she felt she was having the MS hug, and left arm numbness.  In the past she has not tolerated steroids because they give her thrush.  She has complained of chronic fatigue that has improved while taking Adderall.  She remains on vitamin D supplementation after a low vitamin D level.  She last had MRI of the brain and cervical spine in April 2019 but not showing enhancing lesions.  She was found to have a low vitamin B12 level, was started on injection, she has continued oral supplementation.  HISTORY 05/29/2018 [name] is a 48 year old female who presents for follow-up for multiple sclerosis. She is taking Gilenya 0.5 mg daily and is tolerating the medication well.  Has been taking Gilenya since 2015.  She had a sleep evaluation in November 2019 with Dr. Frances FurbishAthar that did not show any significant obstructive sleep apnea with the exception of snoring. She had an eye exam by Dr. Dione BoozeGroat in September 2019 with no evidence of macular edema.  In the past she has had low vitamin D and she has been taking a supplement.  Her last MS flare may have been in April 2019 where she felt that she was having the MS hug, and left arm numbness.  The symptoms subsided.  In the past she has not tolerated steroids because they give her thrush.  She reports that overall she has been doing well since her last visit.  She does continue to complain of chronic fatigue and that around 2 or 3:00 every day in the afternoon she feels like she needs to take a nap.  She is caring for Her -50-year-old grandson Sue Sanchez all  day.  She reports that her fatigue has become so significant that it is hard to care for him daily.  She denies any changes in her gait or any recent falls.  She denies any new symptoms of numbness or weakness.  She denies any episodes of urinary or bowel incontinence but she reports that she sometimes feels that she does not completely empty her bladder.  She reports continued neck pain that she takes Flexeril for.  She also complains of a chronic wound to the top of her right foot that she has had for 3 years and is nonhealing.  She denies any change in her vision.  She presents today for follow-up unaccompanied.  REVIEW OF SYSTEMS: Out of a complete 14 system review of symptoms, the patient complains only of the following symptoms, and all other reviewed systems are negative.  ALLERGIES: No Known Allergies  HOME MEDICATIONS: Outpatient Medications Prior to Visit  Medication Sig Dispense Refill   amphetamine-dextroamphetamine (ADDERALL) 10 MG tablet Take 1 tablet (10 mg total) by mouth daily with breakfast. 30 tablet 0   budesonide-formoterol (SYMBICORT) 160-4.5 MCG/ACT inhaler Inhale 2 puffs into the lungs 2 (two) times daily.     [facility] D3 25 MCG (1000 UT) capsule TAKE 1 CAPSULE BY MOUTH DAILY. 90 capsule 3   cyclobenzaprine (FLEXERIL) 5 MG tablet TAKE 2 TABLETS (10 MG TOTAL) BY MOUTH AT BEDTIME AS NEEDED FOR MUSCLE  SPASMS. 60 tablet 5   escitalopram (LEXAPRO) 10 MG tablet TAKE 1 TABLET BY MOUTH EVERY DAY 90 tablet 3   Fingolimod HCl (GILENYA) 0.5 MG CAPS Take 1 capsule (0.5 mg total) by mouth daily. 30 capsule 11   ibuprofen (ADVIL,MOTRIN) 200 MG tablet Take 3 tablets (600 mg total) by mouth every 6 (six) hours as needed for headache or moderate pain. 30 tablet 0   propranolol (INDERAL) 40 MG tablet TAKE 1 TABLET BY MOUTH TWICE A DAY 180 tablet 1   PROVENTIL HFA 108 (90 BASE) MCG/ACT inhaler Inhale 2 puffs into the lungs every 6 (six) hours as needed for wheezing or shortness of breath.       ranitidine (ZANTAC) 300 MG tablet Take 300 mg by mouth at bedtime as needed for heartburn.   11   No facility-administered medications prior to visit.     PAST MEDICAL HISTORY: Past Medical History:  Diagnosis Date   Abnormality of gait 03/03/2015   B12 deficiency 08/31/2014   Chronic fatigue 08/31/2014   Classic migraine 01/12/2016   COPD (chronic obstructive pulmonary disease) (HCC)    Gastroesophageal reflux disease    Migraine    MS (multiple sclerosis) (HCC)    Numbness    bilateral lower extremity   Obesity    Osteoarthritis    diagnosed by orthopedics   Vitamin D deficiency     PAST SURGICAL HISTORY: Past Surgical History:  Procedure Laterality Date   CESAREAN SECTION     COSMETIC SURGERY     lower right leg   FOOT SURGERY     Club feet   NASAL SINUS SURGERY     10/23/2016,,  dev septum, other   TUBAL LIGATION Bilateral     FAMILY HISTORY: Family History  Problem Relation Age of Onset   Diabetes Mother    Bipolar disorder Sister    Bipolar disorder Brother    Cancer Maternal Grandmother    COPD Paternal Grandfather    Heart disease Paternal Grandfather     SOCIAL HISTORY: Social History   Socioeconomic History   Marital status: Married    Spouse name: Not on file   Number of children: 2   Years of education: college   Highest education level: Not on file  Occupational History   Occupation: Unemployed  Scientist, product/process development strain: Not on file   Food insecurity    Worry: Not on file    Inability: Not on file   Transportation needs    Medical: Not on file    Non-medical: Not on file  Tobacco Use   Smoking status: Current Every Day Smoker    Packs/day: 0.50    Years: 30.00    Pack years: 15.00    Types: Cigarettes   Smokeless tobacco: Never Used  Substance and Sexual Activity   Alcohol use: No    Alcohol/week: 0.0 standard drinks   Drug use: No   Sexual activity: Yes  Lifestyle    Physical activity    Days per week: Not on file    Minutes per session: Not on file   Stress: Not on file  Relationships   Social connections    Talks on phone: Not on file    Gets together: Not on file    Attends religious service: Not on file    Active member of club or organization: Not on file    Attends meetings of clubs or organizations: Not on file    Relationship status:  Not on file   Intimate partner violence    Fear of current or ex partner: Not on file    Emotionally abused: Not on file    Physically abused: Not on file    Forced sexual activity: Not on file  Other Topics Concern   Not on file  Social History Narrative   Patient is Divorced with 2 children.   Patient has a college education.   Patient is right handed.   Caffeine Intake: 6 or more sodas daily      Patient has a dog named Butters.      PHYSICAL EXAM  There were no vitals filed for this visit. There is no height or weight on file to calculate BMI.  Generalized: Well developed, in no acute distress   Neurological examination  Mentation: Alert oriented to time, place, history taking. Follows all commands speech and language fluent Cranial nerve II-XII: Pupils were equal round reactive to light. Extraocular movements were full, visual field were full on confrontational test. Facial sensation and strength were normal. Uvula tongue midline. Head turning and shoulder shrug  were normal and symmetric. Motor: The motor testing reveals 5 over 5 strength of all 4 extremities. Good symmetric motor tone is noted throughout.  Sensory: Sensory testing is intact to soft touch on all 4 extremities. No evidence of extinction is noted.  Coordination: Cerebellar testing reveals good finger-nose-finger and heel-to-shin bilaterally.  Gait and station: Gait is normal. Tandem gait is normal. Romberg is negative. No drift is seen.  Reflexes: Deep tendon reflexes are symmetric and normal bilaterally.   DIAGNOSTIC  DATA (LABS, IMAGING, TESTING) - I reviewed patient records, labs, notes, testing and imaging myself where available.  Lab Results  Component Value Date   WBC 5.7 05/29/2018   HGB 15.1 05/29/2018   HCT 45.3 05/29/2018   MCV 90 05/29/2018   PLT 279 05/29/2018      Component Value Date/Time   NA 142 05/29/2018 1056   K 4.0 05/29/2018 1056   CL 103 05/29/2018 1056   CO2 22 05/29/2018 1056   GLUCOSE 84 05/29/2018 1056   BUN 11 05/29/2018 1056   CREATININE 0.87 05/29/2018 1056   CALCIUM 9.2 05/29/2018 1056   PROT 6.3 05/29/2018 1056   ALBUMIN 4.1 05/29/2018 1056   AST 15 05/29/2018 1056   ALT 19 05/29/2018 1056   ALKPHOS 114 05/29/2018 1056   BILITOT 0.4 05/29/2018 1056   GFRNONAA 78 05/29/2018 1056   GFRAA 90 05/29/2018 1056   No results found for: CHOL, HDL, LDLCALC, LDLDIRECT, TRIG, CHOLHDL No results found for: [id number] Lab Results  Component Value Date   VITAMINB12 176 (L) 05/29/2018   No results found for: TSH    ASSESSMENT AND PLAN 48 y.o. year old female  has a past medical history of Abnormality of gait (03/03/2015), B12 deficiency (08/31/2014), Chronic fatigue (08/31/2014), Classic migraine (01/12/2016), COPD (chronic obstructive pulmonary disease) (HCC), Gastroesophageal reflux disease, Migraine, MS (multiple sclerosis) (HCC), Numbness, Obesity, Osteoarthritis, and Vitamin D deficiency. here with ***   I spent 15 minutes with the patient. 50% of this time was spent   Margie EgeSarah Adi Seales, PittsAGNP-C, DNP 12/08/2018, 8:01 PM [facility] [address], [address] [place], [place] [id number] [phone]

## 2018-12-09 ENCOUNTER — Ambulatory Visit: Payer: Medicare Other | Admitting: Neurology

## 2018-12-16 DIAGNOSIS — F4323 Adjustment disorder with mixed anxiety and depressed mood: Secondary | ICD-10-CM | POA: Diagnosis not present

## 2018-12-23 ENCOUNTER — Other Ambulatory Visit: Payer: Self-pay | Admitting: Neurology

## 2018-12-28 ENCOUNTER — Telehealth: Payer: Self-pay

## 2018-12-28 MED ORDER — AMPHETAMINE-DEXTROAMPHETAMINE 10 MG PO TABS: 10.0000 mg | ORAL_TABLET | Freq: Every day | ORAL | 0 refills | Status: DC

## 2018-12-28 NOTE — Telephone Encounter (Signed)
Patient call requesting her adderall refill.

## 2018-12-28 NOTE — Telephone Encounter (Signed)
Drug Registry checked last fill adderall 10mg   #30 11-25-18.

## 2018-12-28 NOTE — Telephone Encounter (Signed)
The adderall will be refilled.  She has revisit scheduled 01/13/2019. Rx sent.

## 2019-01-07 ENCOUNTER — Telehealth: Payer: Self-pay | Admitting: Neurology

## 2019-01-07 DIAGNOSIS — G35 Multiple sclerosis: Secondary | ICD-10-CM | POA: Diagnosis not present

## 2019-01-07 DIAGNOSIS — H2513 Age-related nuclear cataract, bilateral: Secondary | ICD-10-CM | POA: Diagnosis not present

## 2019-01-07 DIAGNOSIS — Z79899 Other long term (current) drug therapy: Secondary | ICD-10-CM | POA: Diagnosis not present

## 2019-01-07 NOTE — Telephone Encounter (Signed)
Pt called stating she was returning your call. Please call back when available.

## 2019-01-07 NOTE — Telephone Encounter (Signed)
The patient was seen today, January 07, 2019 by Dr. Nathanial Rancher.  No abnormalities noted, patient on Gilenya.

## 2019-01-12 NOTE — Progress Notes (Signed)
PATIENT: Sue Sanchez DOB: 10/06/1970  REASON FOR VISIT: follow up HISTORY FROM: patient  HISTORY OF PRESENT ILLNESS: Today 01/13/19  [name] is a 48 year old female with history of multiple sclerosis.  She remains on Gilenya and is tolerating medication well.  She continues on Adderall for fatigue.  She reports this has improved, and her mind feels more focused, but she is also tired because she thinks she may be going through menopause.  She reports she will occasionally have an intermittent numbness feeling to various sites of her body, but it is not persistent.  She says she may have paresthesia of crawling sensation to various areas.  She will apply itching medication and this seems to make the sensation subside.  She reports overtime she feels that her legs have gotten weaker, but she has underlying orthopedic issues.  Her headaches are under good control while taking propanolol.  She will take Flexeril as needed for neck pain and muscle spasms.  She will occasionally have urinary incontinence.  She has not had any recent falls.  She did go to the wound care center, the wound to the top of the right foot has healed.  She remains on oral vitamin D and vitamin B12 supplementation.  She saw Dr. Dione BoozeGroat 01/07/2019 for eye evaluation, no abnormalities were noted while on Gilenya.  She presents today for follow-up unaccompanied.  Her last MRI of the brain was in April 2019, did not show any significant changes from prior.  She reports in the next month she will be switching insurances.  HISTORY 05/29/2018 SS: Ms. Sue Sanchez is a 48 year old female who presents for follow-up for multiple sclerosis. She is taking Gilenya 0.5 mg daily and is tolerating the medication well.  Has been taking Gilenya since 2015.  She had a sleep evaluation in November 2019 with Dr. Frances FurbishAthar that did not show any significant obstructive sleep apnea with the exception of snoring. She had an eye exam by Dr. Dione BoozeGroat in  September 2019 with no evidence of macular edema.  In the past she has had low vitamin D and she has been taking a supplement.  Her last MS flare may have been in April 2019 where she felt that she was having the MS hug, and left arm numbness.  The symptoms subsided.  In the past she has not tolerated steroids because they give her thrush.  She reports that overall she has been doing well since her last visit.  She does continue to complain of chronic fatigue and that around 2 or 3:00 every day in the afternoon she feels like she needs to take a nap.  She is caring for Her -50-year-old grandson Sue Sanchez all day.  She reports that her fatigue has become so significant that it is hard to care for him daily.  She denies any changes in her gait or any recent falls.  She denies any new symptoms of numbness or weakness.  She denies any episodes of urinary or bowel incontinence but she reports that she sometimes feels that she does not completely empty her bladder.  She reports continued neck pain that she takes Flexeril for.  She also complains of a chronic wound to the top of her right foot that she has had for 3 years and is nonhealing.  She denies any change in her vision.  She presents today for follow-up unaccompanied.  REVIEW OF SYSTEMS: Out of a complete 14 system review of symptoms, the patient complains only of the following  symptoms, and all other reviewed systems are negative.  Numbness, urinary incontinence, headache, depression, fatigue  ALLERGIES: No Known Allergies  HOME MEDICATIONS: Outpatient Medications Prior to Visit  Medication Sig Dispense Refill  . amphetamine-dextroamphetamine (ADDERALL) 10 MG tablet Take 1 tablet (10 mg total) by mouth daily with breakfast. 30 tablet 0  . budesonide-formoterol (SYMBICORT) 160-4.5 MCG/ACT inhaler Inhale 2 puffs into the lungs 2 (two) times daily.    . [facility] D3 25 MCG (1000 UT) capsule TAKE 1 CAPSULE BY MOUTH DAILY. 90 capsule 3  . Cyanocobalamin (B-12 PO)  Take 1 tablet by mouth daily.     . cyclobenzaprine (FLEXERIL) 5 MG tablet TAKE 2 TABLETS (10 MG TOTAL) BY MOUTH AT BEDTIME AS NEEDED FOR MUSCLE SPASMS. 60 tablet 5  . escitalopram (LEXAPRO) 10 MG tablet TAKE 1 TABLET BY MOUTH EVERY DAY 90 tablet 3  . Esomeprazole Magnesium (NEXIUM PO) Take 1 tablet by mouth 2 (two) times daily.    . Fingolimod HCl (GILENYA) 0.5 MG CAPS Take 1 capsule (0.5 mg total) by mouth daily. 30 capsule 11  . ibuprofen (ADVIL,MOTRIN) 200 MG tablet Take 3 tablets (600 mg total) by mouth every 6 (six) hours as needed for headache or moderate pain. 30 tablet 0  . propranolol (INDERAL) 40 MG tablet TAKE 1 TABLET BY MOUTH TWICE A DAY 180 tablet 1  . PROVENTIL HFA 108 (90 BASE) MCG/ACT inhaler Inhale 2 puffs into the lungs every 6 (six) hours as needed for wheezing or shortness of breath.     . ranitidine (ZANTAC) 300 MG tablet Take 300 mg by mouth at bedtime as needed for heartburn.   11   No facility-administered medications prior to visit.     PAST MEDICAL HISTORY: Past Medical History:  Diagnosis Date  . Abnormality of gait 03/03/2015  . B12 deficiency 08/31/2014  . Chronic fatigue 08/31/2014  . Classic migraine 01/12/2016  . COPD (chronic obstructive pulmonary disease) (HCC)   . Gastroesophageal reflux disease   . Migraine   . MS (multiple sclerosis) (HCC)   . Numbness    bilateral lower extremity  . Obesity   . Osteoarthritis    diagnosed by orthopedics  . Vitamin D deficiency     PAST SURGICAL HISTORY: Past Surgical History:  Procedure Laterality Date  . CESAREAN SECTION    . COSMETIC SURGERY     lower right leg  . FOOT SURGERY     Club feet  . NASAL SINUS SURGERY     10/23/2016,,  dev septum, other  . TUBAL LIGATION Bilateral     FAMILY HISTORY: Family History  Problem Relation Age of Onset  . Diabetes Mother   . Bipolar disorder Sister   . Bipolar disorder Brother   . Cancer Maternal Grandmother   . COPD Paternal Grandfather   . Heart disease  Paternal Grandfather     SOCIAL HISTORY: Social History   Socioeconomic History  . Marital status: Married    Spouse name: Not on file  . Number of children: 2  . Years of education: college  . Highest education level: Not on file  Occupational History  . Occupation: Unemployed  Social Needs  . Financial resource strain: Not on file  . Food insecurity    Worry: Not on file    Inability: Not on file  . Transportation needs    Medical: Not on file    Non-medical: Not on file  Tobacco Use  . Smoking status: Current Every Day Smoker  Packs/day: 0.50    Years: 30.00    Pack years: 15.00    Types: Cigarettes  . Smokeless tobacco: Never Used  Substance and Sexual Activity  . Alcohol use: No    Alcohol/week: 0.0 standard drinks  . Drug use: No  . Sexual activity: Yes  Lifestyle  . Physical activity    Days per week: Not on file    Minutes per session: Not on file  . Stress: Not on file  Relationships  . Social Herbalist on phone: Not on file    Gets together: Not on file    Attends religious service: Not on file    Active member of club or organization: Not on file    Attends meetings of clubs or organizations: Not on file    Relationship status: Not on file  . Intimate partner violence    Fear of current or ex partner: Not on file    Emotionally abused: Not on file    Physically abused: Not on file    Forced sexual activity: Not on file  Other Topics Concern  . Not on file  Social History Narrative   Patient is Divorced with 2 children.   Patient has a college education.   Patient is right handed.   Caffeine Intake: 6 or more sodas daily      Patient has a dog named Butters.      PHYSICAL EXAM  Vitals:   01/13/19 1013  BP: 129/88  Pulse: 85  Temp: (!) 97.5 F (36.4 C)  TempSrc: Oral  Weight: 191 lb 3.2 oz (86.7 kg)  Height: 5\' 3"  (1.6 m)   Body mass index is 33.87 kg/m.  Generalized: Well developed, in no acute distress    Neurological examination  Mentation: Alert oriented to time, place, history taking. Follows all commands speech and language fluent Cranial nerve II-XII: Pupils were equal round reactive to light. Extraocular movements were full, visual field were full on confrontational test. Facial sensation and strength were normal.  Head turning and shoulder shrug  were normal and symmetric. Motor: The motor testing reveals 5 over 5 strength of all 4 extremities. Good symmetric motor tone is noted throughout.  Sensory: Sensory testing is intact to soft touch on all 4 extremities. No evidence of extinction is noted.  Coordination: Cerebellar testing reveals good finger-nose-finger and heel-to-shin bilaterally.  Gait and station: Gait is normal. Tandem gait is normal. Romberg is negative. No drift is seen.  Reflexes: Deep tendon reflexes are symmetric and normal bilaterally.   DIAGNOSTIC DATA (LABS, IMAGING, TESTING) - I reviewed patient records, labs, notes, testing and imaging myself where available.  Lab Results  Component Value Date   WBC 5.7 05/29/2018   HGB 15.1 05/29/2018   HCT 45.3 05/29/2018   MCV 90 05/29/2018   PLT 279 05/29/2018      Component Value Date/Time   NA 142 05/29/2018 1056   K 4.0 05/29/2018 1056   CL 103 05/29/2018 1056   CO2 22 05/29/2018 1056   GLUCOSE 84 05/29/2018 1056   BUN 11 05/29/2018 1056   CREATININE 0.87 05/29/2018 1056   CALCIUM 9.2 05/29/2018 1056   PROT 6.3 05/29/2018 1056   ALBUMIN 4.1 05/29/2018 1056   AST 15 05/29/2018 1056   ALT 19 05/29/2018 1056   ALKPHOS 114 05/29/2018 1056   BILITOT 0.4 05/29/2018 1056   GFRNONAA 78 05/29/2018 1056   GFRAA 90 05/29/2018 1056   No results found for: CHOL, HDL,  LDLCALC, LDLDIRECT, TRIG, CHOLHDL No results found for: IEPP2R Lab Results  Component Value Date   VITAMINB12 176 (L) 05/29/2018   No results found for: TSH    ASSESSMENT AND PLAN 48 y.o. year old female  has a past medical history of Abnormality  of gait (03/03/2015), B12 deficiency (08/31/2014), Chronic fatigue (08/31/2014), Classic migraine (01/12/2016), COPD (chronic obstructive pulmonary disease) (HCC), Gastroesophageal reflux disease, Migraine, MS (multiple sclerosis) (HCC), Numbness, Obesity, Osteoarthritis, and Vitamin D deficiency. here with:  1.  Multiple sclerosis 2.  Chronic fatigue 3.  Migraine  She has continued to do well while taking Gilenya.  She had recent eye exam by Dr. Dione Booze that did not show any abnormalities.  I will check routine lab work today while on Gilenya.  She remains on oral vitamin D and vitamin B12 supplementation.  Her vitamin B12 level was low at last visit, I will recheck this today.  She will continue taking Adderall as this is been beneficial for her fatigue.  She is also prescribed Lexapro, Flexeril, and propanolol from this office.  We will check MRI of the brain at next visit, due to her switching insurances in the near future (last MRI of the brain in April 2019, unchanged from prior) .  She will follow-up in 6 months or sooner if needed.  I did advise her symptoms worsen or she develops any new symptoms she should let us know.   I spent 25 minutes with the patient. 50% of this time was spent discussing her plan of care.   Margie Ege, AGNP-C, DNP 01/13/2019, 10:43 AM [facility] [address], [address] [place], [place] [address] [phone]

## 2019-01-13 ENCOUNTER — Encounter: Payer: Self-pay | Admitting: Neurology

## 2019-01-13 ENCOUNTER — Other Ambulatory Visit: Payer: Self-pay

## 2019-01-13 ENCOUNTER — Ambulatory Visit (INDEPENDENT_AMBULATORY_CARE_PROVIDER_SITE_OTHER): Payer: Medicare Other | Admitting: Neurology

## 2019-01-13 VITALS — BP 129/88 | HR 85 | Temp 97.5°F | Ht 63.0 in | Wt 191.2 lb

## 2019-01-13 DIAGNOSIS — F4323 Adjustment disorder with mixed anxiety and depressed mood: Secondary | ICD-10-CM | POA: Diagnosis not present

## 2019-01-13 DIAGNOSIS — E538 Deficiency of other specified B group vitamins: Secondary | ICD-10-CM | POA: Diagnosis not present

## 2019-01-13 DIAGNOSIS — G35 Multiple sclerosis: Secondary | ICD-10-CM

## 2019-01-13 NOTE — Patient Instructions (Signed)
1. Continue current medications, no changes  2. Check lab work today  3. Continue vitamin d, b 12 4. Check mri next visit  5. Return 6 months

## 2019-01-13 NOTE — Progress Notes (Signed)
I have read the note, and I agree with the clinical assessment and plan.  Charles K Willis   

## 2019-01-14 ENCOUNTER — Telehealth: Payer: Self-pay | Admitting: *Deleted

## 2019-01-14 LAB — COMPREHENSIVE METABOLIC PANEL
ALT: 34 IU/L — ABNORMAL HIGH (ref 0–32)
AST: 29 IU/L (ref 0–40)
Albumin/Globulin Ratio: 1.7 (ref 1.2–2.2)
Albumin: 4 g/dL (ref 3.8–4.8)
Alkaline Phosphatase: 133 IU/L — ABNORMAL HIGH (ref 39–117)
BUN/Creatinine Ratio: 12 (ref 9–23)
BUN: 10 mg/dL (ref 6–24)
Bilirubin Total: 0.6 mg/dL (ref 0.0–1.2)
CO2: 21 mmol/L (ref 20–29)
Calcium: 9.1 mg/dL (ref 8.7–10.2)
Chloride: 104 mmol/L (ref 96–106)
Creatinine, Ser: 0.85 mg/dL (ref 0.57–1.00)
GFR calc Af Amer: 92 mL/min/{1.73_m2} (ref >59)
GFR calc non Af Amer: 80 mL/min/{1.73_m2} (ref >59)
Globulin, Total: 2.4 g/dL (ref 1.5–4.5)
Glucose: 93 mg/dL (ref 65–99)
Potassium: 4.1 mmol/L (ref 3.5–5.2)
Sodium: 140 mmol/L (ref 134–144)
Total Protein: 6.4 g/dL (ref 6.0–8.5)

## 2019-01-14 LAB — CBC WITH DIFFERENTIAL/PLATELET
Basophils Absolute: 0 10*3/uL (ref 0.0–0.2)
Basos: 1 %
EOS (ABSOLUTE): 0.4 10*3/uL (ref 0.0–0.4)
Eos: 7 %
Hematocrit: 43.8 % (ref 34.0–46.6)
Hemoglobin: 15 g/dL (ref 11.1–15.9)
Immature Grans (Abs): 0 10*3/uL (ref 0.0–0.1)
Immature Granulocytes: 1 %
Lymphocytes Absolute: 0.4 10*3/uL — ABNORMAL LOW (ref 0.7–3.1)
Lymphs: 7 %
MCH: 30.9 pg (ref 26.6–33.0)
MCHC: 34.2 g/dL (ref 31.5–35.7)
MCV: 90 fL (ref 79–97)
Monocytes Absolute: 0.8 10*3/uL (ref 0.1–0.9)
Monocytes: 15 %
Neutrophils Absolute: 3.9 10*3/uL (ref 1.4–7.0)
Neutrophils: 69 %
Platelets: 324 10*3/uL (ref 150–450)
RBC: 4.86 x10E6/uL (ref 3.77–5.28)
RDW: 12.4 % (ref 11.7–15.4)
WBC: 5.5 10*3/uL (ref 3.4–10.8)

## 2019-01-14 LAB — VITAMIN B12: Vitamin B-12: 1130 pg/mL (ref 232–1245)

## 2019-01-14 NOTE — Telephone Encounter (Signed)
LVM informing patient her labs while on Gilenya show Vitamin B12 looks good while on supplement. The absolute lymphocyte is 0.4, which is okay on Gilenya. Alkaline phosphatase is mildly elevated, but it has been so in the past. We will continue to monitor this. Left number for questions.

## 2019-01-26 ENCOUNTER — Telehealth: Payer: Self-pay

## 2019-01-26 NOTE — Telephone Encounter (Signed)
Received fax from [facility] requesting most recent insurance card information. I printed cards and faxed back to ( F # [phone] [id number]) confirmation has been received.

## 2019-01-27 ENCOUNTER — Other Ambulatory Visit: Payer: Self-pay | Admitting: Neurology

## 2019-01-27 MED ORDER — AMPHETAMINE-DEXTROAMPHETAMINE 10 MG PO TABS: 10.0000 mg | ORAL_TABLET | Freq: Every day | ORAL | 0 refills | Status: DC

## 2019-01-27 NOTE — Telephone Encounter (Signed)
Pt is requesting a refill of amphetamine-dextroamphetamine (ADDERALL) 10 MG tablet, [id number] [place],  - [place].

## 2019-01-27 NOTE — Telephone Encounter (Signed)
[place] Database Verified LR: 12-28-2018 Qty: 30 Pending appointment: 07/15/2019 (Dr. Jannifer [place])

## 2019-02-24 ENCOUNTER — Other Ambulatory Visit: Payer: Self-pay | Admitting: Neurology

## 2019-02-24 NOTE — Telephone Encounter (Signed)
Pt called needing a refill on her amphetamine-dextroamphetamine (ADDERALL) 10 MG tablet [facility] [address]

## 2019-02-24 NOTE — Telephone Encounter (Signed)
[place] drug registry has been verified. Last refill was 01/28/19 # 30 for a 30 day supply. SS, NP was the last to refill. I am going to forward to her.

## 2019-02-25 MED ORDER — AMPHETAMINE-DEXTROAMPHETAMINE 10 MG PO TABS: 10.0000 mg | ORAL_TABLET | Freq: Every day | ORAL | 0 refills | Status: DC

## 2019-02-25 NOTE — Telephone Encounter (Signed)
Prescription for Adderall was sent in, requested from pharmacy not to fill until November 14.

## 2019-03-01 ENCOUNTER — Telehealth: Payer: Self-pay | Admitting: Neurology

## 2019-03-01 MED ORDER — AMPHETAMINE-DEXTROAMPHETAMINE 10 MG PO TABS: 10.0000 mg | ORAL_TABLET | Freq: Every day | ORAL | 0 refills | Status: DC

## 2019-03-01 NOTE — Telephone Encounter (Signed)
1) Medication(s) Requested (by name): adderall 2) Pharmacy of Choice: [facility]  3) Special Requests:  [facility] called stating [facility] [address] and was not able to get It filled but would like to know if it could just be sent to the mail order pharmacy [facility]. Please follow up.

## 2019-03-01 NOTE — Telephone Encounter (Signed)
I contacted the pt and left a vm (ok per Dpr) advising we typically send to local pharmacy for Adderall since 90 day supply is not given. I further advised I would ask work in [place] to refill medication since Dr. Jannifer [place] is out of the office.   [place] drug registry verified. Last refill was 01/28/19 # 30 for a 30 day supply. Maryjean Ka was the last provider to rx med.

## 2019-03-01 NOTE — Addendum Note (Signed)
Addended by: Verlin Grills T on: 03/01/2019 02:53 PM   Modules accepted: Orders

## 2019-03-10 ENCOUNTER — Encounter: Payer: Self-pay | Admitting: Neurology

## 2019-03-10 ENCOUNTER — Telehealth: Payer: Self-pay | Admitting: *Deleted

## 2019-03-10 NOTE — Telephone Encounter (Signed)
LMVM for pt to return call.   

## 2019-03-10 NOTE — Telephone Encounter (Signed)
Was responded to by [employer].

## 2019-03-10 NOTE — Telephone Encounter (Signed)
I called pt and LMVM for her due to adderall.  I called p[facility]ramcy spoke to [facility],  She stated that the brand they have used for her is out, they have not been able to get.  So the gave her another brand, called [place]incroft. It is the same but different generic.  ? Giving her headaches. LMVM to return call.

## 2019-03-29 ENCOUNTER — Other Ambulatory Visit: Payer: Self-pay

## 2019-03-29 DIAGNOSIS — L03115 Cellulitis of right lower limb: Secondary | ICD-10-CM | POA: Diagnosis not present

## 2019-03-29 DIAGNOSIS — R0981 Nasal congestion: Secondary | ICD-10-CM | POA: Diagnosis not present

## 2019-03-29 MED ORDER — AMPHETAMINE-DEXTROAMPHETAMINE 10 MG PO TABS: 10.0000 mg | ORAL_TABLET | Freq: Every day | ORAL | 0 refills | Status: DC

## 2019-03-30 ENCOUNTER — Other Ambulatory Visit: Payer: Self-pay | Admitting: Neurology

## 2019-03-31 ENCOUNTER — Other Ambulatory Visit: Payer: Self-pay | Admitting: Neurology

## 2019-04-29 ENCOUNTER — Other Ambulatory Visit: Payer: Self-pay | Admitting: Neurology

## 2019-04-29 MED ORDER — AMPHETAMINE-DEXTROAMPHETAMINE 10 MG PO TABS: 10.0000 mg | ORAL_TABLET | Freq: Every day | ORAL | 0 refills | Status: DC

## 2019-04-29 NOTE — Telephone Encounter (Signed)
Pt is requesting a refill of amphetamine-dextroamphetamine (ADDERALL) 10 MG tablet , [id number] [place], [place] - [address]

## 2019-05-18 ENCOUNTER — Other Ambulatory Visit: Payer: Self-pay | Admitting: Neurology

## 2019-05-18 ENCOUNTER — Telehealth: Payer: Self-pay | Admitting: Neurology

## 2019-05-18 NOTE — Telephone Encounter (Signed)
Pt called stating that she has been having pain in her neck, shoulder and numbness in her fingers and her PCP is wanting to order a MRI for her so the pt would like to know that since she has to have one done in April here should they be combined. Please advise.

## 2019-05-18 NOTE — Telephone Encounter (Signed)
Pt called.  She is having issues with pain  Neck, shoulder, numbness.  Is currently on prednisone has ease some.  Her question for [place] is if her pcp orders imaging? Can the MRI brain be ordered as well (in your last note) you mention this, so she only has to go in once.  (her pcp has not ordered the test as yet).  I relayed to pt if ordered, we have done MRI cervical w/wo .

## 2019-05-18 NOTE — Telephone Encounter (Addendum)
Sure, it would be fine to go ahead and get MRI of the brain with and without contrast when she get MRI of cervical spine. Last MRI of cervical spine in 2019 did not show any MS lesions.

## 2019-05-18 NOTE — Telephone Encounter (Signed)
I called pt and I relayed that SS/NP will go ahead and get MRI brain w/wo when she gets MRI cervical if that is what he orders so as to going once at GI site.  She verbalized understanding and will call us back.

## 2019-05-27 ENCOUNTER — Other Ambulatory Visit: Payer: Self-pay | Admitting: Neurology

## 2019-05-27 MED ORDER — AMPHETAMINE-DEXTROAMPHETAMINE 10 MG PO TABS: 10.0000 mg | ORAL_TABLET | Freq: Every day | ORAL | 0 refills | Status: DC

## 2019-06-03 ENCOUNTER — Encounter: Payer: Self-pay | Admitting: Neurology

## 2019-06-14 DIAGNOSIS — M531 Cervicobrachial syndrome: Secondary | ICD-10-CM | POA: Diagnosis not present

## 2019-06-14 DIAGNOSIS — M50322 Other cervical disc degeneration at C5-C6 level: Secondary | ICD-10-CM | POA: Diagnosis not present

## 2019-06-14 DIAGNOSIS — M50321 Other cervical disc degeneration at C4-C5 level: Secondary | ICD-10-CM | POA: Diagnosis not present

## 2019-06-14 DIAGNOSIS — M9901 Segmental and somatic dysfunction of cervical region: Secondary | ICD-10-CM | POA: Diagnosis not present

## 2019-06-14 DIAGNOSIS — R519 Headache, unspecified: Secondary | ICD-10-CM | POA: Diagnosis not present

## 2019-06-16 DIAGNOSIS — M9901 Segmental and somatic dysfunction of cervical region: Secondary | ICD-10-CM | POA: Diagnosis not present

## 2019-06-16 DIAGNOSIS — M50321 Other cervical disc degeneration at C4-C5 level: Secondary | ICD-10-CM | POA: Diagnosis not present

## 2019-06-16 DIAGNOSIS — M50322 Other cervical disc degeneration at C5-C6 level: Secondary | ICD-10-CM | POA: Diagnosis not present

## 2019-06-16 DIAGNOSIS — R519 Headache, unspecified: Secondary | ICD-10-CM | POA: Diagnosis not present

## 2019-06-16 DIAGNOSIS — M531 Cervicobrachial syndrome: Secondary | ICD-10-CM | POA: Diagnosis not present

## 2019-06-18 DIAGNOSIS — L819 Disorder of pigmentation, unspecified: Secondary | ICD-10-CM | POA: Diagnosis not present

## 2019-06-18 DIAGNOSIS — I70213 Atherosclerosis of native arteries of extremities with intermittent claudication, bilateral legs: Secondary | ICD-10-CM | POA: Diagnosis not present

## 2019-06-18 DIAGNOSIS — I739 Peripheral vascular disease, unspecified: Secondary | ICD-10-CM | POA: Diagnosis not present

## 2019-06-18 DIAGNOSIS — R0989 Other specified symptoms and signs involving the circulatory and respiratory systems: Secondary | ICD-10-CM | POA: Diagnosis not present

## 2019-06-18 DIAGNOSIS — M542 Cervicalgia: Secondary | ICD-10-CM | POA: Diagnosis not present

## 2019-06-21 ENCOUNTER — Other Ambulatory Visit: Payer: Self-pay | Admitting: Sports Medicine

## 2019-06-21 DIAGNOSIS — M50322 Other cervical disc degeneration at C5-C6 level: Secondary | ICD-10-CM | POA: Diagnosis not present

## 2019-06-21 DIAGNOSIS — R519 Headache, unspecified: Secondary | ICD-10-CM | POA: Diagnosis not present

## 2019-06-21 DIAGNOSIS — M531 Cervicobrachial syndrome: Secondary | ICD-10-CM | POA: Diagnosis not present

## 2019-06-21 DIAGNOSIS — M1711 Unilateral primary osteoarthritis, right knee: Secondary | ICD-10-CM | POA: Diagnosis not present

## 2019-06-21 DIAGNOSIS — M50321 Other cervical disc degeneration at C4-C5 level: Secondary | ICD-10-CM | POA: Diagnosis not present

## 2019-06-21 DIAGNOSIS — M5030 Other cervical disc degeneration, unspecified cervical region: Secondary | ICD-10-CM | POA: Diagnosis not present

## 2019-06-21 DIAGNOSIS — M542 Cervicalgia: Secondary | ICD-10-CM

## 2019-06-21 DIAGNOSIS — M9901 Segmental and somatic dysfunction of cervical region: Secondary | ICD-10-CM | POA: Diagnosis not present

## 2019-06-22 DIAGNOSIS — M25511 Pain in right shoulder: Secondary | ICD-10-CM | POA: Diagnosis not present

## 2019-06-22 DIAGNOSIS — M25512 Pain in left shoulder: Secondary | ICD-10-CM | POA: Diagnosis not present

## 2019-06-22 DIAGNOSIS — M4802 Spinal stenosis, cervical region: Secondary | ICD-10-CM | POA: Diagnosis not present

## 2019-06-22 DIAGNOSIS — M5412 Radiculopathy, cervical region: Secondary | ICD-10-CM | POA: Diagnosis not present

## 2019-06-22 DIAGNOSIS — G35 Multiple sclerosis: Secondary | ICD-10-CM | POA: Diagnosis not present

## 2019-06-23 DIAGNOSIS — M50321 Other cervical disc degeneration at C4-C5 level: Secondary | ICD-10-CM | POA: Diagnosis not present

## 2019-06-23 DIAGNOSIS — M50322 Other cervical disc degeneration at C5-C6 level: Secondary | ICD-10-CM | POA: Diagnosis not present

## 2019-06-23 DIAGNOSIS — M531 Cervicobrachial syndrome: Secondary | ICD-10-CM | POA: Diagnosis not present

## 2019-06-23 DIAGNOSIS — M9901 Segmental and somatic dysfunction of cervical region: Secondary | ICD-10-CM | POA: Diagnosis not present

## 2019-06-23 DIAGNOSIS — R519 Headache, unspecified: Secondary | ICD-10-CM | POA: Diagnosis not present

## 2019-06-28 ENCOUNTER — Other Ambulatory Visit: Payer: Self-pay

## 2019-06-28 ENCOUNTER — Ambulatory Visit
Admission: RE | Admit: 2019-06-28 | Discharge: 2019-06-28 | Disposition: A | Discharge status: Home / Self Care | Payer: Medicare Other | Source: Ambulatory Visit | Attending: Sports Medicine | Admitting: Sports Medicine

## 2019-06-28 DIAGNOSIS — M542 Cervicalgia: Secondary | ICD-10-CM

## 2019-06-28 MED ORDER — TRIAMCINOLONE ACETONIDE 40 MG/ML IJ SUSP (RADIOLOGY)
60.0000 mg | Freq: Once | INTRAMUSCULAR | Status: AC
  Administered 2019-06-28: 60 mg via EPIDURAL

## 2019-06-28 MED ORDER — IOPAMIDOL (ISOVUE-M 300) INJECTION 61%
1.0000 mL | Freq: Once | INTRAMUSCULAR | Status: AC
  Administered 2019-06-28: 1 mL via EPIDURAL

## 2019-06-28 NOTE — Discharge Instructions (Signed)

## 2019-06-29 ENCOUNTER — Other Ambulatory Visit: Payer: Self-pay

## 2019-06-29 MED ORDER — AMPHETAMINE-DEXTROAMPHETAMINE 10 MG PO TABS: 10.0000 mg | ORAL_TABLET | Freq: Every day | ORAL | 0 refills | Status: DC

## 2019-06-30 DIAGNOSIS — M50321 Other cervical disc degeneration at C4-C5 level: Secondary | ICD-10-CM | POA: Diagnosis not present

## 2019-06-30 DIAGNOSIS — M50322 Other cervical disc degeneration at C5-C6 level: Secondary | ICD-10-CM | POA: Diagnosis not present

## 2019-06-30 DIAGNOSIS — R519 Headache, unspecified: Secondary | ICD-10-CM | POA: Diagnosis not present

## 2019-06-30 DIAGNOSIS — M531 Cervicobrachial syndrome: Secondary | ICD-10-CM | POA: Diagnosis not present

## 2019-06-30 DIAGNOSIS — M9901 Segmental and somatic dysfunction of cervical region: Secondary | ICD-10-CM | POA: Diagnosis not present

## 2019-07-05 DIAGNOSIS — M9901 Segmental and somatic dysfunction of cervical region: Secondary | ICD-10-CM | POA: Diagnosis not present

## 2019-07-05 DIAGNOSIS — M50321 Other cervical disc degeneration at C4-C5 level: Secondary | ICD-10-CM | POA: Diagnosis not present

## 2019-07-05 DIAGNOSIS — M50322 Other cervical disc degeneration at C5-C6 level: Secondary | ICD-10-CM | POA: Diagnosis not present

## 2019-07-05 DIAGNOSIS — M531 Cervicobrachial syndrome: Secondary | ICD-10-CM | POA: Diagnosis not present

## 2019-07-05 DIAGNOSIS — R519 Headache, unspecified: Secondary | ICD-10-CM | POA: Diagnosis not present

## 2019-07-07 DIAGNOSIS — M50322 Other cervical disc degeneration at C5-C6 level: Secondary | ICD-10-CM | POA: Diagnosis not present

## 2019-07-07 DIAGNOSIS — M531 Cervicobrachial syndrome: Secondary | ICD-10-CM | POA: Diagnosis not present

## 2019-07-07 DIAGNOSIS — M9901 Segmental and somatic dysfunction of cervical region: Secondary | ICD-10-CM | POA: Diagnosis not present

## 2019-07-07 DIAGNOSIS — R519 Headache, unspecified: Secondary | ICD-10-CM | POA: Diagnosis not present

## 2019-07-07 DIAGNOSIS — M50321 Other cervical disc degeneration at C4-C5 level: Secondary | ICD-10-CM | POA: Diagnosis not present

## 2019-07-12 DIAGNOSIS — R519 Headache, unspecified: Secondary | ICD-10-CM | POA: Diagnosis not present

## 2019-07-12 DIAGNOSIS — M531 Cervicobrachial syndrome: Secondary | ICD-10-CM | POA: Diagnosis not present

## 2019-07-12 DIAGNOSIS — M9901 Segmental and somatic dysfunction of cervical region: Secondary | ICD-10-CM | POA: Diagnosis not present

## 2019-07-12 DIAGNOSIS — M50321 Other cervical disc degeneration at C4-C5 level: Secondary | ICD-10-CM | POA: Diagnosis not present

## 2019-07-12 DIAGNOSIS — M50322 Other cervical disc degeneration at C5-C6 level: Secondary | ICD-10-CM | POA: Diagnosis not present

## 2019-07-14 DIAGNOSIS — M9901 Segmental and somatic dysfunction of cervical region: Secondary | ICD-10-CM | POA: Diagnosis not present

## 2019-07-14 DIAGNOSIS — R519 Headache, unspecified: Secondary | ICD-10-CM | POA: Diagnosis not present

## 2019-07-14 DIAGNOSIS — M50321 Other cervical disc degeneration at C4-C5 level: Secondary | ICD-10-CM | POA: Diagnosis not present

## 2019-07-14 DIAGNOSIS — M531 Cervicobrachial syndrome: Secondary | ICD-10-CM | POA: Diagnosis not present

## 2019-07-14 DIAGNOSIS — M50322 Other cervical disc degeneration at C5-C6 level: Secondary | ICD-10-CM | POA: Diagnosis not present

## 2019-07-15 ENCOUNTER — Other Ambulatory Visit: Payer: Self-pay

## 2019-07-15 ENCOUNTER — Encounter: Payer: Self-pay | Admitting: Neurology

## 2019-07-15 ENCOUNTER — Ambulatory Visit (INDEPENDENT_AMBULATORY_CARE_PROVIDER_SITE_OTHER): Payer: Medicare Other | Admitting: Neurology

## 2019-07-15 VITALS — BP 140/80 | HR 82 | Temp 98.1°F | Ht 63.0 in | Wt 200.5 lb

## 2019-07-15 DIAGNOSIS — G43109 Migraine with aura, not intractable, without status migrainosus: Secondary | ICD-10-CM

## 2019-07-15 DIAGNOSIS — G35 Multiple sclerosis: Secondary | ICD-10-CM | POA: Diagnosis not present

## 2019-07-15 DIAGNOSIS — Z5181 Encounter for therapeutic drug level monitoring: Secondary | ICD-10-CM

## 2019-07-15 NOTE — Progress Notes (Signed)
Reason for visit: Multiple sclerosis  Sue Sanchez is an 49 y.o. female  History of present illness:  Sue Sanchez is a 49 year old right-handed white female with a history of migraine headaches and multiple sclerosis.  The patient has been on Gilenya for a number of years and has tolerated the medication well.  Overall, she has been relatively stable with her MS.  She recently had some problems with neck pain and pain going down the right arm with numbness and tingling into the thumb, index finger, and middle finger.  She is followed through [facility], she had a recent MRI of the cervical spine was told that she had a pinched nerve, she is getting epidural steroid injections.  The patient is still having some discomfort.  She has also seen a chiropractor which has helped some, the patient was having daily headaches prior to the chiropractic treatments.  Fortunately, her headaches have become under good control at this time.  The patient denies any significant changes in her balance, at times she feels that if her legs are weak, she may use a cane for ambulation intermittently.  She denies any visual changes.  If she tilts her head to the right, she can induce pain down the right arm.  She denies any problems controlling bowels but she does have occasional urinary incontinence.  Her last MRI of the brain was 2 years ago.  Past Medical History:  Diagnosis Date  . Abnormality of gait 03/03/2015  . B12 deficiency 08/31/2014  . Chronic fatigue 08/31/2014  . Classic migraine 01/12/2016  . COPD (chronic obstructive pulmonary disease) ([place])   . Gastroesophageal reflux disease   . Migraine   . MS (multiple sclerosis) ([place])   . Numbness    bilateral lower extremity  . Obesity   . Osteoarthritis    diagnosed by orthopedics  . Vitamin D deficiency     Past Surgical History:  Procedure Laterality Date  . CESAREAN SECTION    . COSMETIC SURGERY     lower right leg  . FOOT SURGERY       Club feet  . NASAL SINUS SURGERY     10/23/2016,,  dev septum, other  . TUBAL LIGATION Bilateral     Family History  Problem Relation Age of Onset  . Diabetes Mother   . Bipolar disorder Sister   . Bipolar disorder Brother   . Cancer Maternal Grandmother   . COPD Paternal Grandfather   . Heart disease Paternal Grandfather     Social history:  reports that she has been smoking cigarettes. She has a 15.00 pack-year smoking history. She has never used smokeless tobacco. She reports that she does not drink alcohol or use drugs.   No Known Allergies  Medications:  Prior to Admission medications   Medication Sig Start Date End Date Taking? Authorizing Provider  amphetamine-dextroamphetamine (ADDERALL) 10 MG tablet Take 1 tablet (10 mg total) by mouth daily with breakfast. 06/29/19  Yes Kathrynn Ducking, MD  budesonide-formoterol [facility]) 160-4.5 MCG/ACT inhaler Inhale 2 puffs into the lungs 2 (two) times daily.   Yes [provider]  [facility] D3 25 MCG (1000 UT) capsule TAKE 1 CAPSULE BY MOUTH DAILY. 10/15/18  Yes Suzzanne Cloud, NP  Cyanocobalamin (B-12 PO) Take 1 tablet by mouth daily.    Yes [provider]  cyclobenzaprine (FLEXERIL) 5 MG tablet TAKE 2 TABLETS BY MOUTH AT  BEDTIME AS NEEDED FOR  MUSCLE SPASMS 05/20/19  Yes Suzzanne Cloud,  NP  escitalopram (LEXAPRO) 10 MG tablet TAKE 1 TABLET BY MOUTH EVERY DAY 08/03/18  Yes York Spaniel, MD  Esomeprazole Magnesium (NEXIUM PO) Take 1 tablet by mouth 2 (two) times daily.   Yes [provider]  Fingolimod HCl (GILENYA) 0.5 MG CAPS Take 1 capsule (0.5 mg total) by mouth daily. 08/13/18  Yes Glean Salvo, NP  ibuprofen (ADVIL,MOTRIN) 200 MG tablet Take 3 tablets (600 mg total) by mouth every 6 (six) hours as needed for headache or moderate pain. 04/24/15  Yes Loren Racer, MD  propranolol (INDERAL) 40 MG tablet TAKE 1 TABLET BY MOUTH  TWICE DAILY 04/01/19  Yes Glean Salvo, NP  PROVENTIL HFA 108 (90 BASE)  MCG/ACT inhaler Inhale 2 puffs into the lungs every 6 (six) hours as needed for wheezing or shortness of breath.  12/28/12  Yes [provider]    ROS:  Out of a complete 14 system review of symptoms, the patient complains only of the following symptoms, and all other reviewed systems are negative.  Neck pain, right arm pain Urinary incontinence Walking difficulty Headache  Blood pressure 140/80, pulse 82, temperature 98.1 F (36.7 C), height 5\' 3"  (1.6 m), weight 200 lb 8 oz (90.9 kg).  Physical Exam  General: The patient is alert and cooperative at the time of the examination.  The patient is moderately to markedly obese.  Skin: 1+ edema below the knees is seen bilaterally.   Neurologic Exam  Mental status: The patient is alert and oriented x 3 at the time of the examination. The patient has apparent normal recent and remote memory, with an apparently normal attention span and concentration ability.   Cranial nerves: Facial symmetry is present. Speech is normal, no aphasia or dysarthria is noted. Extraocular movements are full. Visual fields are full.  Pupils are equal, round, and reactive to light.  Discs are flat laterally.  Motor: The patient has good strength in all 4 extremities.  Sensory examination: Soft touch sensation is symmetric on the face, arms, and legs.  Coordination: The patient has good finger-nose-finger and heel-to-shin bilaterally.  Gait and station: The patient has a slightly wide-based gait. Tandem gait is slightly unsteady. Romberg is negative, but is slightly unsteady. No drift is seen.  Reflexes: Deep tendon reflexes are symmetric.   Assessment/Plan:  1.  Multiple sclerosis  2.  History of migraine headache  3.  Neck pain, right arm pain, cervical radiculopathy  4.  Mild gait disturbance  The patient will continue on Gilenya for now, we will check blood work today.  MRI of the brain will be set up with and without gadolinium  enhancement.  She will follow-up here in 6 months.  MD 07/15/2019 3:58 PM  [facility] [address] [place], [place]  Phone [phone] Fax [phone]

## 2019-07-16 LAB — CBC WITH DIFFERENTIAL/PLATELET
Basophils Absolute: 0 10*3/uL (ref 0.0–0.2)
Basos: 1 %
EOS (ABSOLUTE): 0.3 10*3/uL (ref 0.0–0.4)
Eos: 3 %
Hematocrit: 44 % (ref 34.0–46.6)
Hemoglobin: 14.9 g/dL (ref 11.1–15.9)
Immature Grans (Abs): 0.1 10*3/uL (ref 0.0–0.1)
Immature Granulocytes: 1 %
Lymphocytes Absolute: 0.4 10*3/uL — ABNORMAL LOW (ref 0.7–3.1)
Lymphs: 5 %
MCH: 30.8 pg (ref 26.6–33.0)
MCHC: 33.9 g/dL (ref 31.5–35.7)
MCV: 91 fL (ref 79–97)
Monocytes Absolute: 1.1 10*3/uL — ABNORMAL HIGH (ref 0.1–0.9)
Monocytes: 13 %
Neutrophils Absolute: 6.6 10*3/uL (ref 1.4–7.0)
Neutrophils: 77 %
Platelets: 366 10*3/uL (ref 150–450)
RBC: 4.84 x10E6/uL (ref 3.77–5.28)
RDW: 13.2 % (ref 11.7–15.4)
WBC: 8.5 10*3/uL (ref 3.4–10.8)

## 2019-07-16 LAB — COMPREHENSIVE METABOLIC PANEL
ALT: 62 IU/L — ABNORMAL HIGH (ref 0–32)
AST: 30 IU/L (ref 0–40)
Albumin/Globulin Ratio: 1.8 (ref 1.2–2.2)
Albumin: 4.1 g/dL (ref 3.8–4.8)
Alkaline Phosphatase: 136 IU/L — ABNORMAL HIGH (ref 39–117)
BUN/Creatinine Ratio: 11 (ref 9–23)
BUN: 10 mg/dL (ref 6–24)
Bilirubin Total: 0.2 mg/dL (ref 0.0–1.2)
CO2: 22 mmol/L (ref 20–29)
Calcium: 9.3 mg/dL (ref 8.7–10.2)
Chloride: 102 mmol/L (ref 96–106)
Creatinine, Ser: 0.94 mg/dL (ref 0.57–1.00)
GFR calc Af Amer: 82 mL/min/{1.73_m2} (ref >59)
GFR calc non Af Amer: 71 mL/min/{1.73_m2} (ref >59)
Globulin, Total: 2.3 g/dL (ref 1.5–4.5)
Glucose: 92 mg/dL (ref 65–99)
Potassium: 4.2 mmol/L (ref 3.5–5.2)
Sodium: 139 mmol/L (ref 134–144)
Total Protein: 6.4 g/dL (ref 6.0–8.5)

## 2019-07-19 ENCOUNTER — Telehealth: Payer: Self-pay | Admitting: Neurology

## 2019-07-19 DIAGNOSIS — R519 Headache, unspecified: Secondary | ICD-10-CM | POA: Diagnosis not present

## 2019-07-19 DIAGNOSIS — M531 Cervicobrachial syndrome: Secondary | ICD-10-CM | POA: Diagnosis not present

## 2019-07-19 DIAGNOSIS — M50321 Other cervical disc degeneration at C4-C5 level: Secondary | ICD-10-CM | POA: Diagnosis not present

## 2019-07-19 DIAGNOSIS — M9901 Segmental and somatic dysfunction of cervical region: Secondary | ICD-10-CM | POA: Diagnosis not present

## 2019-07-19 DIAGNOSIS — M50322 Other cervical disc degeneration at C5-C6 level: Secondary | ICD-10-CM | POA: Diagnosis not present

## 2019-07-19 NOTE — Telephone Encounter (Signed)
[employer] medicare/medicaid order sent to GI. No auth they will reach out to the patient to schedule.  °

## 2019-07-21 DIAGNOSIS — M531 Cervicobrachial syndrome: Secondary | ICD-10-CM | POA: Diagnosis not present

## 2019-07-21 DIAGNOSIS — R519 Headache, unspecified: Secondary | ICD-10-CM | POA: Diagnosis not present

## 2019-07-21 DIAGNOSIS — M9901 Segmental and somatic dysfunction of cervical region: Secondary | ICD-10-CM | POA: Diagnosis not present

## 2019-07-21 DIAGNOSIS — M50321 Other cervical disc degeneration at C4-C5 level: Secondary | ICD-10-CM | POA: Diagnosis not present

## 2019-07-21 DIAGNOSIS — M50322 Other cervical disc degeneration at C5-C6 level: Secondary | ICD-10-CM | POA: Diagnosis not present

## 2019-07-28 ENCOUNTER — Other Ambulatory Visit: Payer: Self-pay

## 2019-07-28 MED ORDER — AMPHETAMINE-DEXTROAMPHETAMINE 10 MG PO TABS: 10.0000 mg | ORAL_TABLET | Freq: Every day | ORAL | 0 refills | Status: DC

## 2019-07-28 NOTE — Telephone Encounter (Signed)
Erroneously send to me. CD

## 2019-08-02 DIAGNOSIS — M9901 Segmental and somatic dysfunction of cervical region: Secondary | ICD-10-CM | POA: Diagnosis not present

## 2019-08-02 DIAGNOSIS — M50321 Other cervical disc degeneration at C4-C5 level: Secondary | ICD-10-CM | POA: Diagnosis not present

## 2019-08-02 DIAGNOSIS — R519 Headache, unspecified: Secondary | ICD-10-CM | POA: Diagnosis not present

## 2019-08-02 DIAGNOSIS — M531 Cervicobrachial syndrome: Secondary | ICD-10-CM | POA: Diagnosis not present

## 2019-08-02 DIAGNOSIS — M50322 Other cervical disc degeneration at C5-C6 level: Secondary | ICD-10-CM | POA: Diagnosis not present

## 2019-08-14 ENCOUNTER — Other Ambulatory Visit: Payer: Self-pay

## 2019-08-14 ENCOUNTER — Ambulatory Visit
Admission: RE | Admit: 2019-08-14 | Discharge: 2019-08-14 | Disposition: A | Discharge status: Home / Self Care | Payer: Medicare Other | Source: Ambulatory Visit | Attending: Neurology | Admitting: Neurology

## 2019-08-14 ENCOUNTER — Other Ambulatory Visit: Payer: Medicare Other

## 2019-08-14 DIAGNOSIS — G35 Multiple sclerosis: Secondary | ICD-10-CM

## 2019-08-14 MED ORDER — GADOBENATE DIMEGLUMINE 529 MG/ML IV SOLN
15.0000 mL | Freq: Once | INTRAVENOUS | Status: AC | PRN
  Administered 2019-08-14: 14:00:00 15 mL via INTRAVENOUS

## 2019-08-16 ENCOUNTER — Telehealth: Payer: Self-pay | Admitting: Neurology

## 2019-08-16 NOTE — Telephone Encounter (Signed)
  Called the patient.  MRI of the brain is stable from last study done in 2018, minimal MS involvement.  The patient remains on Gilenya.   MRI brain 08/14/19:  IMPRESSION: This MRI of the brain with and without contrast shows the following: 1.   Scattered T2/FLAIR hyperintense foci in the hemispheres in a pattern configuration consistent with chronic demyelinating plaque associated with multiple sclerosis.  None of the foci appear to be acute.  The overall plaque burden is light.  Compared to the MRI dated 11/17/2016, there are no new lesions. 2.   Chronic maxillary sinusitis, right greater than left. 3.   There are no acute findings and there is a normal enhancement pattern.

## 2019-08-24 ENCOUNTER — Other Ambulatory Visit: Payer: Self-pay | Admitting: *Deleted

## 2019-08-24 MED ORDER — GILENYA 0.5 MG PO CAPS: 0.5000 mg | ORAL_CAPSULE | Freq: Every day | ORAL | 11 refills | Status: DC

## 2019-08-28 ENCOUNTER — Other Ambulatory Visit: Payer: Self-pay

## 2019-08-30 ENCOUNTER — Other Ambulatory Visit: Payer: Self-pay

## 2019-08-31 ENCOUNTER — Other Ambulatory Visit: Payer: Self-pay

## 2019-08-31 MED ORDER — AMPHETAMINE-DEXTROAMPHETAMINE 10 MG PO TABS: 10.0000 mg | ORAL_TABLET | Freq: Every day | ORAL | 0 refills | Status: DC

## 2019-09-24 DIAGNOSIS — Z0000 Encounter for general adult medical examination without abnormal findings: Secondary | ICD-10-CM | POA: Diagnosis not present

## 2019-09-24 DIAGNOSIS — J449 Chronic obstructive pulmonary disease, unspecified: Secondary | ICD-10-CM | POA: Diagnosis not present

## 2019-09-24 DIAGNOSIS — E538 Deficiency of other specified B group vitamins: Secondary | ICD-10-CM | POA: Diagnosis not present

## 2019-09-24 DIAGNOSIS — E559 Vitamin D deficiency, unspecified: Secondary | ICD-10-CM | POA: Diagnosis not present

## 2019-09-24 DIAGNOSIS — Z23 Encounter for immunization: Secondary | ICD-10-CM | POA: Diagnosis not present

## 2019-09-30 ENCOUNTER — Other Ambulatory Visit: Payer: Self-pay

## 2019-09-30 MED ORDER — AMPHETAMINE-DEXTROAMPHETAMINE 10 MG PO TABS: 10.0000 mg | ORAL_TABLET | Freq: Every day | ORAL | 0 refills | Status: DC

## 2019-10-06 DIAGNOSIS — Z1231 Encounter for screening mammogram for malignant neoplasm of breast: Secondary | ICD-10-CM | POA: Diagnosis not present

## 2019-10-18 DIAGNOSIS — Z1211 Encounter for screening for malignant neoplasm of colon: Secondary | ICD-10-CM | POA: Diagnosis not present

## 2019-10-18 DIAGNOSIS — Z1212 Encounter for screening for malignant neoplasm of rectum: Secondary | ICD-10-CM | POA: Diagnosis not present

## 2019-10-22 LAB — COLOGUARD: COLOGUARD: NEGATIVE

## 2019-10-26 DIAGNOSIS — G629 Polyneuropathy, unspecified: Secondary | ICD-10-CM | POA: Diagnosis not present

## 2019-10-26 DIAGNOSIS — R600 Localized edema: Secondary | ICD-10-CM | POA: Diagnosis not present

## 2019-10-26 DIAGNOSIS — G35 Multiple sclerosis: Secondary | ICD-10-CM | POA: Diagnosis not present

## 2019-10-26 DIAGNOSIS — M5412 Radiculopathy, cervical region: Secondary | ICD-10-CM | POA: Diagnosis not present

## 2019-10-27 ENCOUNTER — Other Ambulatory Visit: Payer: Self-pay

## 2019-10-28 MED ORDER — AMPHETAMINE-DEXTROAMPHETAMINE 10 MG PO TABS: 10.0000 mg | ORAL_TABLET | Freq: Every day | ORAL | 0 refills | Status: DC

## 2019-11-17 ENCOUNTER — Ambulatory Visit (INDEPENDENT_AMBULATORY_CARE_PROVIDER_SITE_OTHER): Payer: Medicare Other

## 2019-11-17 ENCOUNTER — Other Ambulatory Visit: Payer: Self-pay

## 2019-11-17 ENCOUNTER — Other Ambulatory Visit: Payer: Self-pay | Admitting: Sports Medicine

## 2019-11-17 ENCOUNTER — Ambulatory Visit (INDEPENDENT_AMBULATORY_CARE_PROVIDER_SITE_OTHER): Payer: Medicare Other | Admitting: Sports Medicine

## 2019-11-17 DIAGNOSIS — M2060 Acquired deformities of toe(s), unspecified, unspecified foot: Secondary | ICD-10-CM

## 2019-11-17 DIAGNOSIS — M79672 Pain in left foot: Secondary | ICD-10-CM

## 2019-11-17 DIAGNOSIS — M19072 Primary osteoarthritis, left ankle and foot: Secondary | ICD-10-CM | POA: Diagnosis not present

## 2019-11-17 DIAGNOSIS — I739 Peripheral vascular disease, unspecified: Secondary | ICD-10-CM

## 2019-11-17 DIAGNOSIS — M19071 Primary osteoarthritis, right ankle and foot: Secondary | ICD-10-CM

## 2019-11-17 DIAGNOSIS — Q798 Other congenital malformations of musculoskeletal system: Secondary | ICD-10-CM

## 2019-11-17 DIAGNOSIS — B351 Tinea unguium: Secondary | ICD-10-CM

## 2019-11-17 DIAGNOSIS — M19079 Primary osteoarthritis, unspecified ankle and foot: Secondary | ICD-10-CM

## 2019-11-17 DIAGNOSIS — M79671 Pain in right foot: Secondary | ICD-10-CM

## 2019-11-17 DIAGNOSIS — M79609 Pain in unspecified limb: Secondary | ICD-10-CM

## 2019-11-17 DIAGNOSIS — G35 Multiple sclerosis: Secondary | ICD-10-CM

## 2019-11-17 DIAGNOSIS — R269 Unspecified abnormalities of gait and mobility: Secondary | ICD-10-CM

## 2019-11-17 DIAGNOSIS — R5382 Chronic fatigue, unspecified: Secondary | ICD-10-CM

## 2019-11-17 NOTE — Progress Notes (Signed)
Subjective: Stefanee Mckell is a 49 y.o. female patient who presents to office for evaluation of bilateral foot pain. Patient complains of progressive pain especially over the last month off and on 8/10 sharp pains to tops of both feet worse with sitting to long, Norco from PCP helps 3-4 hours and lasix helps a lot with swelling. Admits that she wants her nails trimmed as well. Patient denies any other pedal complaints.  Patient Active Problem List   Diagnosis Date Noted  . Therapeutic drug monitoring 05/29/2018  . Chronic midline low back pain with left-sided sciatica 04/19/2016  . Classic migraine 01/12/2016  . Abnormality of gait 03/03/2015  . Chronic fatigue 08/31/2014  . B12 deficiency 08/31/2014  . Depression with anxiety 07/22/2013  . MS (multiple sclerosis) (HCC) 02/25/2013    Current Outpatient Medications on File Prior to Visit  Medication Sig Dispense Refill  . amphetamine-dextroamphetamine (ADDERALL) 10 MG tablet Take 1 tablet (10 mg total) by mouth daily with breakfast. 30 tablet 0  . budesonide-formoterol (SYMBICORT) 160-4.5 MCG/ACT inhaler Inhale 2 puffs into the lungs 2 (two) times daily.    . [facility] D3 25 MCG (1000 UT) capsule TAKE 1 CAPSULE BY MOUTH DAILY. 90 capsule 3  . Cyanocobalamin (B-12 PO) Take 1 tablet by mouth daily.     . cyclobenzaprine (FLEXERIL) 5 MG tablet TAKE 2 TABLETS BY MOUTH AT  BEDTIME AS NEEDED FOR  MUSCLE SPASMS 60 tablet 5  . escitalopram (LEXAPRO) 10 MG tablet TAKE 1 TABLET BY MOUTH EVERY DAY 90 tablet 3  . Esomeprazole Magnesium (NEXIUM PO) Take 1 tablet by mouth 2 (two) times daily.    . Fingolimod HCl (GILENYA) 0.5 MG CAPS Take 1 capsule (0.5 mg total) by mouth daily. 30 capsule 11  . furosemide (LASIX) 20 MG tablet Take 20 mg by mouth daily as needed.    Marland Kitchen HYDROcodone-acetaminophen (NORCO) 7.5-325 MG tablet Take 1 tablet by mouth 3 (three) times daily as needed.    Marland Kitchen ibuprofen (ADVIL,MOTRIN) 200 MG tablet Take 3 tablets (600 mg total) by mouth  every 6 (six) hours as needed for headache or moderate pain. 30 tablet 0  . propranolol (INDERAL) 40 MG tablet TAKE 1 TABLET BY MOUTH  TWICE DAILY 180 tablet 3  . PROVENTIL HFA 108 (90 BASE) MCG/ACT inhaler Inhale 2 puffs into the lungs every 6 (six) hours as needed for wheezing or shortness of breath.      No current facility-administered medications on file prior to visit.    No Known Allergies  Objective:  General: Alert and oriented x3 in no acute distress  Dermatology:Trophic skin changes, right greater than left foot with surgical scars. No open lesions bilateral lower extremities, no webspace macerations, no ecchymosis bilateral, all nails are mildly elongated and thickened consistent with onychomycosis.   Vascular: Dorsalis Pedis and Posterior Tibial pedal pulses difficult to palpate, Capillary Fill Time 5 seconds,(-) pedal hair growth bilateral, trace edema bilateral lower extremities, Temperature gradient within normal limits.  Neurology: Michaell Cowing sensation intact via light touch bilateral.  Musculoskeletal: Minimal pain to dorsal feet bilateral. There is diffuse changes from clubfoot deformity with atrophy of legs and fixed hammer toes and partial first and second toe amputation on left. Strength 4 and 5 in all groups bilateral.   Right and left foot x-rays. Severe fixed hammertoe deformity with diffuse arthritis and digital deformity. No other acute findings.  Assessment and Plan: Problem List Items Addressed This Visit      Nervous and Auditory  MS (multiple sclerosis) (HCC)     Other   Chronic fatigue   Abnormality of gait    Other Visit Diagnoses    Arthritis of foot    -  Primary   Relevant Medications   HYDROcodone-acetaminophen (NORCO) 7.5-325 MG tablet   Acquired deformity of toe, unspecified laterality       Pain due to onychomycosis of nail       Amniotic band syndrome affecting foot       PVD (peripheral vascular disease) (HCC)       Relevant Medications    furosemide (LASIX) 20 MG tablet       -Complete examination performed -Xrays reviewed -Discussed treatement options -Patient declines injection bilateral -Recommend topical pain cream PRN -At no charge debrided nails using a sterile nail nipper without incident -Patient to return to office as needed or sooner if condition worsens.  Asencion Islam, DPM

## 2019-11-23 ENCOUNTER — Other Ambulatory Visit: Payer: Self-pay | Admitting: Sports Medicine

## 2019-11-23 DIAGNOSIS — M19079 Primary osteoarthritis, unspecified ankle and foot: Secondary | ICD-10-CM

## 2019-11-25 ENCOUNTER — Other Ambulatory Visit: Payer: Self-pay

## 2019-11-26 MED ORDER — AMPHETAMINE-DEXTROAMPHETAMINE 10 MG PO TABS: 10.0000 mg | ORAL_TABLET | Freq: Every day | ORAL | 0 refills | Status: DC

## 2019-12-01 DIAGNOSIS — G629 Polyneuropathy, unspecified: Secondary | ICD-10-CM | POA: Diagnosis not present

## 2019-12-01 DIAGNOSIS — M5412 Radiculopathy, cervical region: Secondary | ICD-10-CM | POA: Diagnosis not present

## 2019-12-01 DIAGNOSIS — G35 Multiple sclerosis: Secondary | ICD-10-CM | POA: Diagnosis not present

## 2019-12-28 ENCOUNTER — Other Ambulatory Visit: Payer: Self-pay

## 2019-12-28 MED ORDER — AMPHETAMINE-DEXTROAMPHETAMINE 10 MG PO TABS: 10.0000 mg | ORAL_TABLET | Freq: Every day | ORAL | 0 refills | Status: DC

## 2020-01-12 DIAGNOSIS — H2513 Age-related nuclear cataract, bilateral: Secondary | ICD-10-CM | POA: Diagnosis not present

## 2020-01-12 DIAGNOSIS — G35 Multiple sclerosis: Secondary | ICD-10-CM | POA: Diagnosis not present

## 2020-01-12 DIAGNOSIS — Z79899 Other long term (current) drug therapy: Secondary | ICD-10-CM | POA: Diagnosis not present

## 2020-01-18 ENCOUNTER — Other Ambulatory Visit: Payer: Self-pay | Admitting: Neurology

## 2020-01-25 ENCOUNTER — Ambulatory Visit (INDEPENDENT_AMBULATORY_CARE_PROVIDER_SITE_OTHER): Payer: Medicare Other | Admitting: Neurology

## 2020-01-25 ENCOUNTER — Encounter: Payer: Self-pay | Admitting: Neurology

## 2020-01-25 VITALS — BP 128/86 | Ht 63.0 in | Wt 202.4 lb

## 2020-01-25 DIAGNOSIS — F418 Other specified anxiety disorders: Secondary | ICD-10-CM

## 2020-01-25 DIAGNOSIS — G35 Multiple sclerosis: Secondary | ICD-10-CM

## 2020-01-25 DIAGNOSIS — R5382 Chronic fatigue, unspecified: Secondary | ICD-10-CM

## 2020-01-25 DIAGNOSIS — G43109 Migraine with aura, not intractable, without status migrainosus: Secondary | ICD-10-CM

## 2020-01-25 MED ORDER — NURTEC 75 MG PO TBDP: 75.0000 mg | ORAL_TABLET | ORAL | 11 refills | Status: DC | PRN

## 2020-01-25 MED ORDER — AMPHETAMINE-DEXTROAMPHETAMINE 10 MG PO TABS: 10.0000 mg | ORAL_TABLET | Freq: Every day | ORAL | 0 refills | Status: DC

## 2020-01-25 MED ORDER — PROPRANOLOL HCL 40 MG PO TABS: 40.0000 mg | ORAL_TABLET | Freq: Two times a day (BID) | ORAL | 3 refills | Status: DC

## 2020-01-25 MED ORDER — CYCLOBENZAPRINE HCL 5 MG PO TABS: ORAL_TABLET | ORAL | 5 refills | Status: DC

## 2020-01-25 MED ORDER — ESCITALOPRAM OXALATE 10 MG PO TABS: 10.0000 mg | ORAL_TABLET | Freq: Every day | ORAL | 3 refills | Status: DC

## 2020-01-25 NOTE — Progress Notes (Signed)
I have read the note, and I agree with the clinical assessment and plan.  Emila Steinhauser K Braedin Millhouse   

## 2020-01-25 NOTE — Progress Notes (Signed)
PATIENT: Sue Sanchez DOB: 01-21-1971  REASON FOR VISIT: follow up HISTORY FROM: patient  HISTORY OF PRESENT ILLNESS: Today 01/25/20 Sue Sanchez is a 49 year old female with history of migraine and multiple sclerosis.  She is on Gilenya.  MRI of the brain in May 2021 was overall stable from 2018, minimal MS involvement.  On Adderall for fatigue, is helpful, noted not as beneficial as when first started, but afraid to increase dose. Feels tired all day. On vitamin D supplement.  Headaches, has aura, gets angry, flashing lights, zigzag's in the visual field, photophobia, followed by headache, are variable, could be 1-3 a month, last week, had 6, does not drink enough water. Will take ibuprofen, hydrocodone and lie down. On hydrocodone for chronic pain. On propanolol 40 mg twice a day. Previously tried and failed Relpax and Imitrex. Interested in a newer agent.  Saw Dr. Dione Booze recently, reportedly exam was normal.  Walking is stable, tendency to drag the right foot, has chronic right knee pain, has pinched nerve in her neck and back, was seeing a chiropractor, insurance would not pay, could not afford $50 co-pay. For the neck, seeing [facility], could do injections, needs surgery, hesitant. 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  Has intermittent urinary retention/urgency. Bowels are doing okay. Had 1 day of left-sided facial pain, resolved. This was present with initial MS presentation.  Lives with her family, boyfriend, walks her dogs, is not very active, considering going to the aquatic center. Smokes. Here today for follow-up unaccompanied.  HISTORY 07/15/2019 Dr. Anne Hahn: Sue Sanchez is a 49 year old right-handed white female with a history of migraine headaches and multiple sclerosis.  The patient has been on Gilenya for a number of years and has tolerated the medication well.  Overall, she has been relatively stable with her MS.  She recently had some  problems with neck pain and pain going down the right arm with numbness and tingling into the thumb, index finger, and middle finger.  She is followed through [facility], she had a recent MRI of the cervical spine was told that she had a pinched nerve, she is getting epidural steroid injections.  The patient is still having some discomfort.  She has also seen a chiropractor which has helped some, the patient was having daily headaches prior to the chiropractic treatments.  Fortunately, her headaches have become under good control at this time.  The patient denies any significant changes in her balance, at times she feels that if her legs are weak, she may use a cane for ambulation intermittently.  She denies any visual changes.  If she tilts her head to the right, she can induce pain down the right arm.  She denies any problems controlling bowels but she does have occasional urinary incontinence.  Her last MRI of the brain was 2 years ago.   REVIEW OF SYSTEMS: Out of a complete 14 system review of symptoms, the patient complains only of the following symptoms, and all other reviewed systems are negative.  Headache  ALLERGIES: No Known Allergies  HOME MEDICATIONS: Outpatient Medications Prior to Visit  Medication Sig Dispense Refill  . budesonide-formoterol (SYMBICORT) 160-4.5 MCG/ACT inhaler Inhale 2 puffs into the lungs 2 (two) times daily.    . [facility] D3 25 MCG (1000 UT) capsule TAKE 1 CAPSULE BY MOUTH DAILY. 90 capsule 3  . Cyanocobalamin (B-12 PO) Take 1 tablet by mouth daily.     . Esomeprazole Magnesium (NEXIUM PO) Take 1 tablet  by mouth 2 (two) times daily.    . Fingolimod HCl (GILENYA) 0.5 MG CAPS Take 1 capsule (0.5 mg total) by mouth daily. 30 capsule 11  . furosemide (LASIX) 20 MG tablet Take 20 mg by mouth daily as needed.    Marland Kitchen HYDROcodone-acetaminophen (NORCO) 7.5-325 MG tablet Take 1 tablet by mouth 3 (three) times daily as needed.    Marland Kitchen ibuprofen (ADVIL,MOTRIN) 200 MG tablet Take 3  tablets (600 mg total) by mouth every 6 (six) hours as needed for headache or moderate pain. 30 tablet 0  . PROVENTIL HFA 108 (90 BASE) MCG/ACT inhaler Inhale 2 puffs into the lungs every 6 (six) hours as needed for wheezing or shortness of breath.     . amphetamine-dextroamphetamine (ADDERALL) 10 MG tablet Take 1 tablet (10 mg total) by mouth daily with breakfast. 30 tablet 0  . cyclobenzaprine (FLEXERIL) 5 MG tablet TAKE 2 TABLETS BY MOUTH AT  BEDTIME AS NEEDED FOR  MUSCLE SPASMS 60 tablet 5  . escitalopram (LEXAPRO) 10 MG tablet TAKE 1 TABLET BY MOUTH EVERY DAY 90 tablet 3  . propranolol (INDERAL) 40 MG tablet TAKE 1 TABLET BY MOUTH  TWICE DAILY 180 tablet 3   No facility-administered medications prior to visit.    PAST MEDICAL HISTORY: Past Medical History:  Diagnosis Date  . Abnormality of gait 03/03/2015  . B12 deficiency 08/31/2014  . Chronic fatigue 08/31/2014  . Classic migraine 01/12/2016  . COPD (chronic obstructive pulmonary disease) (HCC)   . Gastroesophageal reflux disease   . Migraine   . MS (multiple sclerosis) (HCC)   . Numbness    bilateral lower extremity  . Obesity   . Osteoarthritis    diagnosed by orthopedics  . Vitamin D deficiency     PAST SURGICAL HISTORY: Past Surgical History:  Procedure Laterality Date  . CESAREAN SECTION    . COSMETIC SURGERY     lower right leg  . FOOT SURGERY     Club feet  . NASAL SINUS SURGERY     10/23/2016,,  dev septum, other  . TUBAL LIGATION Bilateral     FAMILY HISTORY: Family History  Problem Relation Age of Onset  . Diabetes Mother   . Bipolar disorder Sister   . Bipolar disorder Brother   . Cancer Maternal Grandmother   . COPD Paternal Grandfather   . Heart disease Paternal Grandfather     SOCIAL HISTORY: Social History   Socioeconomic History  . Marital status: Married    Spouse name: Not on file  . Number of children: 2  . Years of education: college  . Highest education level: Not on file   Occupational History  . Occupation: Unemployed  Tobacco Use  . Smoking status: Current Every Day Smoker    Packs/day: 0.50    Years: 30.00    Pack years: 15.00    Types: Cigarettes  . Smokeless tobacco: Never Used  Substance and Sexual Activity  . Alcohol use: No    Alcohol/week: 0.0 standard drinks  . Drug use: No  . Sexual activity: Yes  Other Topics Concern  . Not on file  Social History Narrative   Patient is Divorced with 2 children.   Patient has a college education.   Patient is right handed.   Caffeine Intake: 6 or more sodas daily      Patient has a dog named Butters.   Social Determinants of Health   Financial Resource Strain:   . Difficulty of Paying Living Expenses: Not  on file  Food Insecurity:   . Worried About Programme researcher, broadcasting/film/video in the Last Year: Not on file  . Ran Out of Food in the Last Year: Not on file  Transportation Needs:   . Lack of Transportation (Medical): Not on file  . Lack of Transportation (Non-Medical): Not on file  Physical Activity:   . Days of Exercise per Week: Not on file  . Minutes of Exercise per Session: Not on file  Stress:   . Feeling of Stress : Not on file  Social Connections:   . Frequency of Communication with Friends and Family: Not on file  . Frequency of Social Gatherings with Friends and Family: Not on file  . Attends Religious Services: Not on file  . Active Member of Clubs or Organizations: Not on file  . Attends Banker Meetings: Not on file  . Marital Status: Not on file  Intimate Partner Violence:   . Fear of Current or Ex-Partner: Not on file  . Emotionally Abused: Not on file  . Physically Abused: Not on file  . Sexually Abused: Not on file   PHYSICAL EXAM  Vitals:   01/25/20 1510  BP: 128/86  Weight: 202 lb 6.4 oz (91.8 kg)  Height: 5\' 3"  (1.6 m)   Body mass index is 35.85 kg/m.  Generalized: Well developed, in no acute distress   Neurological examination  Mentation: Alert  oriented to time, place, history taking. Follows all commands speech and language fluent Cranial nerve II-XII: Pupils were equal round reactive to light. Extraocular movements were full, visual field were full on confrontational test. Facial sensation and strength were normal. Head turning and shoulder shrug  were normal and symmetric. Motor: Good strength all extremities Sensory: Sensory testing is intact to soft touch on all 4 extremities. No evidence of extinction is noted.  Coordination: Cerebellar testing reveals good finger-nose-finger and heel-to-shin bilaterally.  Gait and station: Gait is slightly wide-based. Tandem gait is somewhat unsteady. Romberg is negative. No drift is seen.  Reflexes: Deep tendon reflexes are symmetric but slightly increased throughout  DIAGNOSTIC DATA (LABS, IMAGING, TESTING) - I reviewed patient records, labs, notes, testing and imaging myself where available.  Lab Results  Component Value Date   WBC 8.5 07/15/2019   HGB 14.9 07/15/2019   HCT 44.0 07/15/2019   MCV 91 07/15/2019   PLT 366 07/15/2019      Component Value Date/Time   NA 139 07/15/2019 1618   K 4.2 07/15/2019 1618   CL 102 07/15/2019 1618   CO2 22 07/15/2019 1618   GLUCOSE 92 07/15/2019 1618   BUN 10 07/15/2019 1618   CREATININE 0.94 07/15/2019 1618   CALCIUM 9.3 07/15/2019 1618   PROT 6.4 07/15/2019 1618   ALBUMIN 4.1 07/15/2019 1618   AST 30 07/15/2019 1618   ALT 62 (H) 07/15/2019 1618   ALKPHOS 136 (H) 07/15/2019 1618   BILITOT 0.2 07/15/2019 1618   GFRNONAA 71 07/15/2019 1618   GFRAA 82 07/15/2019 1618   No results found for: CHOL, HDL, LDLCALC, LDLDIRECT, TRIG, CHOLHDL No results found for: 09/14/2019 Lab Results  Component Value Date   VITAMINB12 1,130 01/13/2019   No results found for: TSH  ASSESSMENT AND PLAN 49 y.o. year old female  has a past medical history of Abnormality of gait (03/03/2015), B12 deficiency (08/31/2014), Chronic fatigue (08/31/2014), Classic migraine  (01/12/2016), COPD (chronic obstructive pulmonary disease) (HCC), Gastroesophageal reflux disease, Migraine, MS (multiple sclerosis) (HCC), Numbness, Obesity, Osteoarthritis, and Vitamin D  deficiency. here with:  1. Multiple sclerosis, relapsing remitting -Continue on Gilenya -Check routine blood work CBC with differential, CMP -MRI of the brain in May 2021 was overall stable, minimal MS involvement  2. History of migraine headache, with aura -Frequency is variable -Continue propanolol 40 mg twice a day -Try Nurtec for acute headache, previously tried and failed Relpax, Imitrex -May consider addition of Topamax if needed  3. Neck pain, right arm pain, cervical radiculopathy -Following with [facility]  4. Mild gait disturbance -Encouraged consistent exercise, consider aquatic activity  5. Chronic fatigue -Continue Adderall 10 mg daily, we decided to stay at same dose, refill was sent in, last fill 12/31/19 # 30  6. Depression -Continue Lexapro 10 mg daily -Stress level seems to be better  She will follow-up in 6 months or sooner if needed  I spent 30 minutes of face-to-face and non-face-to-face time with patient.  This included previsit chart review, lab review, study review, order entry, electronic health record documentation, patient education.  Margie Ege, AGNP-C, DNP 01/25/2020, 4:05 PM [facility] [address], [address] [place], [place] [address] [phone]

## 2020-01-25 NOTE — Patient Instructions (Signed)
Try Nurtec for acute headache  Continue other medications Check blood work today  Stay on Gilenya See you back in 6 months

## 2020-01-26 ENCOUNTER — Telehealth: Payer: Self-pay | Admitting: Neurology

## 2020-01-26 LAB — COMPREHENSIVE METABOLIC PANEL
ALT: 21 IU/L (ref 0–32)
AST: 17 IU/L (ref 0–40)
Albumin/Globulin Ratio: 1.7 (ref 1.2–2.2)
Albumin: 4.3 g/dL (ref 3.8–4.9)
Alkaline Phosphatase: 140 IU/L — ABNORMAL HIGH (ref 44–121)
BUN/Creatinine Ratio: 11 (ref 9–23)
BUN: 10 mg/dL (ref 6–24)
Bilirubin Total: 0.4 mg/dL (ref 0.0–1.2)
CO2: 23 mmol/L (ref 20–29)
Calcium: 9.8 mg/dL (ref 8.7–10.2)
Chloride: 102 mmol/L (ref 96–106)
Creatinine, Ser: 0.92 mg/dL (ref 0.57–1.00)
GFR calc Af Amer: 83 mL/min/{1.73_m2} (ref >59)
GFR calc non Af Amer: 72 mL/min/{1.73_m2} (ref >59)
Globulin, Total: 2.6 g/dL (ref 1.5–4.5)
Glucose: 101 mg/dL — ABNORMAL HIGH (ref 65–99)
Potassium: 4.1 mmol/L (ref 3.5–5.2)
Sodium: 139 mmol/L (ref 134–144)
Total Protein: 6.9 g/dL (ref 6.0–8.5)

## 2020-01-26 LAB — CBC WITH DIFFERENTIAL/PLATELET
Basophils Absolute: 0.1 10*3/uL (ref 0.0–0.2)
Basos: 1 %
EOS (ABSOLUTE): 0.3 10*3/uL (ref 0.0–0.4)
Eos: 5 %
Hematocrit: 43.4 % (ref 34.0–46.6)
Hemoglobin: 15 g/dL (ref 11.1–15.9)
Immature Grans (Abs): 0 10*3/uL (ref 0.0–0.1)
Immature Granulocytes: 1 %
Lymphocytes Absolute: 0.4 10*3/uL — ABNORMAL LOW (ref 0.7–3.1)
Lymphs: 6 %
MCH: 30.5 pg (ref 26.6–33.0)
MCHC: 34.6 g/dL (ref 31.5–35.7)
MCV: 88 fL (ref 79–97)
Monocytes Absolute: 1 10*3/uL — ABNORMAL HIGH (ref 0.1–0.9)
Monocytes: 14 %
Neutrophils Absolute: 5.3 10*3/uL (ref 1.4–7.0)
Neutrophils: 73 %
Platelets: 383 10*3/uL (ref 150–450)
RBC: 4.92 x10E6/uL (ref 3.77–5.28)
RDW: 12.3 % (ref 11.7–15.4)
WBC: 7.2 10*3/uL (ref 3.4–10.8)

## 2020-01-26 NOTE — Telephone Encounter (Signed)
Pt returned phone call.  

## 2020-01-26 NOTE — Telephone Encounter (Signed)
Received PA request for Nurtec. Key is [id number]. Per CMM, a determination will be made within the next 72 hours.   Will check back later for a response.

## 2020-01-26 NOTE — Telephone Encounter (Signed)
Suzzanne Cloud, NP  Valerie Salts, CMA Labs are stable on Gilenya, abs lymp is 0.4, mildly elevated alk phos   Left message for patient to call back for results.

## 2020-01-27 NOTE — Telephone Encounter (Signed)
Received fax from insurance. PA for Nurtec was approved until 04/14/2021. Will fax a copy of the determination to patient's pharmacy.

## 2020-01-27 NOTE — Telephone Encounter (Signed)
Spoke with pharmacist. She stated that a PA for the Adderall was not needed. Patient was too early for a refill. She will be able to receive the RX this afternoon. She was able to run the Nurtec through the insurance again and it was approved. Nothing further needed at time of call.

## 2020-01-27 NOTE — Telephone Encounter (Signed)
Spoke with patient. She is aware of lab results and verbalized understanding.   While on the phone, she stated that [facility] had not filled the Adderall or Nurtec RXs. I advised her that the Nurtec PA was approved this morning but I have not received a PA for the Adderall. Will call [facility] to see what is needed for the Adderall.   Nothing further needed at time of call.

## 2020-02-28 ENCOUNTER — Other Ambulatory Visit: Payer: Self-pay | Admitting: Neurology

## 2020-02-29 MED ORDER — AMPHETAMINE-DEXTROAMPHETAMINE 10 MG PO TABS: 10.0000 mg | ORAL_TABLET | Freq: Every day | ORAL | 0 refills | Status: DC

## 2020-03-02 DIAGNOSIS — J40 Bronchitis, not specified as acute or chronic: Secondary | ICD-10-CM | POA: Diagnosis not present

## 2020-03-02 DIAGNOSIS — J329 Chronic sinusitis, unspecified: Secondary | ICD-10-CM | POA: Diagnosis not present

## 2020-03-07 DIAGNOSIS — Z79899 Other long term (current) drug therapy: Secondary | ICD-10-CM | POA: Diagnosis not present

## 2020-03-07 DIAGNOSIS — G629 Polyneuropathy, unspecified: Secondary | ICD-10-CM | POA: Diagnosis not present

## 2020-03-07 DIAGNOSIS — G35 Multiple sclerosis: Secondary | ICD-10-CM | POA: Diagnosis not present

## 2020-03-07 DIAGNOSIS — M5412 Radiculopathy, cervical region: Secondary | ICD-10-CM | POA: Diagnosis not present

## 2020-03-29 ENCOUNTER — Other Ambulatory Visit: Payer: Self-pay | Admitting: Neurology

## 2020-03-29 MED ORDER — AMPHETAMINE-DEXTROAMPHETAMINE 10 MG PO TABS: 10.0000 mg | ORAL_TABLET | Freq: Every day | ORAL | 0 refills | Status: DC

## 2020-04-26 ENCOUNTER — Other Ambulatory Visit: Payer: Self-pay | Admitting: Neurology

## 2020-04-27 MED ORDER — AMPHETAMINE-DEXTROAMPHETAMINE 10 MG PO TABS: 10.0000 mg | ORAL_TABLET | Freq: Every day | ORAL | 0 refills | Status: DC

## 2020-05-26 ENCOUNTER — Other Ambulatory Visit: Payer: Self-pay | Admitting: Neurology

## 2020-05-29 MED ORDER — AMPHETAMINE-DEXTROAMPHETAMINE 10 MG PO TABS: 10.0000 mg | ORAL_TABLET | Freq: Every day | ORAL | 0 refills | Status: DC

## 2020-06-03 DIAGNOSIS — J329 Chronic sinusitis, unspecified: Secondary | ICD-10-CM | POA: Diagnosis not present

## 2020-06-08 DIAGNOSIS — G629 Polyneuropathy, unspecified: Secondary | ICD-10-CM | POA: Diagnosis not present

## 2020-06-08 DIAGNOSIS — G35 Multiple sclerosis: Secondary | ICD-10-CM | POA: Diagnosis not present

## 2020-06-08 DIAGNOSIS — M5412 Radiculopathy, cervical region: Secondary | ICD-10-CM | POA: Diagnosis not present

## 2020-06-08 DIAGNOSIS — J449 Chronic obstructive pulmonary disease, unspecified: Secondary | ICD-10-CM | POA: Diagnosis not present

## 2020-06-08 DIAGNOSIS — Z79899 Other long term (current) drug therapy: Secondary | ICD-10-CM | POA: Diagnosis not present

## 2020-06-26 ENCOUNTER — Other Ambulatory Visit: Payer: Self-pay | Admitting: Neurology

## 2020-06-26 MED ORDER — AMPHETAMINE-DEXTROAMPHETAMINE 10 MG PO TABS: 10.0000 mg | ORAL_TABLET | Freq: Every day | ORAL | 0 refills | Status: DC

## 2020-07-25 NOTE — Progress Notes (Signed)
PATIENT: Sue Sanchez DOB: 09-08-1970  REASON FOR VISIT: follow up HISTORY FROM: patient  HISTORY OF PRESENT ILLNESS: Today 07/26/20 Sue Sanchez is a 50 year old female with history of migraine and MS.  She is on Gilenya.  Had COVID in January 2022, symptoms v/d, sore throat, h/a, fever. Since then feels achy, legs are cement, short of breath. Has seen her primary care, wasn't vaccinated. Baseline is right sided weakness, feels this has worsened since COVID.   May trip, using cane more. On Adderall for fatigue. On Vitamin D. No significant migraines, [employer] works Adult nurse. Going to pain management for neck pain, still on hydrocodone, 1st appointment is 4/25.   More stress, family has moved back in. Lexapro helps. Smokes a pack a day. Isn't exercising, doesn't have energy.  Here today for evaluation unaccompanied.  Update 01/25/2020 [name] is a 50 year old female with history of migraine and multiple sclerosis.  She is on Gilenya.  MRI of the brain in May 2021 was overall stable from 2018, minimal MS involvement.  On Adderall for fatigue, is helpful, noted not as beneficial as when first started, but afraid to increase dose. Feels tired all day. On vitamin D supplement.  Headaches, has aura, gets angry, flashing lights, zigzag's in the visual field, photophobia, followed by headache, are variable, could be 1-3 a month, last week, had 6, does not drink enough water. Will take ibuprofen, hydrocodone and lie down. On hydrocodone for chronic pain. On propanolol 40 mg twice a day. Previously tried and failed Relpax and Imitrex. Interested in a newer agent.  Saw Dr. Dione Booze recently, reportedly exam was normal.  Walking is stable, tendency to drag the right foot, has chronic right knee pain, has pinched nerve in her neck and back, was seeing a chiropractor, insurance would not pay, could not afford $50 co-pay. For the neck, seeing [facility], could do injections, needs  surgery, hesitant. 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  Has intermittent urinary retention/urgency. Bowels are doing okay. Had 1 day of left-sided facial pain, resolved. This was present with initial MS presentation.  Lives with her family, boyfriend, walks her dogs, is not very active, considering going to the aquatic center. Smokes. Here today for follow-up unaccompanied.  HISTORY 07/15/2019 Dr. Anne Hahn: Sue Sanchez is a 49 year old right-handed white female with a history of migraine headaches and multiple sclerosis.  The patient has been on Gilenya for a number of years and has tolerated the medication well.  Overall, she has been relatively stable with her MS.  She recently had some problems with neck pain and pain going down the right arm with numbness and tingling into the thumb, index finger, and middle finger.  She is followed through [facility], she had a recent MRI of the cervical spine was told that she had a pinched nerve, she is getting epidural steroid injections.  The patient is still having some discomfort.  She has also seen a chiropractor which has helped some, the patient was having daily headaches prior to the chiropractic treatments.  Fortunately, her headaches have become under good control at this time.  The patient denies any significant changes in her balance, at times she feels that if her legs are weak, she may use a cane for ambulation intermittently.  She denies any visual changes.  If she tilts her head to the right, she can induce pain down the right arm.  She denies any problems controlling bowels but she does have occasional  urinary incontinence.  Her last MRI of the brain was 2 years ago.   REVIEW OF SYSTEMS: Out of a complete 14 system review of symptoms, the patient complains only of the following symptoms, and all other reviewed systems are negative.  Headache  ALLERGIES: No Known Allergies  HOME MEDICATIONS: Outpatient Medications  Prior to Visit  Medication Sig Dispense Refill  . amphetamine-dextroamphetamine (ADDERALL) 10 MG tablet Take 1 tablet (10 mg total) by mouth daily with breakfast. 30 tablet 0  . budesonide-formoterol (SYMBICORT) 160-4.5 MCG/ACT inhaler Inhale 2 puffs into the lungs 2 (two) times daily.    . [facility] D3 25 MCG (1000 UT) capsule TAKE 1 CAPSULE BY MOUTH DAILY. 90 capsule 3  . Cyanocobalamin (B-12 PO) Take 1 tablet by mouth daily.     . cyclobenzaprine (FLEXERIL) 5 MG tablet TAKE 2 TABLETS BY MOUTH AT  BEDTIME AS NEEDED FOR  MUSCLE SPASMS 60 tablet 5  . escitalopram (LEXAPRO) 10 MG tablet Take 1 tablet (10 mg total) by mouth daily. 90 tablet 3  . Esomeprazole Magnesium (NEXIUM PO) Take 1 tablet by mouth 2 (two) times daily.    . Fingolimod HCl (GILENYA) 0.5 MG CAPS Take 1 capsule (0.5 mg total) by mouth daily. 30 capsule 11  . furosemide (LASIX) 20 MG tablet Take 20 mg by mouth daily as needed.    Marland Kitchen HYDROcodone-acetaminophen (NORCO) 7.5-325 MG tablet Take 1 tablet by mouth 3 (three) times daily as needed.    Marland Kitchen ibuprofen (ADVIL,MOTRIN) 200 MG tablet Take 3 tablets (600 mg total) by mouth every 6 (six) hours as needed for headache or moderate pain. 30 tablet 0  . propranolol (INDERAL) 40 MG tablet Take 1 tablet (40 mg total) by mouth 2 (two) times daily. 180 tablet 3  . PROVENTIL HFA 108 (90 BASE) MCG/ACT inhaler Inhale 2 puffs into the lungs every 6 (six) hours as needed for wheezing or shortness of breath.     . Rimegepant Sulfate (NURTEC) 75 MG TBDP Take 75 mg by mouth as needed (Take 1 tablet at headache onset, max is 1 tablet in 24 hours). 12 tablet 11   No facility-administered medications prior to visit.    PAST MEDICAL HISTORY: Past Medical History:  Diagnosis Date  . Abnormality of gait 03/03/2015  . B12 deficiency 08/31/2014  . Chronic fatigue 08/31/2014  . Classic migraine 01/12/2016  . COPD (chronic obstructive pulmonary disease) (HCC)   . Gastroesophageal reflux disease   . Migraine   .  MS (multiple sclerosis) (HCC)   . Numbness    bilateral lower extremity  . Obesity   . Osteoarthritis    diagnosed by orthopedics  . Vitamin D deficiency     PAST SURGICAL HISTORY: Past Surgical History:  Procedure Laterality Date  . CESAREAN SECTION    . COSMETIC SURGERY     lower right leg  . FOOT SURGERY     Club feet  . NASAL SINUS SURGERY     10/23/2016,,  dev septum, other  . TUBAL LIGATION Bilateral     FAMILY HISTORY: Family History  Problem Relation Age of Onset  . Diabetes Mother   . Bipolar disorder Sister   . Bipolar disorder Brother   . Cancer Maternal Grandmother   . COPD Paternal Grandfather   . Heart disease Paternal Grandfather     SOCIAL HISTORY: Social History   Socioeconomic History  . Marital status: Divorced    Spouse name: Not on file  . Number of children: 2  .  Years of education: college  . Highest education level: Not on file  Occupational History  . Occupation: Unemployed  Tobacco Use  . Smoking status: Current Every Day Smoker    Packs/day: 0.50    Years: 30.00    Pack years: 15.00    Types: Cigarettes  . Smokeless tobacco: Never Used  Substance and Sexual Activity  . Alcohol use: No    Alcohol/week: 0.0 standard drinks  . Drug use: No  . Sexual activity: Yes  Other Topics Concern  . Not on file  Social History Narrative   Patient is Divorced with 2 children.   Patient has a college education.   Patient is right handed.   Caffeine Intake: 6 or more sodas daily      Patient has a dog named Butters.   Social Determinants of Health   Financial Resource Strain: Not on file  Food Insecurity: Not on file  Transportation Needs: Not on file  Physical Activity: Not on file  Stress: Not on file  Social Connections: Not on file  Intimate Partner Violence: Not on file   PHYSICAL EXAM  Vitals:   07/26/20 1538  BP: (!) 142/90  Pulse: 74  Weight: 192 lb (87.1 kg)  Height: 5\' 3"  (1.6 m)   Body mass index is 34.01  kg/m.  Generalized: Well developed, in no acute distress   Neurological examination  Mentation: Alert oriented to time, place, history taking. Follows all commands speech and language fluent Cranial nerve II-XII: Pupils were equal round reactive to light. Extraocular movements were full, visual field were full on confrontational test. Facial sensation and strength were normal. Head turning and shoulder shrug were normal and symmetric. Motor: Good strength all extremities, no muscle weakness noted, feels both legs are heavy, strength is strong and symmetric Sensory: Sensory testing is intact to soft touch on all 4 extremities. No evidence of extinction is noted.  Coordination: Cerebellar testing reveals good finger-nose-finger and heel-to-shin bilaterally.  Gait and station: Gait is slightly wide-based, slight limp on the right.  Reflexes: Deep tendon reflexes are symmetric but slightly increased throughout  DIAGNOSTIC DATA (LABS, IMAGING, TESTING) - I reviewed patient records, labs, notes, testing and imaging myself where available.  Lab Results  Component Value Date   WBC 7.2 01/25/2020   HGB 15.0 01/25/2020   HCT 43.4 01/25/2020   MCV 88 01/25/2020   PLT 383 01/25/2020      Component Value Date/Time   NA 139 01/25/2020 1606   K 4.1 01/25/2020 1606   CL 102 01/25/2020 1606   CO2 23 01/25/2020 1606   GLUCOSE 101 (H) 01/25/2020 1606   BUN 10 01/25/2020 1606   CREATININE 0.92 01/25/2020 1606   CALCIUM 9.8 01/25/2020 1606   PROT 6.9 01/25/2020 1606   ALBUMIN 4.3 01/25/2020 1606   AST 17 01/25/2020 1606   ALT 21 01/25/2020 1606   ALKPHOS 140 (H) 01/25/2020 1606   BILITOT 0.4 01/25/2020 1606   GFRNONAA 72 01/25/2020 1606   GFRAA 83 01/25/2020 1606   No results found for: CHOL, HDL, LDLCALC, LDLDIRECT, TRIG, CHOLHDL No results found for: TWSF6C Lab Results  Component Value Date   VITAMINB12 1,130 01/13/2019   No results found for: TSH  ASSESSMENT AND PLAN 50 y.o. year  old female  has a past medical history of Abnormality of gait (03/03/2015), B12 deficiency (08/31/2014), Chronic fatigue (08/31/2014), Classic migraine (01/12/2016), COPD (chronic obstructive pulmonary disease) (HCC), Gastroesophageal reflux disease, Migraine, MS (multiple sclerosis) (HCC), Numbness, Obesity, Osteoarthritis,  and Vitamin D deficiency. here with:  1. Multiple sclerosis, relapsing remitting -Continue on Gilenya -Check routine blood work CBC with differential, CMP -MRI of the brain in May 2021 was overall stable, minimal MS involvement, but will recheck MRI of the brain and cervical spine with and without contrast, given feeling of worsening MS symptoms since COVID in January 2022 -Referral to physical therapy, likely deconditioning playing a role -Looking for a new PCP, encouraged her to establish, does have some lingering pulmonary issues, is a pack-a-day smoker  2. History of migraine headache, with aura -Frequency is variable, but improved -Will decrease propanolol to 20 mg twice daily, see if any impact on fatigue since headaches are well controlled -Continue Nurtec, works wonderfully  3. Neck pain, right arm pain, cervical radiculopathy -Establishing with pain clinic, first appointment 4/25  4. Mild gait disturbance -Referral to physical therapy  5. Chronic fatigue -Continue Adderall 10 mg daily, may increase in future, try back off propanolol first, refill today, drug registry checked last 06/26/20 # 30  6. Depression -Continue Lexapro 10 mg daily -Stress level seems to be better  She will follow-up in 4 to 5 months or sooner if needed needed  Orders Placed This Encounter  Procedures  . MR BRAIN W WO CONTRAST  . MR CERVICAL SPINE W WO CONTRAST  . CBC with Differential/Platelet  . CMP  . Referral to Neuro Rehab   I spent 30 minutes of face-to-face and non-face-to-face time with patient.  This included previsit chart review, lab review, study review, order entry,  electronic health record documentation, patient education.  Margie Ege, AGNP-C, DNP 07/26/2020, 3:47 PM [facility] [address], [address] [place], [place] [address] [phone]

## 2020-07-26 ENCOUNTER — Ambulatory Visit (INDEPENDENT_AMBULATORY_CARE_PROVIDER_SITE_OTHER): Payer: Medicare Other | Admitting: Neurology

## 2020-07-26 ENCOUNTER — Encounter: Payer: Self-pay | Admitting: Neurology

## 2020-07-26 VITALS — BP 142/90 | HR 74 | Ht 63.0 in | Wt 192.0 lb

## 2020-07-26 DIAGNOSIS — G43109 Migraine with aura, not intractable, without status migrainosus: Secondary | ICD-10-CM

## 2020-07-26 DIAGNOSIS — G35 Multiple sclerosis: Secondary | ICD-10-CM

## 2020-07-26 DIAGNOSIS — R5382 Chronic fatigue, unspecified: Secondary | ICD-10-CM

## 2020-07-26 MED ORDER — PROPRANOLOL HCL 20 MG PO TABS: 20.0000 mg | ORAL_TABLET | Freq: Two times a day (BID) | ORAL | 5 refills | Status: DC

## 2020-07-26 MED ORDER — AMPHETAMINE-DEXTROAMPHETAMINE 10 MG PO TABS: 10.0000 mg | ORAL_TABLET | Freq: Every day | ORAL | 0 refills | Status: DC

## 2020-07-26 MED ORDER — GILENYA 0.5 MG PO CAPS: 0.5000 mg | ORAL_CAPSULE | Freq: Every day | ORAL | 11 refills | Status: DC

## 2020-07-26 NOTE — Progress Notes (Signed)
I have read the note, and I agree with the clinical assessment and plan.  Doneta Bayman K Jenai Scaletta   

## 2020-07-26 NOTE — Patient Instructions (Signed)
Check MRIs today  Check labs  Referral to PT Decrease propranolol to 20 mg twice daily, see if less fatigue, monitor headaches Keep other medications same See you back in 4-5 months

## 2020-07-27 ENCOUNTER — Telehealth: Payer: Self-pay | Admitting: Neurology

## 2020-07-27 ENCOUNTER — Telehealth: Payer: Self-pay

## 2020-07-27 LAB — CBC WITH DIFFERENTIAL/PLATELET
Basophils Absolute: 0 10*3/uL (ref 0.0–0.2)
Basos: 1 %
EOS (ABSOLUTE): 0.3 10*3/uL (ref 0.0–0.4)
Eos: 4 %
Hematocrit: 45.6 % (ref 34.0–46.6)
Hemoglobin: 15.4 g/dL (ref 11.1–15.9)
Immature Grans (Abs): 0 10*3/uL (ref 0.0–0.1)
Immature Granulocytes: 0 %
Lymphocytes Absolute: 0.4 10*3/uL — ABNORMAL LOW (ref 0.7–3.1)
Lymphs: 6 %
MCH: 29.4 pg (ref 26.6–33.0)
MCHC: 33.8 g/dL (ref 31.5–35.7)
MCV: 87 fL (ref 79–97)
Monocytes Absolute: 1 10*3/uL — ABNORMAL HIGH (ref 0.1–0.9)
Monocytes: 14 %
Neutrophils Absolute: 5.5 10*3/uL (ref 1.4–7.0)
Neutrophils: 75 %
Platelets: 388 10*3/uL (ref 150–450)
RBC: 5.23 x10E6/uL (ref 3.77–5.28)
RDW: 13.3 % (ref 11.7–15.4)
WBC: 7.4 10*3/uL (ref 3.4–10.8)

## 2020-07-27 LAB — COMPREHENSIVE METABOLIC PANEL
ALT: 26 IU/L (ref 0–32)
AST: 23 IU/L (ref 0–40)
Albumin/Globulin Ratio: 1.9 (ref 1.2–2.2)
Albumin: 4.3 g/dL (ref 3.8–4.9)
Alkaline Phosphatase: 149 IU/L — ABNORMAL HIGH (ref 44–121)
BUN/Creatinine Ratio: 9 (ref 9–23)
BUN: 8 mg/dL (ref 6–24)
Bilirubin Total: 0.3 mg/dL (ref 0.0–1.2)
CO2: 23 mmol/L (ref 20–29)
Calcium: 9.7 mg/dL (ref 8.7–10.2)
Chloride: 102 mmol/L (ref 96–106)
Creatinine, Ser: 0.89 mg/dL (ref 0.57–1.00)
Globulin, Total: 2.3 g/dL (ref 1.5–4.5)
Glucose: 87 mg/dL (ref 65–99)
Potassium: 4.3 mmol/L (ref 3.5–5.2)
Sodium: 139 mmol/L (ref 134–144)
Total Protein: 6.6 g/dL (ref 6.0–8.5)
eGFR: 78 mL/min/{1.73_m2} (ref >59)

## 2020-07-27 NOTE — Telephone Encounter (Signed)
[employer] medicare/medicaid order sent to GI. No auth they will reach out to the patient to schedule.  °

## 2020-07-27 NOTE — Telephone Encounter (Signed)
-----   Message from Glean Salvo, NP sent at 07/27/2020  6:01 AM EDT ----- Sent my chart message:

## 2020-08-04 ENCOUNTER — Other Ambulatory Visit: Payer: Self-pay

## 2020-08-04 ENCOUNTER — Ambulatory Visit
Admission: RE | Admit: 2020-08-04 | Discharge: 2020-08-04 | Disposition: A | Discharge status: Home / Self Care | Payer: Medicare Other | Source: Ambulatory Visit | Attending: Neurology | Admitting: Neurology

## 2020-08-04 DIAGNOSIS — G35 Multiple sclerosis: Secondary | ICD-10-CM

## 2020-08-04 MED ORDER — GADOBENATE DIMEGLUMINE 529 MG/ML IV SOLN
19.0000 mL | Freq: Once | INTRAVENOUS | Status: AC | PRN
  Administered 2020-08-04: 19 mL via INTRAVENOUS

## 2020-08-07 ENCOUNTER — Telehealth: Payer: Self-pay

## 2020-08-07 DIAGNOSIS — Z79899 Other long term (current) drug therapy: Secondary | ICD-10-CM | POA: Diagnosis not present

## 2020-08-07 DIAGNOSIS — G43109 Migraine with aura, not intractable, without status migrainosus: Secondary | ICD-10-CM | POA: Diagnosis not present

## 2020-08-07 DIAGNOSIS — Z79891 Long term (current) use of opiate analgesic: Secondary | ICD-10-CM | POA: Diagnosis not present

## 2020-08-07 DIAGNOSIS — G894 Chronic pain syndrome: Secondary | ICD-10-CM | POA: Diagnosis not present

## 2020-08-07 DIAGNOSIS — M47812 Spondylosis without myelopathy or radiculopathy, cervical region: Secondary | ICD-10-CM | POA: Diagnosis not present

## 2020-08-07 DIAGNOSIS — R200 Anesthesia of skin: Secondary | ICD-10-CM | POA: Diagnosis not present

## 2020-08-07 NOTE — Telephone Encounter (Signed)
-----   Message from Glean Salvo, NP sent at 08/07/2020  8:07 AM EDT ----- Please call the patient, MRI of the brain was overall stable, few stable plaques are seen, none reported new. MRI cervical spine to follow.. IMPRESSION:   MRI brain (with and without) demonstrating: - Few stable periventricular and subcortical foci of chronic demyelinating plaques. - No acute plaques.

## 2020-08-07 NOTE — Telephone Encounter (Signed)
Pt verified by name and DOB,  normal results given per provider, pt voiced understanding all question answered. °

## 2020-08-08 ENCOUNTER — Telehealth: Payer: Self-pay

## 2020-08-08 NOTE — Telephone Encounter (Signed)
-----   Message from Glean Salvo, NP sent at 08/07/2020  8:09 AM EDT ----- MRI cervical spine showed moderate narrowing at C5-6 with disc bulging but no cord signal abnormalities.  Overall, no significant abnormalities.  IMPRESSION:   MRI cervical spine (with and without) demonstrating: - At C5-6: disc bulging with moderate biforaminal stenosis. - At C6-7: disc bulging with mild biforaminal stenosis. - No cord signal abnormalities.

## 2020-08-08 NOTE — Telephone Encounter (Signed)
Pt verified by name and DOB, l results given per provider, pt voiced understanding all question answered. 

## 2020-08-16 DIAGNOSIS — M47812 Spondylosis without myelopathy or radiculopathy, cervical region: Secondary | ICD-10-CM | POA: Diagnosis not present

## 2020-08-16 DIAGNOSIS — M542 Cervicalgia: Secondary | ICD-10-CM | POA: Diagnosis not present

## 2020-08-16 DIAGNOSIS — G894 Chronic pain syndrome: Secondary | ICD-10-CM | POA: Diagnosis not present

## 2020-08-16 DIAGNOSIS — R200 Anesthesia of skin: Secondary | ICD-10-CM | POA: Diagnosis not present

## 2020-08-18 ENCOUNTER — Other Ambulatory Visit: Payer: Self-pay

## 2020-08-18 ENCOUNTER — Ambulatory Visit
Admission: RE | Admit: 2020-08-18 | Discharge: 2020-08-18 | Disposition: A | Discharge status: Home / Self Care | Payer: Medicare Other | Source: Ambulatory Visit | Attending: Pain Medicine | Admitting: Pain Medicine

## 2020-08-18 ENCOUNTER — Other Ambulatory Visit: Payer: Self-pay | Admitting: Pain Medicine

## 2020-08-18 DIAGNOSIS — G8929 Other chronic pain: Secondary | ICD-10-CM | POA: Diagnosis not present

## 2020-08-18 DIAGNOSIS — M25561 Pain in right knee: Secondary | ICD-10-CM

## 2020-08-18 DIAGNOSIS — M25461 Effusion, right knee: Secondary | ICD-10-CM | POA: Diagnosis not present

## 2020-08-18 DIAGNOSIS — M1711 Unilateral primary osteoarthritis, right knee: Secondary | ICD-10-CM | POA: Diagnosis not present

## 2020-08-21 DIAGNOSIS — M47812 Spondylosis without myelopathy or radiculopathy, cervical region: Secondary | ICD-10-CM | POA: Diagnosis not present

## 2020-08-22 ENCOUNTER — Encounter: Payer: Self-pay | Admitting: Neurology

## 2020-08-28 ENCOUNTER — Other Ambulatory Visit: Payer: Self-pay | Admitting: Neurology

## 2020-08-28 DIAGNOSIS — M1711 Unilateral primary osteoarthritis, right knee: Secondary | ICD-10-CM | POA: Diagnosis not present

## 2020-08-29 MED ORDER — AMPHETAMINE-DEXTROAMPHETAMINE 10 MG PO TABS: 10.0000 mg | ORAL_TABLET | Freq: Every day | ORAL | 0 refills | Status: DC

## 2020-09-05 ENCOUNTER — Telehealth: Payer: Self-pay | Admitting: Neurology

## 2020-09-05 DIAGNOSIS — G35 Multiple sclerosis: Secondary | ICD-10-CM

## 2020-09-05 NOTE — Telephone Encounter (Signed)
New order placed to her requested PT.

## 2020-09-05 NOTE — Telephone Encounter (Signed)
Pt called, requesting if my physical therapy could be sent to [facility] instead of [facility]. Would like a call from the nurse.

## 2020-09-06 ENCOUNTER — Ambulatory Visit: Payer: Medicare Other | Admitting: Physical Therapy

## 2020-09-07 DIAGNOSIS — M546 Pain in thoracic spine: Secondary | ICD-10-CM | POA: Diagnosis not present

## 2020-09-07 DIAGNOSIS — M5412 Radiculopathy, cervical region: Secondary | ICD-10-CM | POA: Diagnosis not present

## 2020-09-07 DIAGNOSIS — M532X2 Spinal instabilities, cervical region: Secondary | ICD-10-CM | POA: Diagnosis not present

## 2020-09-08 ENCOUNTER — Encounter: Payer: Self-pay | Admitting: Neurology

## 2020-09-13 DIAGNOSIS — M47812 Spondylosis without myelopathy or radiculopathy, cervical region: Secondary | ICD-10-CM | POA: Diagnosis not present

## 2020-09-13 DIAGNOSIS — R200 Anesthesia of skin: Secondary | ICD-10-CM | POA: Diagnosis not present

## 2020-09-13 DIAGNOSIS — G894 Chronic pain syndrome: Secondary | ICD-10-CM | POA: Diagnosis not present

## 2020-09-13 DIAGNOSIS — M1711 Unilateral primary osteoarthritis, right knee: Secondary | ICD-10-CM | POA: Diagnosis not present

## 2020-09-24 ENCOUNTER — Other Ambulatory Visit: Payer: Self-pay | Admitting: Neurology

## 2020-09-25 MED ORDER — AMPHETAMINE-DEXTROAMPHETAMINE 10 MG PO TABS: 10.0000 mg | ORAL_TABLET | Freq: Every day | ORAL | 0 refills | Status: DC

## 2020-10-26 ENCOUNTER — Other Ambulatory Visit: Payer: Self-pay | Admitting: Neurology

## 2020-10-26 MED ORDER — AMPHETAMINE-DEXTROAMPHETAMINE 10 MG PO TABS: 10.0000 mg | ORAL_TABLET | Freq: Every day | ORAL | 0 refills | Status: DC

## 2020-11-23 ENCOUNTER — Other Ambulatory Visit: Payer: Self-pay | Admitting: Neurology

## 2020-11-23 NOTE — Telephone Encounter (Signed)
Pt has called for a refill on her amphetamine-dextroamphetamine (ADDERALL) 10 MG tablet [id number] [place], [place]

## 2020-11-26 DIAGNOSIS — R519 Headache, unspecified: Secondary | ICD-10-CM | POA: Diagnosis not present

## 2020-11-26 DIAGNOSIS — J209 Acute bronchitis, unspecified: Secondary | ICD-10-CM | POA: Diagnosis not present

## 2020-11-27 ENCOUNTER — Telehealth: Payer: Self-pay | Admitting: Emergency Medicine

## 2020-11-27 MED ORDER — AMPHETAMINE-DEXTROAMPHETAMINE 10 MG PO TABS: 10.0000 mg | ORAL_TABLET | Freq: Every day | ORAL | 0 refills | Status: DC

## 2020-11-27 NOTE — Telephone Encounter (Signed)
Called patient to let her know [facility] [place] [place]   [facility].  Patient denied further questions, verbalized understanding and expressed appreciation for the phone call.

## 2020-11-27 NOTE — Telephone Encounter (Signed)
Pt is asking for a call with the status of her amphetamine-dextroamphetamine (ADDERALL) 10 MG tablet being called [id number] [place], [place]

## 2020-11-27 NOTE — Telephone Encounter (Signed)
-----   Message from York Spaniel, MD sent at 11/27/2020 10:55 AM EDT ----- The Adderall prescription was sent in, apparently last week was sent to the wrong pharmacy.

## 2020-11-30 ENCOUNTER — Other Ambulatory Visit: Payer: Self-pay | Admitting: Neurology

## 2020-12-13 ENCOUNTER — Other Ambulatory Visit: Payer: Self-pay

## 2020-12-13 ENCOUNTER — Ambulatory Visit (INDEPENDENT_AMBULATORY_CARE_PROVIDER_SITE_OTHER): Payer: Medicare Other | Admitting: Neurology

## 2020-12-13 ENCOUNTER — Encounter: Payer: Self-pay | Admitting: Neurology

## 2020-12-13 VITALS — BP 161/84 | HR 73 | Ht 63.0 in | Wt 199.0 lb

## 2020-12-13 DIAGNOSIS — G43109 Migraine with aura, not intractable, without status migrainosus: Secondary | ICD-10-CM | POA: Diagnosis not present

## 2020-12-13 DIAGNOSIS — R5382 Chronic fatigue, unspecified: Secondary | ICD-10-CM

## 2020-12-13 DIAGNOSIS — R269 Unspecified abnormalities of gait and mobility: Secondary | ICD-10-CM

## 2020-12-13 DIAGNOSIS — G35 Multiple sclerosis: Secondary | ICD-10-CM | POA: Diagnosis not present

## 2020-12-13 DIAGNOSIS — Z5181 Encounter for therapeutic drug level monitoring: Secondary | ICD-10-CM | POA: Diagnosis not present

## 2020-12-13 NOTE — Progress Notes (Signed)
Reason for visit: Multiple sclerosis, migraine headache, chronic neck and right leg pain  Sue Sanchez is an 50 y.o. female  History of present illness:  Sue Sanchez is a 50 year old right-handed white female with a history of multiple sclerosis.  The patient has some issues with chronic fatigue that was worsened by COVID infection in January 2022.  She is on Gilenya for her MS, she recently had MRI of the brain and cervical spine showing minimal white matter changes in the brain and no alteration of the spinal cord.  She has chronic neck pain and was being managed through a pain center in the past but is no longer.  She does have chronic pain in the left hip, leg and foot.  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  The patient is on propranolol 20 mg twice daily.  She indicates that her headaches are relatively infrequent and may occur only 2 or 3 times a month, Nurtec seems to help the headache when she gets one.  She reports no new symptoms with MS such as visual changes, numbness or weakness of the face, arms, legs, or any changes in balance.  She is followed through an ophthalmologist, Dr. Dione Booze.  The patient continues to smoke a pack of cigarettes daily.  Past Medical History:  Diagnosis Date   Abnormality of gait 03/03/2015   B12 deficiency 08/31/2014   Chronic fatigue 08/31/2014   Classic migraine 01/12/2016   COPD (chronic obstructive pulmonary disease) (HCC)    Gastroesophageal reflux disease    Migraine    MS (multiple sclerosis) (HCC)    Numbness    bilateral lower extremity   Obesity    Osteoarthritis    diagnosed by orthopedics   Vitamin D deficiency     Past Surgical History:  Procedure Laterality Date   CESAREAN SECTION     COSMETIC SURGERY     lower right leg   FOOT SURGERY     Club feet   NASAL SINUS SURGERY     10/23/2016,,  dev septum, other   TUBAL LIGATION Bilateral     Family History   Problem Relation Age of Onset   Diabetes Mother    Bipolar disorder Sister    Bipolar disorder Brother    Cancer Maternal Grandmother    COPD Paternal Grandfather    Heart disease Paternal Grandfather     Social history:  reports that she has been smoking cigarettes. She has a 15.00 pack-year smoking history. She has never used smokeless tobacco. She reports that she does not drink alcohol and does not use drugs.   No Known Allergies  Medications:  Prior to Admission medications   Medication Sig Start Date End Date Taking? Authorizing Provider  amphetamine-dextroamphetamine (ADDERALL) 10 MG tablet Take 1 tablet (10 mg total) by mouth daily with breakfast. 11/27/20  Yes York Spaniel, MD  budesonide-formoterol [facility]) 160-4.5 MCG/ACT inhaler Inhale 2 puffs into the lungs 2 (two) times daily.   Yes [provider]  [facility] D3 25 MCG (1000 UT) capsule TAKE 1 CAPSULE BY MOUTH DAILY. 10/15/18  Yes Glean Salvo, NP  Cyanocobalamin (B-12 PO) Take 1 tablet by mouth daily.    Yes [provider]  cyclobenzaprine (FLEXERIL) 5 MG tablet TAKE 2 TABLETS BY MOUTH AT  BEDTIME AS NEEDED FOR  MUSCLE SPASMS 01/25/20  Yes Glean Salvo, NP  escitalopram (LEXAPRO) 10 MG tablet Take  1 tablet (10 mg total) by mouth daily. 12/01/20  Yes Glean Salvo, NP  Esomeprazole Magnesium (NEXIUM PO) Take 1 tablet by mouth 2 (two) times daily.   Yes [provider]  Fingolimod HCl (GILENYA) 0.5 MG CAPS Take 1 capsule (0.5 mg total) by mouth daily. 07/26/20  Yes Glean Salvo, NP  ibuprofen (ADVIL,MOTRIN) 200 MG tablet Take 3 tablets (600 mg total) by mouth every 6 (six) hours as needed for headache or moderate pain. 04/24/15  Yes Loren Racer, MD  propranolol (INDERAL) 20 MG tablet Take 1 tablet (20 mg total) by mouth 2 (two) times daily. 07/26/20  Yes Glean Salvo, NP  PROVENTIL HFA 108 (90 BASE) MCG/ACT inhaler Inhale 2 puffs into the lungs every 6 (six) hours as needed for wheezing or  shortness of breath.  12/28/12  Yes [provider]  Rimegepant Sulfate (NURTEC) 75 MG TBDP Take 75 mg by mouth as needed (Take 1 tablet at headache onset, max is 1 tablet in 24 hours). 01/25/20  Yes Glean Salvo, NP    ROS:  Out of a complete 14 system review of symptoms, the patient complains only of the following symptoms, and all other reviewed systems are negative.  Fatigue Right leg pain Neck pain  Blood pressure (!) 161/84, pulse 73, height 5\' 3"  (1.6 m), weight 199 lb (90.3 kg).  Physical Exam  General: The patient is alert and cooperative at the time of the examination.  The patient is moderately obese.  Skin: 1+ edema in the lower extremities seen bilaterally, slightly worse on the right.   Neurologic Exam  Mental status: The patient is alert and oriented x 3 at the time of the examination. The patient has apparent normal recent and remote memory, with an apparently normal attention span and concentration ability.   Cranial nerves: Facial symmetry is present. Speech is normal, no aphasia or dysarthria is noted. Extraocular movements are full. Visual fields are full.  Pupils are equal, round, and reactive to light.  Discs are flat bilaterally.  Motor: The patient has good strength in all 4 extremities.  Sensory examination: Soft touch sensation is symmetric on the face, arms, and legs.  Coordination: The patient has good finger-nose-finger and heel-to-shin bilaterally.  Gait and station: The patient has a limping type gait on the right leg.  Tandem gait is slightly unsteady.  Romberg is negative.  Reflexes: Deep tendon reflexes are symmetric, with exception that there is depression of the right ankle jerk reflex, 2-3 beats of ankle clonus on the left.   MRI brain 08/06/20:  IMPRESSION:    MRI brain (with and without) demonstrating: - Few stable periventricular and subcortical foci of chronic demyelinating plaques. - No acute plaques  * MRI scan images  were reviewed online. I agree with the written report.   MRI cervical 08/06/20:  IMPRESSION:    MRI cervical spine (with and without) demonstrating: - At C5-6: disc bulging with moderate biforaminal stenosis. - At C6-7: disc bulging with mild biforaminal stenosis. - No cord signal abnormalities.    Assessment/Plan:  1.  Multiple sclerosis  2.  Migraine headache  The patient will go to half a tablet twice daily the propranolol for 2 weeks and then stop the medication.  She will contact me if the headaches worsen.  She is on low-dose Adderall for fatigue, in the future we may consider switching over to Provigil instead to treat her chronic fatigue.  The patient is urged to quit smoking.  She will continue the Gilenya therapy.  We will check blood work today.  She will follow-up here in 6 months, in the future she can be followed through Dr. Epimenio Foot.  Marlan Palau MD 12/13/2020 3:25 PM  [facility] [address] [place], [place] [address]  Phone [phone] Fax [phone]

## 2020-12-13 NOTE — Patient Instructions (Signed)
Reduce the propranolol to 1/2 tablet twice a day for 2 weeks, then stop the medication.

## 2020-12-14 LAB — COMPREHENSIVE METABOLIC PANEL
ALT: 12 IU/L (ref 0–32)
AST: 16 IU/L (ref 0–40)
Albumin/Globulin Ratio: 1.5 (ref 1.2–2.2)
Albumin: 4.1 g/dL (ref 3.8–4.9)
Alkaline Phosphatase: 137 IU/L — ABNORMAL HIGH (ref 44–121)
BUN/Creatinine Ratio: 15 (ref 9–23)
BUN: 12 mg/dL (ref 6–24)
Bilirubin Total: 0.2 mg/dL (ref 0.0–1.2)
CO2: 22 mmol/L (ref 20–29)
Calcium: 9.4 mg/dL (ref 8.7–10.2)
Chloride: 101 mmol/L (ref 96–106)
Creatinine, Ser: 0.82 mg/dL (ref 0.57–1.00)
Globulin, Total: 2.7 g/dL (ref 1.5–4.5)
Glucose: 86 mg/dL (ref 65–99)
Potassium: 4.4 mmol/L (ref 3.5–5.2)
Sodium: 139 mmol/L (ref 134–144)
Total Protein: 6.8 g/dL (ref 6.0–8.5)
eGFR: 86 mL/min/{1.73_m2} (ref >59)

## 2020-12-14 LAB — CBC WITH DIFFERENTIAL/PLATELET
Basophils Absolute: 0 10*3/uL (ref 0.0–0.2)
Basos: 1 %
EOS (ABSOLUTE): 0.3 10*3/uL (ref 0.0–0.4)
Eos: 5 %
Hematocrit: 42.5 % (ref 34.0–46.6)
Hemoglobin: 15 g/dL (ref 11.1–15.9)
Immature Grans (Abs): 0 10*3/uL (ref 0.0–0.1)
Immature Granulocytes: 1 %
Lymphocytes Absolute: 0.4 10*3/uL — ABNORMAL LOW (ref 0.7–3.1)
Lymphs: 6 %
MCH: 30.9 pg (ref 26.6–33.0)
MCHC: 35.3 g/dL (ref 31.5–35.7)
MCV: 87 fL (ref 79–97)
Monocytes Absolute: 0.9 10*3/uL (ref 0.1–0.9)
Monocytes: 14 %
Neutrophils Absolute: 5.2 10*3/uL (ref 1.4–7.0)
Neutrophils: 73 %
Platelets: 380 10*3/uL (ref 150–450)
RBC: 4.86 x10E6/uL (ref 3.77–5.28)
RDW: 12.5 % (ref 11.7–15.4)
WBC: 6.9 10*3/uL (ref 3.4–10.8)

## 2020-12-26 ENCOUNTER — Other Ambulatory Visit: Payer: Self-pay

## 2020-12-26 MED ORDER — AMPHETAMINE-DEXTROAMPHETAMINE 10 MG PO TABS: 10.0000 mg | ORAL_TABLET | Freq: Every day | ORAL | 0 refills | Status: DC

## 2021-01-26 ENCOUNTER — Other Ambulatory Visit: Payer: Self-pay

## 2021-01-26 MED ORDER — AMPHETAMINE-DEXTROAMPHETAMINE 10 MG PO TABS: 10.0000 mg | ORAL_TABLET | Freq: Every day | ORAL | 0 refills | Status: DC

## 2021-02-08 DIAGNOSIS — H04123 Dry eye syndrome of bilateral lacrimal glands: Secondary | ICD-10-CM | POA: Diagnosis not present

## 2021-02-08 DIAGNOSIS — Z79899 Other long term (current) drug therapy: Secondary | ICD-10-CM | POA: Diagnosis not present

## 2021-02-08 DIAGNOSIS — H2513 Age-related nuclear cataract, bilateral: Secondary | ICD-10-CM | POA: Diagnosis not present

## 2021-02-08 DIAGNOSIS — G35 Multiple sclerosis: Secondary | ICD-10-CM | POA: Diagnosis not present

## 2021-02-13 ENCOUNTER — Other Ambulatory Visit: Payer: Self-pay | Admitting: Neurology

## 2021-02-26 ENCOUNTER — Other Ambulatory Visit: Payer: Self-pay | Admitting: Neurology

## 2021-02-26 ENCOUNTER — Other Ambulatory Visit: Payer: Self-pay

## 2021-02-26 MED ORDER — AMPHETAMINE-DEXTROAMPHETAMINE 10 MG PO TABS: 10.0000 mg | ORAL_TABLET | Freq: Every day | ORAL | 0 refills | Status: DC

## 2021-02-26 NOTE — Telephone Encounter (Signed)
I am not WID.

## 2021-02-26 NOTE — Telephone Encounter (Signed)
Pt received notification that her request was denied by another provider, pt would like to know if she is able to get her Pt is asking for a refill on her amphetamine-dextroamphetamine (ADDERALL) 10 MG tablet [facility]  [id number] [place], [place]

## 2021-02-26 NOTE — Telephone Encounter (Signed)
Per Dr. Anne Hahn' last note, this patient will be transitioning care to Dr. Epimenio Foot. [place] narcotic registry checked. Last rx for generic Adderall filled on 01/26/21 for #30 x 0. Patient has a pending appt w/ Sarah on 06/12/2021.

## 2021-03-28 ENCOUNTER — Other Ambulatory Visit: Payer: Self-pay | Admitting: Neurology

## 2021-03-28 MED ORDER — AMPHETAMINE-DEXTROAMPHETAMINE 10 MG PO TABS: 10.0000 mg | ORAL_TABLET | Freq: Every day | ORAL | 0 refills | Status: DC

## 2021-04-07 DIAGNOSIS — R051 Acute cough: Secondary | ICD-10-CM | POA: Diagnosis not present

## 2021-04-07 DIAGNOSIS — J029 Acute pharyngitis, unspecified: Secondary | ICD-10-CM | POA: Diagnosis not present

## 2021-04-07 DIAGNOSIS — R509 Fever, unspecified: Secondary | ICD-10-CM | POA: Diagnosis not present

## 2021-04-11 ENCOUNTER — Telehealth: Payer: Self-pay | Admitting: *Deleted

## 2021-04-11 NOTE — Telephone Encounter (Signed)
[id number] approved through 04/14/2022.

## 2021-04-11 NOTE — Telephone Encounter (Signed)
PA for Nurtec 75mg  started on covermymeds (key ). Pt has coverage through [employer] : [id number]). Decision pending.

## 2021-04-27 ENCOUNTER — Other Ambulatory Visit: Payer: Self-pay | Admitting: Neurology

## 2021-04-27 MED ORDER — AMPHETAMINE-DEXTROAMPHETAMINE 10 MG PO TABS
10.0000 mg | ORAL_TABLET | Freq: Every day | ORAL | 0 refills | Status: DC
Start: 2021-04-27 — End: 2021-05-29

## 2021-05-29 ENCOUNTER — Other Ambulatory Visit: Payer: Self-pay | Admitting: Neurology

## 2021-05-29 MED ORDER — AMPHETAMINE-DEXTROAMPHETAMINE 10 MG PO TABS: 10.0000 mg | ORAL_TABLET | Freq: Every day | ORAL | 0 refills | Status: DC

## 2021-05-29 NOTE — Telephone Encounter (Signed)
Received refill request for Adderall 10mg .  Last OV was on 12/13/20.  Next OV is scheduled for 06/12/21 .  Last RX was written on 04/27/21 for 30 tabs.   [place] Drug Database has been reviewed.

## 2021-06-06 DIAGNOSIS — G35 Multiple sclerosis: Secondary | ICD-10-CM | POA: Diagnosis not present

## 2021-06-06 DIAGNOSIS — J449 Chronic obstructive pulmonary disease, unspecified: Secondary | ICD-10-CM | POA: Diagnosis not present

## 2021-06-06 DIAGNOSIS — E538 Deficiency of other specified B group vitamins: Secondary | ICD-10-CM | POA: Diagnosis not present

## 2021-06-06 DIAGNOSIS — Z1159 Encounter for screening for other viral diseases: Secondary | ICD-10-CM | POA: Diagnosis not present

## 2021-06-06 DIAGNOSIS — E7800 Pure hypercholesterolemia, unspecified: Secondary | ICD-10-CM | POA: Diagnosis not present

## 2021-06-06 DIAGNOSIS — Z1231 Encounter for screening mammogram for malignant neoplasm of breast: Secondary | ICD-10-CM | POA: Diagnosis not present

## 2021-06-06 DIAGNOSIS — E559 Vitamin D deficiency, unspecified: Secondary | ICD-10-CM | POA: Diagnosis not present

## 2021-06-06 DIAGNOSIS — Z0000 Encounter for general adult medical examination without abnormal findings: Secondary | ICD-10-CM | POA: Diagnosis not present

## 2021-06-06 DIAGNOSIS — Z87891 Personal history of nicotine dependence: Secondary | ICD-10-CM | POA: Diagnosis not present

## 2021-06-07 ENCOUNTER — Other Ambulatory Visit: Payer: Self-pay | Admitting: Family Medicine

## 2021-06-07 DIAGNOSIS — Z87891 Personal history of nicotine dependence: Secondary | ICD-10-CM

## 2021-06-11 NOTE — Progress Notes (Addendum)
PATIENT: Sue Sanchez DOB: [birth year]  REASON FOR VISIT: Follow up for MS HISTORY FROM: Patient, alone PRIMARY NEUROLOGIST: Dr. Epimenio Foot   HISTORY OF PRESENT ILLNESS: Today 06/12/21 Sue Sanchez here today for follow-up with history of MS on Gilenya.  MRI brain and cervical spine imaging in April 2022 were stable, no new lesions. Vitamin D was low 22.8 from PCP, on supplement. B 12 was 162 low, is no on replacement now for 1 week. Has been having spasms in her back, is out of flexeril. Adderall helps with fatigue for about 5 hours. Off propranolol, headaches have been under good control, slight headache few days a week, takes Ibuprofen, uses Nurtec 1-2 times a month. Daughter moved out, a lot less stress. She lives with significant other and son. No exacerbation of MS symptoms. Is trying to quit smoking. Has chronic fatigue. Will get random spasms. Sees Dr. Dione Booze. Has urinary urgency.   HISTORY  12/13/2020 Dr. Anne Hahn: Ms. Justin is a 51 year old right-handed white female with a history of multiple sclerosis.  The patient has some issues with chronic fatigue that was worsened by COVID infection in January 2022.  She is on Gilenya for her MS, she recently had MRI of the brain and cervical spine showing minimal white matter changes in the brain and no alteration of the spinal cord.  She has chronic neck pain and was being managed through a pain center in the past but is no longer.  She does have chronic pain in the left hip, leg and foot.  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  The patient is on propranolol 20 mg twice daily.  She indicates that her headaches are relatively infrequent and may occur only 2 or 3 times a month, Nurtec seems to help the headache when she gets one.  She reports no new symptoms with MS such as visual changes, numbness or weakness of the face, arms, legs, or any changes in balance.  She is followed through an  ophthalmologist, Dr. Dione Booze.  The patient continues to smoke a pack of cigarettes daily.   REVIEW OF SYSTEMS: Out of a complete 14 system review of symptoms, the patient complains only of the following symptoms, and all other reviewed systems are negative.   See HPI  ALLERGIES: No Known Allergies  HOME MEDICATIONS: Outpatient Medications Prior to Visit  Medication Sig Dispense Refill   amphetamine-dextroamphetamine (ADDERALL) 10 MG tablet Take 1 tablet (10 mg total) by mouth daily with breakfast. 30 tablet 0   budesonide-formoterol (SYMBICORT) 160-4.5 MCG/ACT inhaler Inhale 2 puffs into the lungs 2 (two) times daily.     [facility] D3 25 MCG (1000 UT) capsule TAKE 1 CAPSULE BY MOUTH DAILY. 90 capsule 3   Cyanocobalamin (B-12 PO) Take 1 tablet by mouth daily.      cyclobenzaprine (FLEXERIL) 5 MG tablet TAKE 2 TABLETS BY MOUTH AT  BEDTIME AS NEEDED FOR  MUSCLE SPASMS 60 tablet 5   escitalopram (LEXAPRO) 10 MG tablet TAKE 1 TABLET BY MOUTH  DAILY 90 tablet 3   Esomeprazole Magnesium (NEXIUM PO) Take 1 tablet by mouth 2 (two) times daily.     Fingolimod HCl (GILENYA) 0.5 MG CAPS Take 1 capsule (0.5 mg total) by mouth daily. 30 capsule 11   ibuprofen (ADVIL,MOTRIN) 200 MG tablet Take 3 tablets (600 mg total) by mouth every 6 (six) hours as needed for headache or moderate pain. 30 tablet 0  propranolol (INDERAL) 20 MG tablet Take 1 tablet (20 mg total) by mouth 2 (two) times daily. 60 tablet 5   PROVENTIL HFA 108 (90 BASE) MCG/ACT inhaler Inhale 2 puffs into the lungs every 6 (six) hours as needed for wheezing or shortness of breath.      Rimegepant Sulfate (NURTEC) 75 MG TBDP Take 75 mg by mouth as needed (Take 1 tablet at headache onset, max is 1 tablet in 24 hours). 12 tablet 11   rosuvastatin (CRESTOR) 10 MG tablet Take 10 mg by mouth at bedtime.     No facility-administered medications prior to visit.    PAST MEDICAL HISTORY: Past Medical History:  Diagnosis Date   Abnormality of gait  03/03/2015   B12 deficiency 08/31/2014   Chronic fatigue 08/31/2014   Classic migraine 01/12/2016   COPD (chronic obstructive pulmonary disease) (HCC)    Gastroesophageal reflux disease    Migraine    MS (multiple sclerosis) (HCC)    Numbness    bilateral lower extremity   Obesity    Osteoarthritis    diagnosed by orthopedics   Vitamin D deficiency     PAST SURGICAL HISTORY: Past Surgical History:  Procedure Laterality Date   CESAREAN SECTION     COSMETIC SURGERY     lower right leg   FOOT SURGERY     Club feet   NASAL SINUS SURGERY     10/23/2016,,  dev septum, other   TUBAL LIGATION Bilateral     FAMILY HISTORY: Family History  Problem Relation Age of Onset   Diabetes Mother    Bipolar disorder Sister    Bipolar disorder Brother    Cancer Maternal Grandmother    COPD Paternal Grandfather    Heart disease Paternal Grandfather     SOCIAL HISTORY: Social History   Socioeconomic History   Marital status: Divorced    Spouse name: Not on file   Number of children: 2   Years of education: college   Highest education level: Not on file  Occupational History   Occupation: Unemployed  Tobacco Use   Smoking status: Every Day    Packs/day: 0.50    Years: 30.00    Pack years: 15.00    Types: Cigarettes   Smokeless tobacco: Never  Substance and Sexual Activity   Alcohol use: No    Alcohol/week: 0.0 standard drinks   Drug use: No   Sexual activity: Yes  Other Topics Concern   Not on file  Social History Narrative   Patient is Divorced with 2 children.   Patient has a college education.   Patient is right handed.   Caffeine Intake: 6 or more sodas daily      Patient has a dog named Butters.   Social Determinants of Health   Financial Resource Strain: Not on file  Food Insecurity: Not on file  Transportation Needs: Not on file  Physical Activity: Not on file  Stress: Not on file  Social Connections: Not on file  Intimate Partner Violence: Not on file    PHYSICAL EXAM  Vitals:   06/12/21 1446  BP: (!) 159/86  Pulse: 93  Weight: 199 lb (90.3 kg)  Height: 5\' 3"  (1.6 m)   Body mass index is 35.25 kg/m.  Generalized: Well developed, in no acute distress   Neurological examination  Mentation: Alert oriented to time, place, history taking. Follows all commands speech and language fluent Cranial nerve II-XII: Pupils were equal round reactive to light. Extraocular movements were full, visual  field were full on confrontational test. Facial sensation and strength were normal. Head turning and shoulder shrug  were normal and symmetric. Motor: Good strength to all extremities, right leg 4/5 hip flexion Sensory: Sensory testing is intact to soft touch on all 4 extremities. No evidence of extinction is noted.  Coordination: Cerebellar testing reveals good finger-nose-finger and heel-to-shin bilaterally.  Gait and station: Limping gait on the right, uses cane for ambulation. Has birth deformity to right foot. Reflexes: Deep tendon reflexes are symmetric .   DIAGNOSTIC DATA (LABS, IMAGING, TESTING) - I reviewed patient records, labs, notes, testing and imaging myself where available.  Lab Results  Component Value Date   WBC 6.9 12/13/2020   HGB 15.0 12/13/2020   HCT 42.5 12/13/2020   MCV 87 12/13/2020   PLT 380 12/13/2020      Component Value Date/Time   NA 139 12/13/2020 1535   K 4.4 12/13/2020 1535   CL 101 12/13/2020 1535   CO2 22 12/13/2020 1535   GLUCOSE 86 12/13/2020 1535   BUN 12 12/13/2020 1535   CREATININE 0.82 12/13/2020 1535   CALCIUM 9.4 12/13/2020 1535   PROT 6.8 12/13/2020 1535   ALBUMIN 4.1 12/13/2020 1535   AST 16 12/13/2020 1535   ALT 12 12/13/2020 1535   ALKPHOS 137 (H) 12/13/2020 1535   BILITOT 0.2 12/13/2020 1535   GFRNONAA 72 01/25/2020 1606   GFRAA 83 01/25/2020 1606   No results found for: CHOL, HDL, LDLCALC, LDLDIRECT, TRIG, CHOLHDL No results found for: VWAQ7R Lab Results  Component Value Date    VITAMINB12 1,130 01/13/2019   No results found for: TSH    ASSESSMENT AND PLAN 51 y.o. year old female   Multiple Sclerosis  -Overall stable, continue Gilenya  -Check CBC, CMP today -Recent low B 12, Vit D now on supplement from PCP -Imaging MRI brain and cervical spine were stable in April 2022 -Refilled Flexeril PRN for muscle spasms -Return back in 6 months, will be followed by Dr. Epimenio Foot   2. Fatigue -On Adderall now, at next refill, switch to Provigil 100 mg daily  3. Migraine headaches -Under good control off propranolol  -Continue Nurtec as needed for acute treatment   Otila Kluver, DNP 06/12/2021, 3:10 PM [facility] [address] [place], [place] [address] [phone]    I have read the note, and I agree with the clinical assessment and plan.  Richard A. Epimenio Foot, MD, PhD, [facility] Certified in Neurology, Clinical Neurophysiology, Sleep Medicine, Pain Medicine and Neuroimaging  [facility] [address] [place], [place] [address] [phone]

## 2021-06-12 ENCOUNTER — Other Ambulatory Visit: Payer: Self-pay

## 2021-06-12 ENCOUNTER — Other Ambulatory Visit: Payer: Self-pay | Admitting: Family Medicine

## 2021-06-12 ENCOUNTER — Ambulatory Visit (INDEPENDENT_AMBULATORY_CARE_PROVIDER_SITE_OTHER): Payer: Medicare Other | Admitting: Neurology

## 2021-06-12 VITALS — BP 159/86 | HR 93 | Ht 63.0 in | Wt 199.0 lb

## 2021-06-12 DIAGNOSIS — G43109 Migraine with aura, not intractable, without status migrainosus: Secondary | ICD-10-CM

## 2021-06-12 DIAGNOSIS — R5382 Chronic fatigue, unspecified: Secondary | ICD-10-CM | POA: Diagnosis not present

## 2021-06-12 DIAGNOSIS — G35 Multiple sclerosis: Secondary | ICD-10-CM

## 2021-06-12 DIAGNOSIS — Z1231 Encounter for screening mammogram for malignant neoplasm of breast: Secondary | ICD-10-CM

## 2021-06-12 MED ORDER — NURTEC 75 MG PO TBDP: 75.0000 mg | ORAL_TABLET | ORAL | 11 refills | Status: DC | PRN

## 2021-06-12 MED ORDER — FINGOLIMOD HCL 0.5 MG PO CAPS
0.5000 mg | ORAL_CAPSULE | Freq: Every day | ORAL | 11 refills | Status: DC
Start: 2021-06-12 — End: 2022-06-12

## 2021-06-12 MED ORDER — CYCLOBENZAPRINE HCL 5 MG PO TABS: ORAL_TABLET | ORAL | 5 refills | Status: DC

## 2021-06-13 ENCOUNTER — Other Ambulatory Visit: Payer: Self-pay | Admitting: *Deleted

## 2021-06-13 LAB — COMPREHENSIVE METABOLIC PANEL
ALT: 17 IU/L (ref 0–32)
AST: 14 IU/L (ref 0–40)
Albumin/Globulin Ratio: 2.1 (ref 1.2–2.2)
Albumin: 4.6 g/dL (ref 3.8–4.9)
Alkaline Phosphatase: 126 IU/L — ABNORMAL HIGH (ref 44–121)
BUN/Creatinine Ratio: 16 (ref 9–23)
BUN: 14 mg/dL (ref 6–24)
Bilirubin Total: 0.3 mg/dL (ref 0.0–1.2)
CO2: 22 mmol/L (ref 20–29)
Calcium: 9.6 mg/dL (ref 8.7–10.2)
Chloride: 102 mmol/L (ref 96–106)
Creatinine, Ser: 0.85 mg/dL (ref 0.57–1.00)
Globulin, Total: 2.2 g/dL (ref 1.5–4.5)
Glucose: 91 mg/dL (ref 70–99)
Potassium: 4.2 mmol/L (ref 3.5–5.2)
Sodium: 138 mmol/L (ref 134–144)
Total Protein: 6.8 g/dL (ref 6.0–8.5)
eGFR: 82 mL/min/{1.73_m2} (ref >59)

## 2021-06-13 LAB — CBC WITH DIFFERENTIAL/PLATELET
Basophils Absolute: 0.1 10*3/uL (ref 0.0–0.2)
Basos: 1 %
EOS (ABSOLUTE): 0.3 10*3/uL (ref 0.0–0.4)
Eos: 4 %
Hematocrit: 45.3 % (ref 34.0–46.6)
Hemoglobin: 15.6 g/dL (ref 11.1–15.9)
Immature Grans (Abs): 0 10*3/uL (ref 0.0–0.1)
Immature Granulocytes: 1 %
Lymphocytes Absolute: 0.5 10*3/uL — ABNORMAL LOW (ref 0.7–3.1)
Lymphs: 5 %
MCH: 30.2 pg (ref 26.6–33.0)
MCHC: 34.4 g/dL (ref 31.5–35.7)
MCV: 88 fL (ref 79–97)
Monocytes Absolute: 1 10*3/uL — ABNORMAL HIGH (ref 0.1–0.9)
Monocytes: 12 %
Neutrophils Absolute: 6.6 10*3/uL (ref 1.4–7.0)
Neutrophils: 77 %
Platelets: 370 10*3/uL (ref 150–450)
RBC: 5.16 x10E6/uL (ref 3.77–5.28)
RDW: 13.1 % (ref 11.7–15.4)
WBC: 8.5 10*3/uL (ref 3.4–10.8)

## 2021-06-13 MED ORDER — NURTEC 75 MG PO TBDP: 75.0000 mg | ORAL_TABLET | ORAL | 11 refills | Status: DC | PRN

## 2021-06-20 ENCOUNTER — Ambulatory Visit
Admission: RE | Admit: 2021-06-20 | Discharge: 2021-06-20 | Disposition: A | Discharge status: Home / Self Care | Payer: Medicare Other | Source: Ambulatory Visit | Attending: Family Medicine | Admitting: Family Medicine

## 2021-06-20 DIAGNOSIS — Z1231 Encounter for screening mammogram for malignant neoplasm of breast: Secondary | ICD-10-CM | POA: Diagnosis not present

## 2021-06-26 ENCOUNTER — Other Ambulatory Visit: Payer: Self-pay | Admitting: *Deleted

## 2021-06-26 ENCOUNTER — Other Ambulatory Visit: Payer: Self-pay | Admitting: Neurology

## 2021-06-27 ENCOUNTER — Telehealth: Payer: Self-pay | Admitting: *Deleted

## 2021-06-27 ENCOUNTER — Other Ambulatory Visit: Payer: Self-pay | Admitting: *Deleted

## 2021-06-27 MED ORDER — MODAFINIL 100 MG PO TABS: 100.0000 mg | ORAL_TABLET | Freq: Every day | ORAL | 3 refills | Status: DC

## 2021-06-27 NOTE — Telephone Encounter (Signed)
PA for modafinil 100mg  started on covermymeds (key: [id number]). Pt has pharmacy coverage through [employer] [phone]). (XA#[id number] approved through 12/28/2021.  ?

## 2021-06-27 NOTE — Telephone Encounter (Signed)
Stop Adderall, will switch to Provigil starting with 100 mg in AM.  ? ?Meds ordered this encounter  ?Medications  ? modafinil (PROVIGIL) 100 MG tablet  ?  Sig: Take 1 tablet (100 mg total) by mouth daily.  ?  Dispense:  30 tablet  ?  Refill:  3  ? ? ? ? ?

## 2021-07-04 ENCOUNTER — Other Ambulatory Visit: Payer: Self-pay

## 2021-07-04 ENCOUNTER — Ambulatory Visit
Admission: RE | Admit: 2021-07-04 | Discharge: 2021-07-04 | Disposition: A | Discharge status: Home / Self Care | Payer: Medicare Other | Source: Ambulatory Visit | Attending: Family Medicine | Admitting: Family Medicine

## 2021-07-04 DIAGNOSIS — Z87891 Personal history of nicotine dependence: Secondary | ICD-10-CM | POA: Diagnosis not present

## 2021-10-26 ENCOUNTER — Other Ambulatory Visit: Payer: Self-pay | Admitting: Neurology

## 2021-10-29 NOTE — Telephone Encounter (Signed)
Verify Drug Registry For Modafinil 100 Mg Tablet Last Filled: 09/25/2021 Quantity: 30 tablets for 30 days Last appointment: 06/12/2021 Next appointment: 12/13/2021

## 2021-12-11 DIAGNOSIS — E7800 Pure hypercholesterolemia, unspecified: Secondary | ICD-10-CM | POA: Diagnosis not present

## 2021-12-11 DIAGNOSIS — Z79899 Other long term (current) drug therapy: Secondary | ICD-10-CM | POA: Diagnosis not present

## 2021-12-11 DIAGNOSIS — Z87891 Personal history of nicotine dependence: Secondary | ICD-10-CM | POA: Diagnosis not present

## 2021-12-11 DIAGNOSIS — F32A Depression, unspecified: Secondary | ICD-10-CM | POA: Diagnosis not present

## 2021-12-11 DIAGNOSIS — E538 Deficiency of other specified B group vitamins: Secondary | ICD-10-CM | POA: Diagnosis not present

## 2021-12-11 DIAGNOSIS — R1319 Other dysphagia: Secondary | ICD-10-CM | POA: Diagnosis not present

## 2021-12-11 DIAGNOSIS — J439 Emphysema, unspecified: Secondary | ICD-10-CM | POA: Diagnosis not present

## 2021-12-11 DIAGNOSIS — E559 Vitamin D deficiency, unspecified: Secondary | ICD-10-CM | POA: Diagnosis not present

## 2021-12-11 DIAGNOSIS — G35 Multiple sclerosis: Secondary | ICD-10-CM | POA: Diagnosis not present

## 2021-12-11 DIAGNOSIS — I10 Essential (primary) hypertension: Secondary | ICD-10-CM | POA: Diagnosis not present

## 2021-12-13 ENCOUNTER — Encounter: Payer: Self-pay | Admitting: Neurology

## 2021-12-13 ENCOUNTER — Ambulatory Visit (INDEPENDENT_AMBULATORY_CARE_PROVIDER_SITE_OTHER): Payer: Medicare Other | Admitting: Neurology

## 2021-12-13 VITALS — BP 183/96 | HR 87 | Ht 63.0 in | Wt 216.0 lb

## 2021-12-13 DIAGNOSIS — Z79899 Other long term (current) drug therapy: Secondary | ICD-10-CM

## 2021-12-13 DIAGNOSIS — R5382 Chronic fatigue, unspecified: Secondary | ICD-10-CM

## 2021-12-13 DIAGNOSIS — G43109 Migraine with aura, not intractable, without status migrainosus: Secondary | ICD-10-CM

## 2021-12-13 DIAGNOSIS — G35 Multiple sclerosis: Secondary | ICD-10-CM

## 2021-12-13 DIAGNOSIS — R269 Unspecified abnormalities of gait and mobility: Secondary | ICD-10-CM | POA: Diagnosis not present

## 2021-12-13 MED ORDER — AMPHETAMINE-DEXTROAMPHETAMINE 10 MG PO TABS: 10.0000 mg | ORAL_TABLET | Freq: Every day | ORAL | 0 refills | Status: DC

## 2021-12-13 NOTE — Progress Notes (Signed)
[facility]  PATIENT: Sue Sanchez DOB: 1971-03-02  REFERRING DOCTOR OR PCP:  Burnell Blanks, MD SOURCE: Patient, notes from Dr. Anne Hahn, imaging and lab reports, MRI images personally reviewed.  _________________________________   HISTORICAL  CHIEF COMPLAINT:  Chief Complaint  Patient presents with   Follow-up    RM 2, alone. Last seen 06/07/21. MS DMT: on generic Gilenya.     HISTORY OF PRESENT ILLNESS:  Sue Sanchez is a 51 y.o. woman with multiple sclerosis.  She is transferring care from Dr. Anne Hahn.  MS history: She was diagnosed with MS in 2014 after presenting with left sided numbness.   She was placed on Copaxone but had issues with the injections.  She then switched to Tecfidera but had GI issues/gastritis.   She then swithced to Gilenya around since 2016.    She has not had any major exacerbation though symptoms fluctuate.  Currently, she feels gait is slow and she feels balance is off.  She notes her fingertips are numb.   She notes numbness in her legs if she sits a long time.    She reports some issues with swallowing.  Speech is fine.    She has urinary urgency and occasional leakage.    Vision does well for the most part -- but blurriness occurs when she looks at a computer screen a long time.     She has fatigue and is on modafinil.     She had previously been on Adderall and felt MS fatigue as well as attention/focus she did better  She has migraines headaches and gets them 2 times a week.   Ibuprofen helps if mild    Nurtec works better than Relpax if Ibu does not work.        IMAGING MRI brain 08/04/2021 , 08/14/2019 09/08/2014 show the same few T2/FLAIR hyperintense foci in the juxtacortical and periventricular white matter of the cerebral hemispheres in a pattern configuration consistent with chronic demyelinating plaque associated with multiple sclerosis.  Infratentorial white matter was normal.  None of the foci appear to be acute.   The overall plaque burden is light  MRI cervical spine 08/04/2020 showed no spinal plaques.  She has some degenerative changes with mild spinal stenosis at C3-C4.   REVIEW OF SYSTEMS: Constitutional: No fevers, chills, sweats, or change in appetite.  She notes fatigue. Eyes: No visual changes, double vision, eye pain Ear, nose and throat: No hearing loss, ear pain, nasal congestion, sore throat Cardiovascular: No chest pain, palpitations Respiratory:  No shortness of breath at rest or with exertion.   No wheezes GastrointestinaI: No nausea, vomiting, diarrhea, abdominal pain, fecal incontinence Genitourinary:  No dysuria, urinary retention or frequency.  No nocturia. Musculoskeletal:  No neck pain, back pain Integumentary: No rash, pruritus, skin lesions Neurological: as above Psychiatric: No depression at this time.  No anxiety Endocrine: No palpitations, diaphoresis, change in appetite, change in weigh or increased thirst Hematologic/Lymphatic:  No anemia, purpura, petechiae. Allergic/Immunologic: No itchy/runny eyes, nasal congestion, recent allergic reactions, rashes  ALLERGIES: No Known Allergies  HOME MEDICATIONS:  Current Outpatient Medications:    budesonide-formoterol (SYMBICORT) 160-4.5 MCG/ACT inhaler, Inhale 2 puffs into the lungs 2 (two) times daily., Disp: , Rfl:    CVS D3 25 MCG (1000 UT) capsule, TAKE 1 CAPSULE BY MOUTH DAILY., Disp: 90 capsule, Rfl: 3   Cyanocobalamin (B-12 PO), Take 1 tablet by mouth daily. , Disp: , Rfl:    cyclobenzaprine (FLEXERIL) 5 MG tablet, TAKE 2 TABLETS  BY MOUTH AT  BEDTIME AS NEEDED FOR  MUSCLE SPASMS, Disp: 60 tablet, Rfl: 5   escitalopram (LEXAPRO) 10 MG tablet, TAKE 1 TABLET BY MOUTH  DAILY, Disp: 90 tablet, Rfl: 3   Esomeprazole Magnesium (NEXIUM PO), Take 1 tablet by mouth 2 (two) times daily., Disp: , Rfl:    Fingolimod HCl (GILENYA) 0.5 MG CAPS, Take 1 capsule (0.5 mg total) by mouth daily., Disp: 30 capsule, Rfl: 11    hydrochlorothiazide (HYDRODIURIL) 25 MG tablet, Take 25 mg by mouth daily., Disp: , Rfl:    ibuprofen (ADVIL,MOTRIN) 200 MG tablet, Take 3 tablets (600 mg total) by mouth every 6 (six) hours as needed for headache or moderate pain., Disp: 30 tablet, Rfl: 0   modafinil (PROVIGIL) 100 MG tablet, TAKE 1 TABLET BY MOUTH EVERY DAY, Disp: 30 tablet, Rfl: 3   PROVENTIL HFA 108 (90 BASE) MCG/ACT inhaler, Inhale 2 puffs into the lungs every 6 (six) hours as needed for wheezing or shortness of breath. , Disp: , Rfl:    Rimegepant Sulfate (NURTEC) 75 MG TBDP, Take 75 mg by mouth as needed (Take 1 tablet at headache onset, max is 1 tablet in 24 hours)., Disp: 16 tablet, Rfl: 11   rosuvastatin (CRESTOR) 10 MG tablet, Take 10 mg by mouth at bedtime., Disp: , Rfl:   PAST MEDICAL HISTORY: Past Medical History:  Diagnosis Date   Abnormality of gait 03/03/2015   B12 deficiency 08/31/2014   Chronic fatigue 08/31/2014   Classic migraine 01/12/2016   COPD (chronic obstructive pulmonary disease) (HCC)    Gastroesophageal reflux disease    Migraine    MS (multiple sclerosis) (HCC)    Numbness    bilateral lower extremity   Obesity    Osteoarthritis    diagnosed by orthopedics   Vitamin D deficiency     PAST SURGICAL HISTORY: Past Surgical History:  Procedure Laterality Date   CESAREAN SECTION     COSMETIC SURGERY     lower right leg   FOOT SURGERY     Club feet   NASAL SINUS SURGERY     10/23/2016,,  dev septum, other   TUBAL LIGATION Bilateral     FAMILY HISTORY: Family History  Problem Relation Age of Onset   Diabetes Mother    Bipolar disorder Sister    Bipolar disorder Brother    Cancer Maternal Grandmother    COPD Paternal Grandfather    Heart disease Paternal Grandfather     SOCIAL HISTORY:  Social History   Socioeconomic History   Marital status: Divorced    Spouse name: Not on file   Number of children: 2   Years of education: college   Highest education level: Not on file   Occupational History   Occupation: Unemployed  Tobacco Use   Smoking status: Every Day    Packs/day: 0.50    Years: 30.00    Total pack years: 15.00    Types: Cigarettes   Smokeless tobacco: Never  Substance and Sexual Activity   Alcohol use: No    Alcohol/week: 0.0 standard drinks of alcohol   Drug use: No   Sexual activity: Yes  Other Topics Concern   Not on file  Social History Narrative   Patient is Divorced with 2 children.   Patient has a college education.   Patient is right handed.   Caffeine Intake: 6 or more sodas daily      Patient has a dog named Butters.   Social Determinants of Health  Financial Resource Strain: Not on file  Food Insecurity: Not on file  Transportation Needs: Not on file  Physical Activity: Not on file  Stress: Not on file  Social Connections: Not on file  Intimate Partner Violence: Not on file     PHYSICAL EXAM  Vitals:   12/13/21 1446  BP: (!) 183/96  Pulse: 87  Weight: 216 lb (98 kg)  Height: 5\' 3"  (1.6 m)    Body mass index is 38.26 kg/m.   General: The patient is well-developed and well-nourished and in no acute distress  HEENT:  Head is [place]/AT.  Sclera are anicteric.    Neck: No carotid bruits are noted.  The neck is nontender.  Cardiovascular: The heart has a regular rate and rhythm with a normal S1 and S2. There were no murmurs, gallops or rubs.    Skin: Extremities are without rash or  edema.  Musculoskeletal:  Back is nontender  Neurologic Exam  Mental status: The patient is alert and oriented x 3 at the time of the examination. The patient has apparent normal recent and remote memory, with an apparently normal attention span and concentration ability.   Speech is normal.  Cranial nerves: Extraocular movements are full. Pupils are equal, round, and reactive to light and accomodation.  Visual fields are full.  Facial symmetry is present. There is good facial sensation to soft touch bilaterally.Facial strength  is normal.  Trapezius and sternocleidomastoid strength is normal. No dysarthria is noted.  The tongue is midline, and the patient has symmetric elevation of the soft palate. No obvious hearing deficits are noted.  Motor:  Muscle bulk is normal.   Tone is normal. Strength is  5 / 5 in all 4 extremities.   Sensory: Sensory testing is intact to pinprick, soft touch and vibration sensation in all 4 extremities.  Coordination: Cerebellar testing reveals good finger-nose-finger and heel-to-shin bilaterally.  Gait and station: Station is normal.   Gait is mildly wide. Tandem gait is wide. Romberg is negative.   Reflexes: Deep tendon reflexes are symmetric and normal bilaterally.   Plantar responses are flexor.    DIAGNOSTIC DATA (LABS, IMAGING, TESTING) - I reviewed patient records, labs, notes, testing and imaging myself where available.  Lab Results  Component Value Date   WBC 8.5 06/12/2021   HGB 15.6 06/12/2021   HCT 45.3 06/12/2021   MCV 88 06/12/2021   PLT 370 06/12/2021      Component Value Date/Time   NA 138 06/12/2021 1528   K 4.2 06/12/2021 1528   CL 102 06/12/2021 1528   CO2 22 06/12/2021 1528   GLUCOSE 91 06/12/2021 1528   BUN 14 06/12/2021 1528   CREATININE 0.85 06/12/2021 1528   CALCIUM 9.6 06/12/2021 1528   PROT 6.8 06/12/2021 1528   ALBUMIN 4.6 06/12/2021 1528   AST 14 06/12/2021 1528   ALT 17 06/12/2021 1528   ALKPHOS 126 (H) 06/12/2021 1528   BILITOT 0.3 06/12/2021 1528   GFRNONAA 72 01/25/2020 1606   GFRAA 83 01/25/2020 1606    Lab Results  Component Value Date   VITAMINB12 1,130 01/13/2019   No results found for: "TSH"     ASSESSMENT AND PLAN  MS (multiple sclerosis) (HCC) - Plan: CBC with Differential/Platelet  High risk medication use - Plan: CBC with Differential/Platelet  Migraine with aura and without status migrainosus, not intractable  Chronic fatigue  Abnormality of gait  Ms. Laino is a 51 year old woman with multiple  sclerosis diagnosed in 2014.  She is currently on Gilenya.  Although she has had some fluctuating symptoms.  There does not appear to have been any recent exacerbations.  MRIs from 2016 through 2023 show a low plaque burden without any changes.  Continue Gilenya.  We will check a CBC with differential. She had done better on Adderall with then modafinil and would like to switch back.  I will send in a prescription.  We could adjust the dose if needed based on response. Stay active and exercise as tolerated. Return in 6 months or sooner if there are new or worsening neurologic symptoms.  40-minute office visit with the majority of the time spent face-to-face for history and physical, discussion/counseling and decision-making.  Additional time with record review and documentation.   Vyom Brass A. Epimenio Foot, MD, [facility] 12/13/2021, 3:18 PM Certified in Neurology, Clinical Neurophysiology, Sleep Medicine and Neuroimaging  [facility] [address], [address] [place], [place] [address] [phone]

## 2021-12-13 NOTE — Progress Notes (Signed)
Called PCP office, Burnell Blanks, MD at [phone]. Spoke w/ Alvino Chapel. Advised we did not receive lab results pt requested to be faxed over. She will refax to [phone].

## 2021-12-14 LAB — CBC WITH DIFFERENTIAL/PLATELET
Basophils Absolute: 0.1 10*3/uL (ref 0.0–0.2)
Basos: 1 %
EOS (ABSOLUTE): 0.4 10*3/uL (ref 0.0–0.4)
Eos: 5 %
Hematocrit: 44.5 % (ref 34.0–46.6)
Hemoglobin: 15.5 g/dL (ref 11.1–15.9)
Immature Grans (Abs): 0 10*3/uL (ref 0.0–0.1)
Immature Granulocytes: 0 %
Lymphocytes Absolute: 0.4 10*3/uL — ABNORMAL LOW (ref 0.7–3.1)
Lymphs: 6 %
MCH: 30.9 pg (ref 26.6–33.0)
MCHC: 34.8 g/dL (ref 31.5–35.7)
MCV: 89 fL (ref 79–97)
Monocytes Absolute: 1.2 10*3/uL — ABNORMAL HIGH (ref 0.1–0.9)
Monocytes: 16 %
Neutrophils Absolute: 5.4 10*3/uL (ref 1.4–7.0)
Neutrophils: 72 %
Platelets: 339 10*3/uL (ref 150–450)
RBC: 5.01 x10E6/uL (ref 3.77–5.28)
RDW: 12.7 % (ref 11.7–15.4)
WBC: 7.5 10*3/uL (ref 3.4–10.8)

## 2021-12-18 ENCOUNTER — Telehealth: Payer: Self-pay | Admitting: *Deleted

## 2021-12-18 NOTE — Telephone Encounter (Signed)
Dr. Epimenio Foot received recent labs pt had completed on 12/11/21 that was completed by PCP. Copy of results below. Per Dr. Epimenio Foot: Vit D mildly low. If not already on Vit D 5000U po qd, he would recommend for her to start.  I called pt. Relayed above info. She is currently taking 1000U po qd but will increase to 5000U po qd.

## 2022-01-08 DIAGNOSIS — I10 Essential (primary) hypertension: Secondary | ICD-10-CM | POA: Diagnosis not present

## 2022-01-14 ENCOUNTER — Other Ambulatory Visit: Payer: Self-pay | Admitting: Neurology

## 2022-01-14 MED ORDER — AMPHETAMINE-DEXTROAMPHETAMINE 10 MG PO TABS
10.0000 mg | ORAL_TABLET | Freq: Every day | ORAL | 0 refills | Status: DC
Start: 2022-01-14 — End: 2022-02-13

## 2022-01-14 NOTE — Telephone Encounter (Signed)
Pt has an up coming appt and been checked in the registry. 

## 2022-01-29 ENCOUNTER — Encounter: Payer: Self-pay | Admitting: Neurology

## 2022-01-30 MED ORDER — VITAMIN D-3 125 MCG (5000 UT) PO TABS: 5000.0000 [IU] | ORAL_TABLET | Freq: Every day | ORAL | 0 refills | Status: AC

## 2022-01-31 ENCOUNTER — Encounter: Payer: Self-pay | Admitting: Gastroenterology

## 2022-02-12 DIAGNOSIS — H04123 Dry eye syndrome of bilateral lacrimal glands: Secondary | ICD-10-CM | POA: Diagnosis not present

## 2022-02-12 DIAGNOSIS — G35 Multiple sclerosis: Secondary | ICD-10-CM | POA: Diagnosis not present

## 2022-02-12 DIAGNOSIS — Z79899 Other long term (current) drug therapy: Secondary | ICD-10-CM | POA: Diagnosis not present

## 2022-02-12 DIAGNOSIS — H2513 Age-related nuclear cataract, bilateral: Secondary | ICD-10-CM | POA: Diagnosis not present

## 2022-02-13 ENCOUNTER — Other Ambulatory Visit: Payer: Self-pay | Admitting: Neurology

## 2022-02-13 MED ORDER — AMPHETAMINE-DEXTROAMPHETAMINE 10 MG PO TABS: 10.0000 mg | ORAL_TABLET | Freq: Every day | ORAL | 0 refills | Status: DC

## 2022-03-12 ENCOUNTER — Other Ambulatory Visit: Payer: Self-pay | Admitting: Neurology

## 2022-03-13 MED ORDER — AMPHETAMINE-DEXTROAMPHETAMINE 10 MG PO TABS: 10.0000 mg | ORAL_TABLET | Freq: Every day | ORAL | 0 refills | Status: DC

## 2022-03-13 NOTE — Telephone Encounter (Signed)
Pt last seen 12/13/21 and next f/u 06/20/22. Per drug registry, last refilled 02/13/22 #30.

## 2022-03-14 ENCOUNTER — Encounter: Payer: Self-pay | Admitting: Neurology

## 2022-03-15 ENCOUNTER — Other Ambulatory Visit (HOSPITAL_COMMUNITY): Payer: Self-pay | Admitting: *Deleted

## 2022-03-15 ENCOUNTER — Ambulatory Visit (INDEPENDENT_AMBULATORY_CARE_PROVIDER_SITE_OTHER): Payer: Medicare Other | Admitting: Gastroenterology

## 2022-03-15 ENCOUNTER — Encounter: Payer: Self-pay | Admitting: Gastroenterology

## 2022-03-15 VITALS — BP 130/82 | HR 88 | Ht 63.0 in | Wt 208.4 lb

## 2022-03-15 DIAGNOSIS — Z1211 Encounter for screening for malignant neoplasm of colon: Secondary | ICD-10-CM

## 2022-03-15 DIAGNOSIS — R079 Chest pain, unspecified: Secondary | ICD-10-CM

## 2022-03-15 DIAGNOSIS — K219 Gastro-esophageal reflux disease without esophagitis: Secondary | ICD-10-CM

## 2022-03-15 DIAGNOSIS — R1310 Dysphagia, unspecified: Secondary | ICD-10-CM | POA: Diagnosis not present

## 2022-03-15 MED ORDER — NA SULFATE-K SULFATE-MG SULF 17.5-3.13-1.6 GM/177ML PO SOLN: 1.0000 | Freq: Once | ORAL | 0 refills | Status: AC

## 2022-03-15 NOTE — Progress Notes (Signed)
Chief Complaint: Dysphagia, colon cancer screening   Referring Provider:     Ailene Ravel, MD    HPI:     Sue Sanchez is a 51 y.o. female with a history of MS diagnosed 2014, obesity, migraines, OSA, GERD, emphysema, referred to the [facility]ology [facility] for evaluation of dysphagia and chest pain, and to discuss CRC screening.  Choking on liquids and saliva. No issue with solids. Present for 6-12 months. Last episode was 1 week ago, and occurs at random. Will feel a choking sensation then coughing. Per patient, Neurologist did not think this was related to MS.   Does have a long hx of GERD and has been on Nexium 40 mg/day for many years. Rare breakthrough sxs, treated with Tums or OTC Pepcid prn. No prior EGD.   Her chest pain is described as substernal, lasting a couple of minutes.  Has been present intermittently for a long time.  No associated SOB, DOE.  Negative Cologuard in 2021. No previous colonoscopy. No hematochezia, melena, change in bowel habits.    Follows in the neurology clinic for her MS and migraines.  PGM with Colon Cancer diagnosed in her 61's. No known family history of CRC, GI malignancy, liver disease, pancreatic disease, or IBD.     Past Medical History:  Diagnosis Date   Abnormality of gait 03/03/2015   B12 deficiency 08/31/2014   Chronic fatigue 08/31/2014   Classic migraine 01/12/2016   COPD (chronic obstructive pulmonary disease) (HCC)    Gastroesophageal reflux disease    Migraine    MS (multiple sclerosis) (HCC)    Numbness    bilateral lower extremity   Obesity    Osteoarthritis    diagnosed by orthopedics   Vitamin D deficiency      Past Surgical History:  Procedure Laterality Date   CESAREAN SECTION     COSMETIC SURGERY     lower right leg   FOOT SURGERY     Club feet   NASAL SINUS SURGERY     10/23/2016,,  dev septum, other   TUBAL LIGATION Bilateral    Family History  Problem Relation Age of Onset    Diabetes Mother    Bipolar disorder Sister    Bipolar disorder Brother    Cancer Maternal Grandmother    Colon cancer Paternal Grandmother    COPD Paternal Grandfather    Heart disease Paternal Grandfather    Social History   Tobacco Use   Smoking status: Every Day    Packs/day: 0.50    Years: 30.00    Total pack years: 15.00    Types: Cigarettes   Smokeless tobacco: Never  Substance Use Topics   Alcohol use: No    Alcohol/week: 0.0 standard drinks of alcohol   Drug use: No   Current Outpatient Medications  Medication Sig Dispense Refill   amphetamine-dextroamphetamine (ADDERALL) 10 MG tablet Take 1 tablet (10 mg total) by mouth daily with breakfast. 30 tablet 0   budesonide-formoterol (SYMBICORT) 160-4.5 MCG/ACT inhaler Inhale 2 puffs into the lungs 2 (two) times daily.     Cholecalciferol (VITAMIN D-3) 125 MCG (5000 UT) TABS Take 5,000 Units by mouth daily. 90 tablet 0   Cyanocobalamin (B-12 PO) Take 1 tablet by mouth daily.      cyclobenzaprine (FLEXERIL) 5 MG tablet TAKE 2 TABLETS BY MOUTH AT  BEDTIME AS NEEDED FOR  MUSCLE SPASMS 60 tablet 5   escitalopram (LEXAPRO) 10  MG tablet TAKE 1 TABLET BY MOUTH  DAILY 90 tablet 3   Esomeprazole Magnesium (NEXIUM PO) Take 1 tablet by mouth 2 (two) times daily.     Fingolimod HCl (GILENYA) 0.5 MG CAPS Take 1 capsule (0.5 mg total) by mouth daily. 30 capsule 11   hydrochlorothiazide (HYDRODIURIL) 25 MG tablet Take 25 mg by mouth daily.     ibuprofen (ADVIL,MOTRIN) 200 MG tablet Take 3 tablets (600 mg total) by mouth every 6 (six) hours as needed for headache or moderate pain. 30 tablet 0   modafinil (PROVIGIL) 100 MG tablet TAKE 1 TABLET BY MOUTH EVERY DAY 30 tablet 3   PROVENTIL HFA 108 (90 BASE) MCG/ACT inhaler Inhale 2 puffs into the lungs every 6 (six) hours as needed for wheezing or shortness of breath.      Rimegepant Sulfate (NURTEC) 75 MG TBDP Take 75 mg by mouth as needed (Take 1 tablet at headache onset, max is 1 tablet in 24  hours). 16 tablet 11   rosuvastatin (CRESTOR) 10 MG tablet Take 10 mg by mouth at bedtime.     No current facility-administered medications for this visit.   No Known Allergies   Review of Systems: All systems reviewed and negative except where noted in HPI.     Physical Exam:    Wt Readings from Last 3 Encounters:  03/15/22 208 lb 6.4 oz (94.5 kg)  12/13/21 216 lb (98 kg)  06/12/21 199 lb (90.3 kg)    BP 130/82   Pulse 88   Ht 5\' 3"  (1.6 m)   Wt 208 lb 6.4 oz (94.5 kg)   BMI 36.92 kg/m  Constitutional:  Pleasant, in no acute distress. Psychiatric: Normal mood and affect. Behavior is normal. Cardiovascular: Normal rate, regular rhythm. No edema Pulmonary/chest: Effort normal and breath sounds normal. No wheezing, rales or rhonchi. Abdominal: Soft, nondistended, nontender. Bowel sounds active throughout. There are no masses palpable. No hepatomegaly. Neurological: Alert and oriented to person place and time.   ASSESSMENT AND PLAN;   1) Dysphagia Symptoms seemingly more consistent with oral pharyngeal dysphagia based on her description, but also has some component of esophageal symptomatology. - Modified barium swallow - EGD to evaluate for stricture, luminal narrowing, etc. with esophageal dilation and/or biopsies as appropriate - If above evaluation all unremarkable, plan for Esophageal Manometry - Discussed potential relation between symptomatology and underlying MS  2) GERD Longstanding history of reflux, generally well controlled with Nexium, and uses Pepcid and Tums for breakthrough.  No previous EGD. - EGD to evaluate for erosive esophagitis, LES laxity, hiatal hernia, along with Barrett's Esophagus screening - Continue Nexium - Continue antireflux lifestyle/dietary modifications  3) Chest pain Based on description, seems more likely related to reflux than cardiac.  No recent EKG.  I recommended that she follow-up with her PCM closely to evaluate this further,  to include EKG in office.  To have this evaluation done prior to EGD/colonoscopy.  4) Colon cancer screening Due for repeat CRC screening in 2024.  Discussed screening options, and would like to proceed with optical colonoscopy - Schedule colonoscopy to be done at the same time as EGD for CRC screening   The indications, risks, and benefits of EGD and colonoscopy were explained to the patient in detail. Risks include but are not limited to bleeding, perforation, adverse reaction to medications, and cardiopulmonary compromise. Sequelae include but are not limited to the possibility of surgery, hospitalization, and mortality. The patient verbalized understanding and wished to proceed. All  questions answered, referred to scheduler and bowel prep ordered. Further recommendations pending results of the exam.    Shellia Cleverly, DO, FACG  03/15/2022, 11:11 AM   Hamrick, Durward Fortes, MD

## 2022-03-15 NOTE — Patient Instructions (Signed)
You have been scheduled for a modified barium swallow on Tuesday 03/27/23 at 11 am. Please arrive 30 minutes prior to your test for registration. You will go to [facility] (Entrance C - Women's and Children) for your appointment. Should you need to cancel or reschedule your appointment, please contact [phone] Patrcia Dolly Cole) or [phone] [facility]). _____________________________________________________________________ A Modified Barium Swallow Study, or MBS, is a special x-ray that is taken to check swallowing skills. It is carried out by a Marine scientist and a Warehouse manager (SLP). During this test, yourmouth, throat, and esophagus, a muscular tube which connects your mouth to your stomach, is checked. The test will help you, your doctor, and the SLP plan what types of foods and liquids are easier for you to swallow. The SLP will also identify positions and ways to help you swallow more easily and safely. What will happen during an MBS? You will be taken to an x-ray room and seated comfortably. You will be asked to swallow small amounts of food and liquid mixed with barium. Barium is a liquid or paste that allows images of your mouth, throat and esophagus to be seen on x-ray. The x-ray captures moving images of the food you are swallowing as it travels from your mouth through your throat and into your esophagus. This test helps identify whether food or liquid is entering your lungs (aspiration). The test also shows which part of your mouth or throat lacks strength or coordination to move the food or liquid in the right direction. This test typically takes 30 minutes to 1 hour to complete. _______________________________________________________________________   Bonita Quin have been scheduled for an endoscopy and colonoscopy. Please follow the written instructions given to you at your visit today. Please pick up your prep supplies at the pharmacy within the next 1-3  days. If you use inhalers (even only as needed), please bring them with you on the day of your procedure.  _______________________________________________________  If you are age 58 or older, your body mass index should be between 23-30. Your Body mass index is 36.92 kg/m. If this is out of the aforementioned range listed, please consider follow up with your Primary Care Provider.  If you are age 36 or younger, your body mass index should be between 19-25. Your Body mass index is 36.92 kg/m. If this is out of the aformentioned range listed, please consider follow up with your Primary Care Provider.   ________________________________________________________  The [place] [facility] providers would like to encourage you to use [facility] to communicate with providers for non-urgent requests or questions.  Due to long hold times on the telephone, sending your provider a message by [facility] may be a faster and more efficient way to get a response.  Please allow 48 business hours for a response.  Please remember that this is for non-urgent requests.  _______________________________________________________

## 2022-03-26 ENCOUNTER — Ambulatory Visit (HOSPITAL_COMMUNITY): Payer: Medicare Other

## 2022-03-26 ENCOUNTER — Encounter (HOSPITAL_COMMUNITY): Payer: Medicare Other

## 2022-03-29 ENCOUNTER — Ambulatory Visit (HOSPITAL_COMMUNITY): Payer: Medicare Other

## 2022-04-02 ENCOUNTER — Ambulatory Visit (HOSPITAL_COMMUNITY)
Admission: RE | Admit: 2022-04-02 | Discharge: 2022-04-02 | Disposition: A | Discharge status: Home / Self Care | Payer: Medicare Other | Source: Ambulatory Visit | Attending: Family Medicine | Admitting: Family Medicine

## 2022-04-02 DIAGNOSIS — J449 Chronic obstructive pulmonary disease, unspecified: Secondary | ICD-10-CM | POA: Insufficient documentation

## 2022-04-02 DIAGNOSIS — R059 Cough, unspecified: Secondary | ICD-10-CM | POA: Diagnosis not present

## 2022-04-02 DIAGNOSIS — R1310 Dysphagia, unspecified: Secondary | ICD-10-CM

## 2022-04-02 NOTE — Progress Notes (Signed)
Modified Barium Swallow Progress Note  Patient Details  Name: Sue Sanchez MRN: [id number] Date of Birth: 1970/09/03  Today's Date: 04/02/2022  Modified Barium Swallow completed.  Full report located under Chart Review in the Imaging Section.  Brief recommendations include the following:  Clinical Impression  Pt's oropharyngeal swallow is WFL across consistencies tested. She did have some slight hesitation in oral transit with barium tablet but this cleared readily with an additional liquid wash. No findings to explain pt's reported symptoms. Would continue ongoing work up with GI. Depending on findings, MD may wish to consider ENT consult to evaluate vocal cord function and/or OP SLP for cough suppression strategies.   Swallow Evaluation Recommendations   Recommended Consults: Consider GI evaluation;Consider ENT evaluation   SLP Diet Recommendations: Regular solids;Thin liquid   Liquid Administration via: Cup;Straw   Medication Administration: Whole meds with liquid   Supervision: Patient able to self feed   Compensations: Slow rate;Small sips/bites;Follow solids with liquid   Postural Changes: Remain semi-upright after after feeds/meals (Comment);Seated upright at 90 degrees   Oral Care Recommendations: Oral care BID        Mahala Menghini., M.A. CCC-SLP Acute Rehabilitation Services Office [phone]  Secure chat preferred  04/02/2022,1:00 PM

## 2022-04-04 ENCOUNTER — Other Ambulatory Visit: Payer: Self-pay

## 2022-04-04 DIAGNOSIS — K219 Gastro-esophageal reflux disease without esophagitis: Secondary | ICD-10-CM

## 2022-04-04 DIAGNOSIS — R1310 Dysphagia, unspecified: Secondary | ICD-10-CM

## 2022-04-10 ENCOUNTER — Other Ambulatory Visit: Payer: Self-pay | Admitting: Neurology

## 2022-04-12 ENCOUNTER — Other Ambulatory Visit: Payer: Self-pay | Admitting: Neurology

## 2022-04-16 ENCOUNTER — Other Ambulatory Visit: Payer: Self-pay | Admitting: Neurology

## 2022-04-16 MED ORDER — AMPHETAMINE-DEXTROAMPHETAMINE 10 MG PO TABS: 10.0000 mg | ORAL_TABLET | Freq: Every day | ORAL | 0 refills | Status: DC

## 2022-04-17 MED ORDER — AMPHETAMINE-DEXTROAMPHETAMINE 10 MG PO TABS: 10.0000 mg | ORAL_TABLET | Freq: Every day | ORAL | 0 refills | Status: DC

## 2022-04-19 ENCOUNTER — Telehealth: Payer: Self-pay | Admitting: *Deleted

## 2022-04-19 NOTE — Telephone Encounter (Signed)
Received paper request to complete PA for Nurtec from covermymeds. Key: [id number].  I went to complete and received the following message:  "This medication or product was previously approved on [id number] from 2022-04-15 to 2023-04-15. **Please note: This request was submitted electronically. Formulary lowering, tiering exception, cost reduction and/or pre-benefit determination review (including prospective Medicare hospice reviews) requests cannot be requested using this method of submission. Providers contact us at [phone] for further assistance."

## 2022-05-10 ENCOUNTER — Encounter: Payer: Medicare Other | Admitting: Gastroenterology

## 2022-05-13 IMAGING — CT CT CHEST LUNG CANCER SCREENING LOW DOSE W/O CM
1 of 2 series · 15 of 33 positions shown, 19 images · non-contrast
Comparison: None.

CLINICAL DATA: 50-year-old female with 37 pack-year history of
smoking. Lung cancer screening.



[Series 4: super d · axial · 0.80mm/px · z∈[-406,-85]mm · 15 of 441 slices shown, 19 images]
[im 20/441  mediastinal]
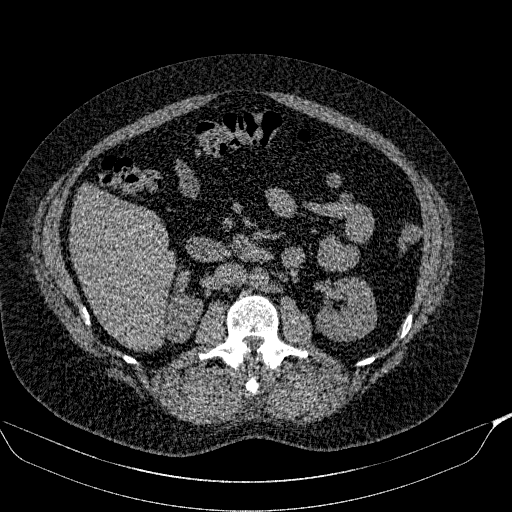
[im 20/441  lung]
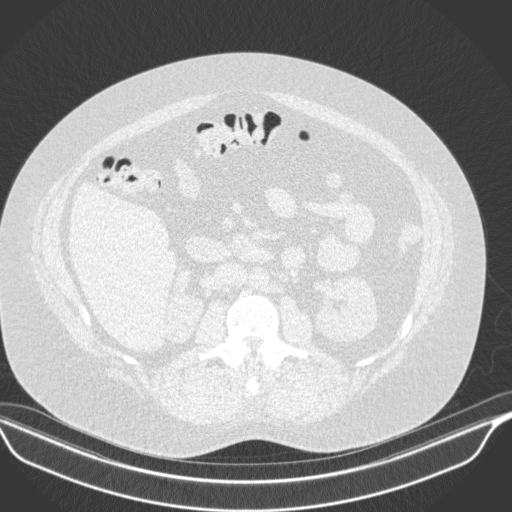
[im 58/441  lung]
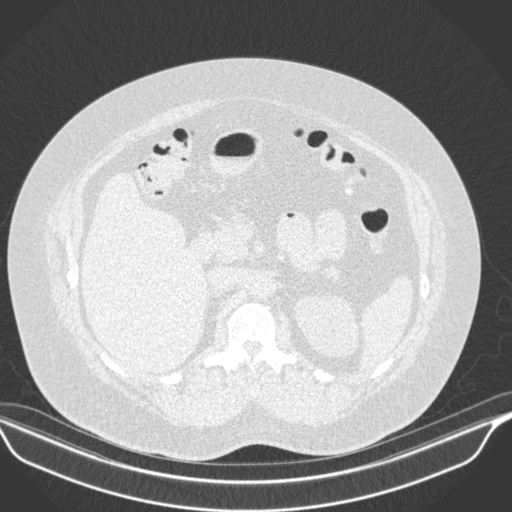
[im 96/441  lung]
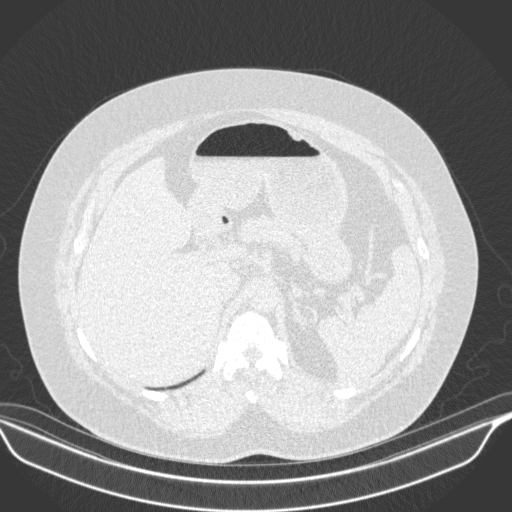
[im 115/441  lung]
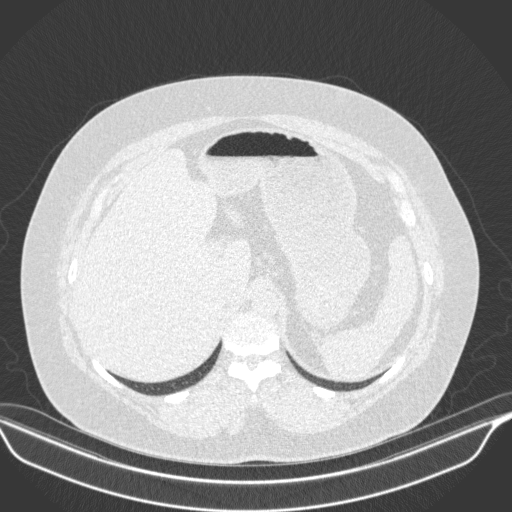
[im 134/441  mediastinal]
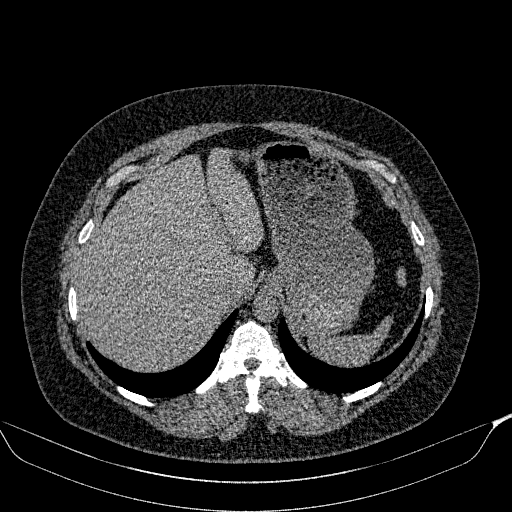
[im 134/441  lung]
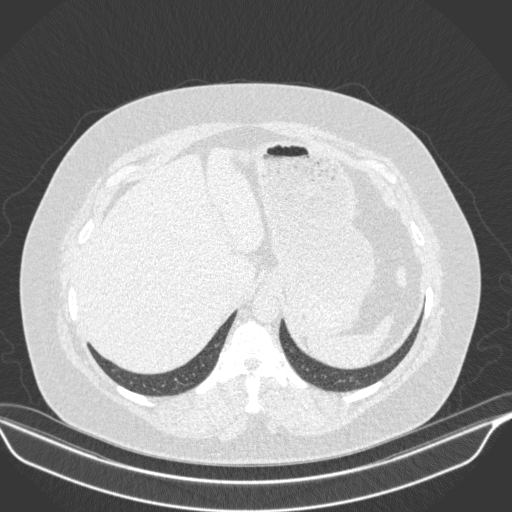
[im 173/441  lung]
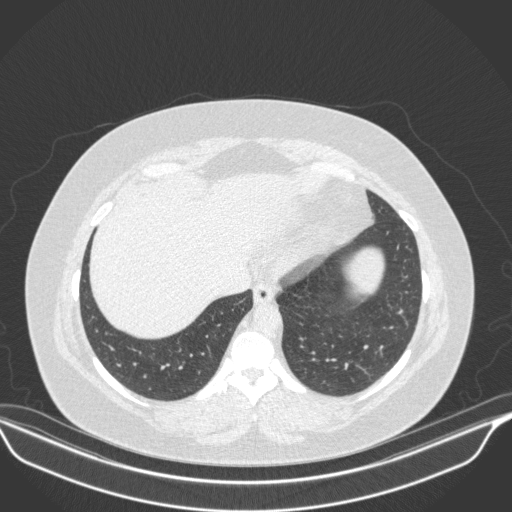
[im 209/441  lung]
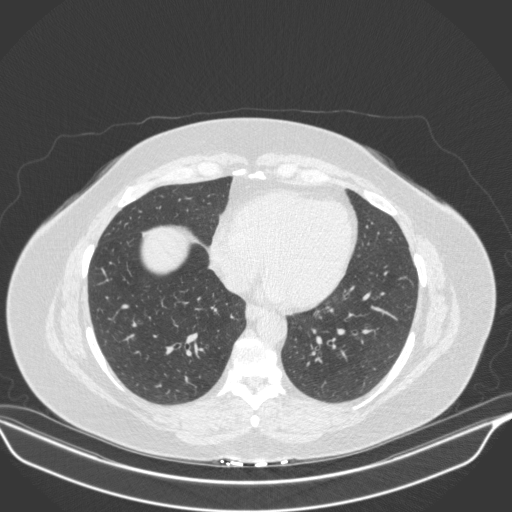
[im 221/441  lung]
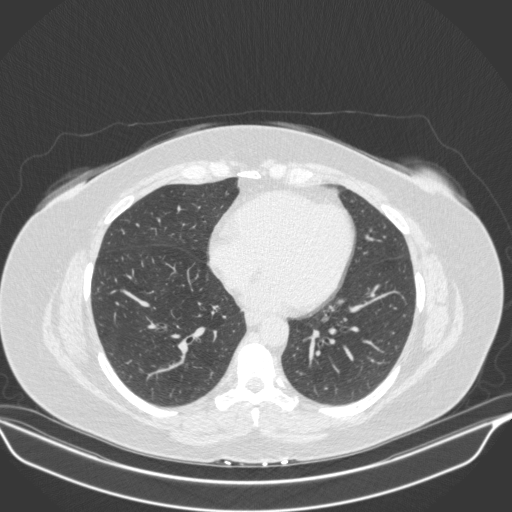
[im 230/441  mediastinal]
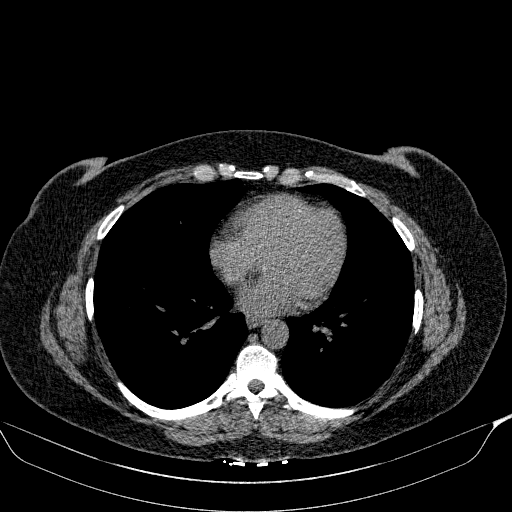
[im 230/441  lung]
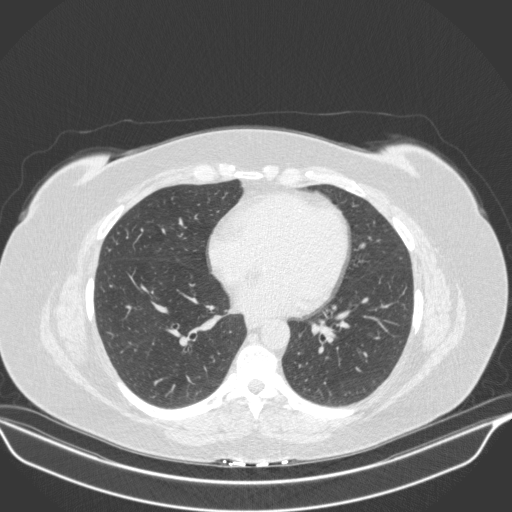
[im 268/441  lung]
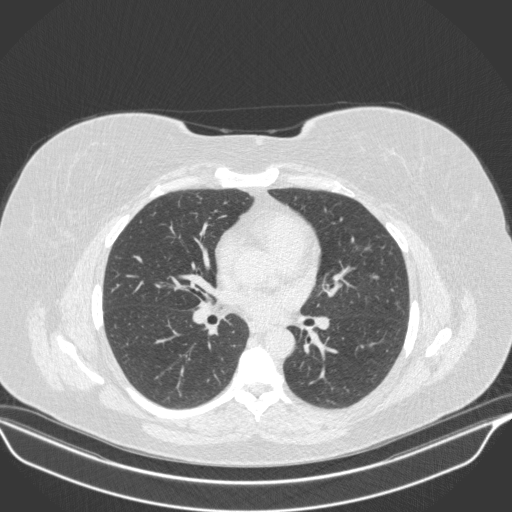
[im 307/441  lung]
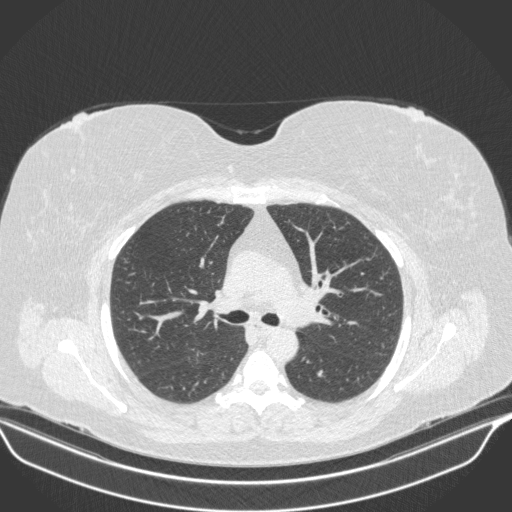
[im 326/441  lung]
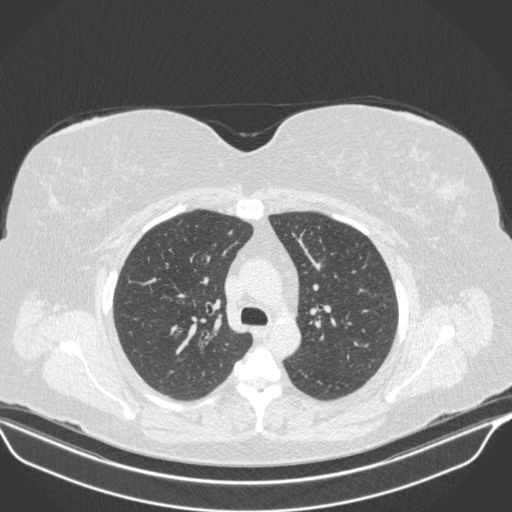
[im 345/441  mediastinal]
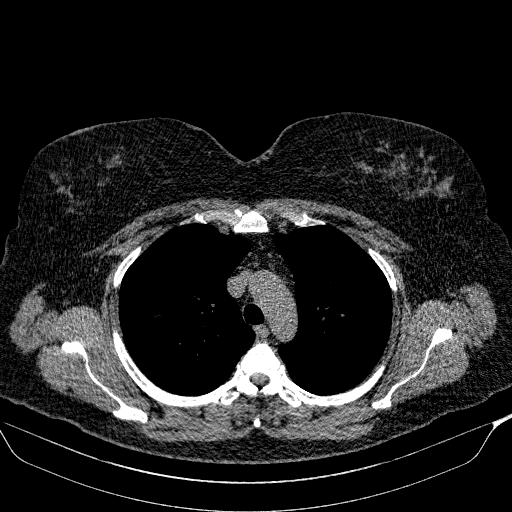
[im 345/441  lung]
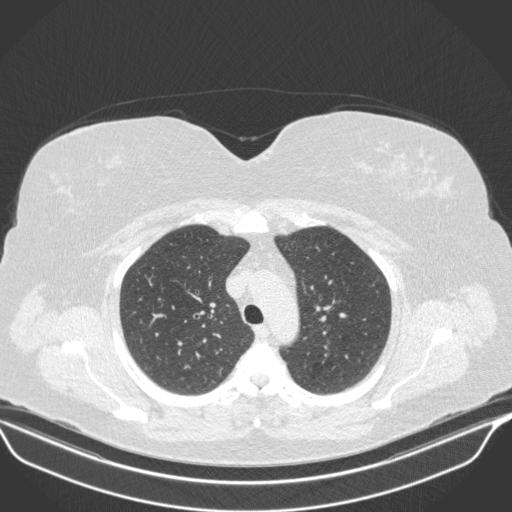
[im 383/441  lung]
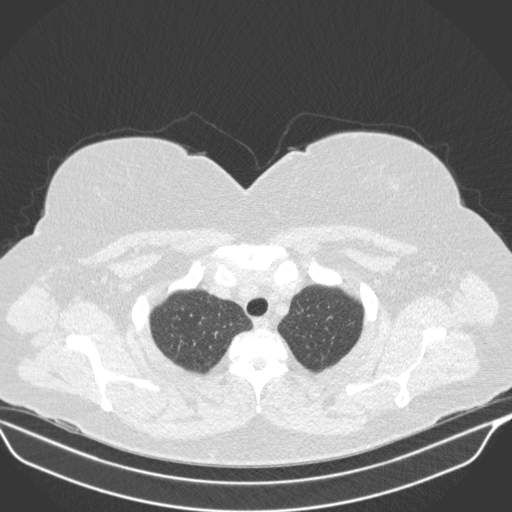
[im 421/441  lung]
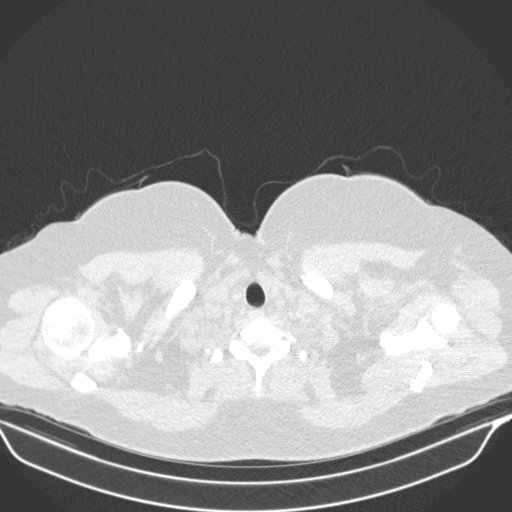

[15 of 33 positions shown; findings below may reference images not displayed]

FINDINGS: Cardiovascular: The heart size is normal. No substantial pericardial
effusion. No thoracic aortic aneurysm.

Mediastinum/Nodes: No mediastinal lymphadenopathy. No evidence for
gross hilar lymphadenopathy although assessment is limited by the
lack of intravenous contrast on the current study. The esophagus has
normal imaging features. There is no axillary lymphadenopathy.

Lungs/Pleura: Centrilobular emphsyema noted. No suspicious pulmonary
nodule or mass. No focal airspace consolidation. No pleural
effusion.

Upper Abdomen: Unremarkable.

Musculoskeletal: No worrisome lytic or sclerotic osseous
abnormality.
IMPRESSION: 1. Lung-RADS 1, negative. Continue annual screening with low-dose
chest CT without contrast in 12 months.
2.  Emphysema. ([id number])

## 2022-05-15 ENCOUNTER — Other Ambulatory Visit: Payer: Self-pay | Admitting: Neurology

## 2022-05-15 MED ORDER — AMPHETAMINE-DEXTROAMPHETAMINE 10 MG PO TABS: 10.0000 mg | ORAL_TABLET | Freq: Every day | ORAL | 0 refills | Status: DC

## 2022-06-11 DIAGNOSIS — R509 Fever, unspecified: Secondary | ICD-10-CM | POA: Diagnosis not present

## 2022-06-11 DIAGNOSIS — R0981 Nasal congestion: Secondary | ICD-10-CM | POA: Diagnosis not present

## 2022-06-11 DIAGNOSIS — J069 Acute upper respiratory infection, unspecified: Secondary | ICD-10-CM | POA: Diagnosis not present

## 2022-06-11 DIAGNOSIS — R051 Acute cough: Secondary | ICD-10-CM | POA: Diagnosis not present

## 2022-06-12 ENCOUNTER — Other Ambulatory Visit: Payer: Self-pay

## 2022-06-12 DIAGNOSIS — G35 Multiple sclerosis: Secondary | ICD-10-CM

## 2022-06-12 MED ORDER — FINGOLIMOD HCL 0.5 MG PO CAPS: 0.5000 mg | ORAL_CAPSULE | Freq: Every day | ORAL | 3 refills | Status: DC

## 2022-06-17 ENCOUNTER — Other Ambulatory Visit: Payer: Self-pay | Admitting: Neurology

## 2022-06-17 MED ORDER — AMPHETAMINE-DEXTROAMPHETAMINE 10 MG PO TABS: 10.0000 mg | ORAL_TABLET | Freq: Every day | ORAL | 0 refills | Status: DC

## 2022-06-17 NOTE — Telephone Encounter (Signed)
Last seen on 12/13/2021 Follow up scheduled on 06/20/22 Last filled on 05/15/22 # 30 tablets  Rx pending to be signed

## 2022-06-18 DIAGNOSIS — D485 Neoplasm of uncertain behavior of skin: Secondary | ICD-10-CM | POA: Diagnosis not present

## 2022-06-18 DIAGNOSIS — J439 Emphysema, unspecified: Secondary | ICD-10-CM | POA: Diagnosis not present

## 2022-06-18 DIAGNOSIS — F32A Depression, unspecified: Secondary | ICD-10-CM | POA: Diagnosis not present

## 2022-06-18 DIAGNOSIS — M79675 Pain in left toe(s): Secondary | ICD-10-CM | POA: Diagnosis not present

## 2022-06-18 DIAGNOSIS — E538 Deficiency of other specified B group vitamins: Secondary | ICD-10-CM | POA: Diagnosis not present

## 2022-06-18 DIAGNOSIS — I10 Essential (primary) hypertension: Secondary | ICD-10-CM | POA: Diagnosis not present

## 2022-06-18 DIAGNOSIS — Z87891 Personal history of nicotine dependence: Secondary | ICD-10-CM | POA: Diagnosis not present

## 2022-06-18 DIAGNOSIS — G35 Multiple sclerosis: Secondary | ICD-10-CM | POA: Diagnosis not present

## 2022-06-18 DIAGNOSIS — E559 Vitamin D deficiency, unspecified: Secondary | ICD-10-CM | POA: Diagnosis not present

## 2022-06-18 DIAGNOSIS — Z79899 Other long term (current) drug therapy: Secondary | ICD-10-CM | POA: Diagnosis not present

## 2022-06-18 DIAGNOSIS — Z0000 Encounter for general adult medical examination without abnormal findings: Secondary | ICD-10-CM | POA: Diagnosis not present

## 2022-06-18 DIAGNOSIS — E7800 Pure hypercholesterolemia, unspecified: Secondary | ICD-10-CM | POA: Diagnosis not present

## 2022-06-19 NOTE — Progress Notes (Unsigned)
Patient: Sue Sanchez Date of Birth: [birth year]  Reason for Visit: Follow up History from: Patient Primary Neurologist: Sater   ASSESSMENT AND PLAN 52 y.o. year old female   55.  Relapsing remitting multiple sclerosis -Kowsar is a delightful patient. remains overall stable, diagnosed in 2014, MRIs from 2016-2022 showed low plaque burden without any changes -Continue Gilenya for now, she mentions Dr. Felecia Shelling discussed coming off Gilenya in the future as she ages  -Check routine labs today on Gilenya -MRI of the brain with and without contrast for routine surveillance -Continue Lexapro 10 mg daily for the mood -Continue Adderall 10 mg daily for chronic fatigue -Continue Nurtec 75 mg as needed for acute migraine headache -Follow up in 6 months or sooner if needed  HISTORY OF PRESENT ILLNESS: Today 06/20/22 Remains on Gilenya. Feels MS is doing ok. Has occasional "quirks", sharp pains in different places, feet go numb, feels hot inside, doesn't last 24 hours. Stopped Provigil, switched to Adderall 10 mg daily, it helps. She takes care of 18 month grand baby during the day. She smokes, trying to cut back, smokes pack a day. Takes Vitamin D. Takes Flexeril as needed, none this month. Lexapro 10 mg daily. Nurtec as needed for migraines, few times a month, it helps very well for aura at onset, with ibuprofen.  Vision is fine, wears glasses. Numbness in fingers come and goes, especially if hold neck in same place to long, uses gel to neck, resolves, neck massager. Still on urinary leaking, doesn't want any pills. Uses pads. Gait is okay, no falls lately. PCP referred to podiatrist left 4th toe pain. Eating well, no more trouble swallowing.   HISTORY  12/13/21 Dr. Felecia Shelling MS history: She was diagnosed with MS in 2014 after presenting with left sided numbness.   She was placed on Copaxone but had issues with the injections.  She then switched to Tecfidera but had GI issues/gastritis.   She then  swithced to [place] around since 2016.    She has not had any major exacerbation though symptoms fluctuate.   Currently, she feels gait is slow and she feels balance is off.  She notes her fingertips are numb.   She notes numbness in her legs if she sits a long time.    She reports some issues with swallowing.  Speech is fine.    She has urinary urgency and occasional leakage.    Vision does well for the most part -- but blurriness occurs when she looks at a computer screen a long time.      She has fatigue and is on modafinil.     She had previously been on Adderall and felt MS fatigue as well as attention/focus she did better   She has migraines headaches and gets them 2 times a week.   Ibuprofen helps if mild    Nurtec works better than Relpax if Ibu does not work.          IMAGING MRI brain 08/04/2021 , 08/14/2019 09/08/2014 show the same few T2/FLAIR hyperintense foci in the juxtacortical and periventricular white matter of the cerebral hemispheres in a pattern configuration consistent with chronic demyelinating plaque associated with multiple sclerosis.  Infratentorial white matter was normal.  None of the foci appear to be acute.  The overall plaque burden is light   MRI cervical spine 08/04/2020 showed no spinal plaques.  She has some degenerative changes with mild spinal stenosis at C3-C4.  REVIEW OF SYSTEMS: Out of a complete  14 system review of symptoms, the patient complains only of the following symptoms, and all other reviewed systems are negative.  See HPI  ALLERGIES: No Known Allergies  HOME MEDICATIONS: Outpatient Medications Prior to Visit  Medication Sig Dispense Refill   amphetamine-dextroamphetamine (ADDERALL) 10 MG tablet Take 1 tablet (10 mg total) by mouth daily with breakfast. 30 tablet 0   budesonide-formoterol (SYMBICORT) 160-4.5 MCG/ACT inhaler Inhale 2 puffs into the lungs 2 (two) times daily.     Cholecalciferol (VITAMIN D-3) 125 MCG (5000 UT) TABS Take 5,000 Units  by mouth daily. 90 tablet 0   Cyanocobalamin (B-12 PO) Take 1 tablet by mouth daily.      cyclobenzaprine (FLEXERIL) 5 MG tablet TAKE 2 TABLETS BY MOUTH AT  BEDTIME AS NEEDED FOR  MUSCLE SPASMS 60 tablet 5   escitalopram (LEXAPRO) 10 MG tablet TAKE 1 TABLET BY MOUTH DAILY 90 tablet 3   Esomeprazole Magnesium (NEXIUM PO) Take 1 tablet by mouth 2 (two) times daily.     Fingolimod HCl (GILENYA) 0.5 MG CAPS Take 1 capsule (0.5 mg total) by mouth daily. 30 capsule 3   hydrochlorothiazide (HYDRODIURIL) 25 MG tablet Take 25 mg by mouth daily.     ibuprofen (ADVIL,MOTRIN) 200 MG tablet Take 3 tablets (600 mg total) by mouth every 6 (six) hours as needed for headache or moderate pain. 30 tablet 0   PROVENTIL HFA 108 (90 BASE) MCG/ACT inhaler Inhale 2 puffs into the lungs every 6 (six) hours as needed for wheezing or shortness of breath.      Rimegepant Sulfate (NURTEC) 75 MG TBDP Take 75 mg by mouth as needed (Take 1 tablet at headache onset, max is 1 tablet in 24 hours). 16 tablet 11   rosuvastatin (CRESTOR) 10 MG tablet Take 10 mg by mouth at bedtime.     amphetamine-dextroamphetamine (ADDERALL) 10 MG tablet Take 1 tablet (10 mg total) by mouth daily with breakfast. (Patient not taking: Reported on 06/20/2022) 30 tablet 0   modafinil (PROVIGIL) 100 MG tablet TAKE 1 TABLET BY MOUTH EVERY DAY (Patient not taking: Reported on 06/20/2022) 30 tablet 3   No facility-administered medications prior to visit.    PAST MEDICAL HISTORY: Past Medical History:  Diagnosis Date   Abnormality of gait 03/03/2015   B12 deficiency 08/31/2014   Chronic fatigue 08/31/2014   Classic migraine 01/12/2016   COPD (chronic obstructive pulmonary disease) (HCC)    Gastroesophageal reflux disease    Migraine    MS (multiple sclerosis) (HCC)    Numbness    bilateral lower extremity   Obesity    Osteoarthritis    diagnosed by orthopedics   Vitamin D deficiency     PAST SURGICAL HISTORY: Past Surgical History:  Procedure  Laterality Date   CESAREAN SECTION     COSMETIC SURGERY     lower right leg   FOOT SURGERY     Club feet   NASAL SINUS SURGERY     10/23/2016,,  dev septum, other   TUBAL LIGATION Bilateral     FAMILY HISTORY: Family History  Problem Relation Age of Onset   Diabetes Mother    Bipolar disorder Sister    Bipolar disorder Brother    Cancer Maternal Grandmother    Colon cancer Paternal Grandmother    COPD Paternal Grandfather    Heart disease Paternal Grandfather     SOCIAL HISTORY: Social History   Socioeconomic History   Marital status: Divorced    Spouse name: Not on file  Number of children: 2   Years of education: college   Highest education level: Not on file  Occupational History   Occupation: Unemployed  Tobacco Use   Smoking status: Every Day    Packs/day: 0.50    Years: 30.00    Total pack years: 15.00    Types: Cigarettes   Smokeless tobacco: Never  Substance and Sexual Activity   Alcohol use: No    Alcohol/week: 0.0 standard drinks of alcohol   Drug use: No   Sexual activity: Yes  Other Topics Concern   Not on file  Social History Narrative   Patient is Divorced with 2 children.   Patient has a college education.   Patient is right handed.   Caffeine Intake: 6 or more sodas daily      Patient has a dog named Butters.   Social Determinants of Health   Financial Resource Strain: Not on file  Food Insecurity: Not on file  Transportation Needs: Not on file  Physical Activity: Not on file  Stress: Not on file  Social Connections: Not on file  Intimate Partner Violence: Not on file    PHYSICAL EXAM  Vitals:   06/20/22 1410 06/20/22 1414  BP: (!) 178/104 (!) 151/84  Pulse: 91 88  Weight: 206 lb (93.4 kg) 206 lb (93.4 kg)  Height: '5\' 3"'$  (1.6 m) '5\' 3"'$  (1.6 m)   Body mass index is 36.49 kg/m.  Generalized: Well developed, in no acute distress  Neurological examination  Mentation: Alert oriented to time, place, history taking. Follows all  commands speech and language fluent Cranial nerve II-XII: Pupils were equal round reactive to light. Extraocular movements were full, visual field were full on confrontational test. Facial sensation and strength were normal. Head turning and shoulder shrug  were normal and symmetric. Motor: The motor testing reveals 5 over 5 strength of all 4 extremities. Good symmetric motor tone is noted throughout.  Sensory: Sensory testing is intact to soft touch on all 4 extremities. No evidence of extinction is noted.  Coordination: Cerebellar testing reveals good finger-nose-finger and heel-to-shin bilaterally.  Gait and station: Gait is normal. Reflexes: Deep tendon reflexes are symmetric and normal bilaterally.   DIAGNOSTIC DATA (LABS, IMAGING, TESTING) - I reviewed patient records, labs, notes, testing and imaging myself where available.  Lab Results  Component Value Date   WBC 7.5 12/13/2021   HGB 15.5 12/13/2021   HCT 44.5 12/13/2021   MCV 89 12/13/2021   PLT 339 12/13/2021      Component Value Date/Time   NA 138 06/12/2021 1528   K 4.2 06/12/2021 1528   CL 102 06/12/2021 1528   CO2 22 06/12/2021 1528   GLUCOSE 91 06/12/2021 1528   BUN 14 06/12/2021 1528   CREATININE 0.85 06/12/2021 1528   CALCIUM 9.6 06/12/2021 1528   PROT 6.8 06/12/2021 1528   ALBUMIN 4.6 06/12/2021 1528   AST 14 06/12/2021 1528   ALT 17 06/12/2021 1528   ALKPHOS 126 (H) 06/12/2021 1528   BILITOT 0.3 06/12/2021 1528   GFRNONAA 72 01/25/2020 1606   GFRAA 83 01/25/2020 1606   No results found for: "CHOL", "HDL", "LDLCALC", "LDLDIRECT", "TRIG", "CHOLHDL" No results found for: "HGBA1C" Lab Results  Component Value Date   VITAMINB12 1,130 01/13/2019   No results found for: "TSH"  Butler Denmark, AGNP-C, DNP 06/20/2022, 2:29 PM [facility] [address] [place], [place] [address] [phone]

## 2022-06-20 ENCOUNTER — Ambulatory Visit (INDEPENDENT_AMBULATORY_CARE_PROVIDER_SITE_OTHER): Payer: 59 | Admitting: Neurology

## 2022-06-20 ENCOUNTER — Encounter: Payer: Self-pay | Admitting: Neurology

## 2022-06-20 ENCOUNTER — Telehealth: Payer: Self-pay | Admitting: Neurology

## 2022-06-20 VITALS — BP 151/84 | HR 88 | Ht 63.0 in | Wt 206.0 lb

## 2022-06-20 DIAGNOSIS — R269 Unspecified abnormalities of gait and mobility: Secondary | ICD-10-CM

## 2022-06-20 DIAGNOSIS — G35 Multiple sclerosis: Secondary | ICD-10-CM

## 2022-06-20 DIAGNOSIS — G43109 Migraine with aura, not intractable, without status migrainosus: Secondary | ICD-10-CM

## 2022-06-20 DIAGNOSIS — R5382 Chronic fatigue, unspecified: Secondary | ICD-10-CM

## 2022-06-20 DIAGNOSIS — G35D Multiple sclerosis, unspecified: Secondary | ICD-10-CM

## 2022-06-20 MED ORDER — FINGOLIMOD HCL 0.5 MG PO CAPS: 0.5000 mg | ORAL_CAPSULE | Freq: Every day | ORAL | 3 refills | Status: DC

## 2022-06-20 MED ORDER — NURTEC 75 MG PO TBDP: 75.0000 mg | ORAL_TABLET | ORAL | 11 refills | Status: AC | PRN

## 2022-06-20 MED ORDER — ESCITALOPRAM OXALATE 10 MG PO TABS: 10.0000 mg | ORAL_TABLET | Freq: Every day | ORAL | 3 refills | Status: DC

## 2022-06-20 NOTE — Patient Instructions (Signed)
Check labs, Check MRI  Continue current medications  See you back in 6 months

## 2022-06-20 NOTE — Telephone Encounter (Signed)
[employer] medicare/[employer] medicaid NPR sent to GI [phone]

## 2022-06-21 LAB — COMPREHENSIVE METABOLIC PANEL
ALT: 33 IU/L — ABNORMAL HIGH (ref 0–32)
AST: 27 IU/L (ref 0–40)
Albumin/Globulin Ratio: 1.8 (ref 1.2–2.2)
Albumin: 4.3 g/dL (ref 3.8–4.9)
Alkaline Phosphatase: 165 IU/L — ABNORMAL HIGH (ref 44–121)
BUN/Creatinine Ratio: 13 (ref 9–23)
BUN: 11 mg/dL (ref 6–24)
Bilirubin Total: 0.3 mg/dL (ref 0.0–1.2)
CO2: 25 mmol/L (ref 20–29)
Calcium: 9.6 mg/dL (ref 8.7–10.2)
Chloride: 97 mmol/L (ref 96–106)
Creatinine, Ser: 0.88 mg/dL (ref 0.57–1.00)
Globulin, Total: 2.4 g/dL (ref 1.5–4.5)
Glucose: 84 mg/dL (ref 70–99)
Potassium: 3.4 mmol/L — ABNORMAL LOW (ref 3.5–5.2)
Sodium: 139 mmol/L (ref 134–144)
Total Protein: 6.7 g/dL (ref 6.0–8.5)
eGFR: 79 mL/min/{1.73_m2} (ref >59)

## 2022-06-21 LAB — CBC WITH DIFFERENTIAL/PLATELET
Basophils Absolute: 0 10*3/uL (ref 0.0–0.2)
Basos: 0 %
EOS (ABSOLUTE): 0.2 10*3/uL (ref 0.0–0.4)
Eos: 2 %
Hematocrit: 45.5 % (ref 34.0–46.6)
Hemoglobin: 15 g/dL (ref 11.1–15.9)
Immature Grans (Abs): 0.1 10*3/uL (ref 0.0–0.1)
Immature Granulocytes: 1 %
Lymphocytes Absolute: 0.4 10*3/uL — ABNORMAL LOW (ref 0.7–3.1)
Lymphs: 4 %
MCH: 30.3 pg (ref 26.6–33.0)
MCHC: 33 g/dL (ref 31.5–35.7)
MCV: 92 fL (ref 79–97)
Monocytes Absolute: 1 10*3/uL — ABNORMAL HIGH (ref 0.1–0.9)
Monocytes: 10 %
Neutrophils Absolute: 8 10*3/uL — ABNORMAL HIGH (ref 1.4–7.0)
Neutrophils: 83 %
Platelets: 403 10*3/uL (ref 150–450)
RBC: 4.95 x10E6/uL (ref 3.77–5.28)
RDW: 12.8 % (ref 11.7–15.4)
WBC: 9.7 10*3/uL (ref 3.4–10.8)

## 2022-06-24 ENCOUNTER — Other Ambulatory Visit: Payer: Self-pay | Admitting: Family Medicine

## 2022-06-24 DIAGNOSIS — Z1231 Encounter for screening mammogram for malignant neoplasm of breast: Secondary | ICD-10-CM

## 2022-06-25 ENCOUNTER — Ambulatory Visit
Admission: RE | Admit: 2022-06-25 | Discharge: 2022-06-25 | Disposition: A | Discharge status: Home / Self Care | Payer: 59 | Source: Ambulatory Visit | Attending: Family Medicine | Admitting: Family Medicine

## 2022-06-25 DIAGNOSIS — Z1231 Encounter for screening mammogram for malignant neoplasm of breast: Secondary | ICD-10-CM | POA: Diagnosis not present

## 2022-06-29 ENCOUNTER — Ambulatory Visit
Admission: RE | Admit: 2022-06-29 | Discharge: 2022-06-29 | Disposition: A | Discharge status: Home / Self Care | Payer: 59 | Source: Ambulatory Visit | Attending: Neurology | Admitting: Neurology

## 2022-06-29 DIAGNOSIS — G35 Multiple sclerosis: Secondary | ICD-10-CM | POA: Diagnosis not present

## 2022-06-29 MED ORDER — GADOPICLENOL 0.5 MMOL/ML IV SOLN
9.0000 mL | Freq: Once | INTRAVENOUS | Status: AC | PRN
  Administered 2022-06-29: 9 mL via INTRAVENOUS

## 2022-07-01 ENCOUNTER — Telehealth: Payer: Self-pay

## 2022-07-01 ENCOUNTER — Other Ambulatory Visit: Payer: Self-pay | Admitting: Family Medicine

## 2022-07-01 DIAGNOSIS — Z87891 Personal history of nicotine dependence: Secondary | ICD-10-CM

## 2022-07-01 NOTE — Telephone Encounter (Signed)
Patient called.  Left message for patient to call back.

## 2022-07-03 DIAGNOSIS — M2060 Acquired deformities of toe(s), unspecified, unspecified foot: Secondary | ICD-10-CM | POA: Diagnosis not present

## 2022-07-03 DIAGNOSIS — M79675 Pain in left toe(s): Secondary | ICD-10-CM | POA: Diagnosis not present

## 2022-07-03 DIAGNOSIS — S90122A Contusion of left lesser toe(s) without damage to nail, initial encounter: Secondary | ICD-10-CM | POA: Diagnosis not present

## 2022-07-09 DIAGNOSIS — D485 Neoplasm of uncertain behavior of skin: Secondary | ICD-10-CM | POA: Diagnosis not present

## 2022-07-09 DIAGNOSIS — L821 Other seborrheic keratosis: Secondary | ICD-10-CM | POA: Diagnosis not present

## 2022-07-10 ENCOUNTER — Other Ambulatory Visit: Payer: Self-pay

## 2022-07-10 MED ORDER — CYCLOBENZAPRINE HCL 5 MG PO TABS: ORAL_TABLET | ORAL | 5 refills | Status: DC

## 2022-07-15 ENCOUNTER — Other Ambulatory Visit: Payer: Self-pay | Admitting: Neurology

## 2022-07-15 MED ORDER — AMPHETAMINE-DEXTROAMPHETAMINE 10 MG PO TABS: 10.0000 mg | ORAL_TABLET | Freq: Every day | ORAL | 0 refills | Status: DC

## 2022-07-15 NOTE — Telephone Encounter (Signed)
Last seen on 06/20/22 Follow up scheduled on 01/07/23 Last filled on 06/17/22 # 30 tablets Rx pending to be signed

## 2022-07-29 ENCOUNTER — Ambulatory Visit
Admission: RE | Admit: 2022-07-29 | Discharge: 2022-07-29 | Disposition: A | Discharge status: Home / Self Care | Payer: 59 | Source: Ambulatory Visit | Attending: Family Medicine | Admitting: Family Medicine

## 2022-07-29 DIAGNOSIS — F17210 Nicotine dependence, cigarettes, uncomplicated: Secondary | ICD-10-CM | POA: Diagnosis not present

## 2022-07-29 DIAGNOSIS — Z87891 Personal history of nicotine dependence: Secondary | ICD-10-CM

## 2022-08-14 DIAGNOSIS — Z1211 Encounter for screening for malignant neoplasm of colon: Secondary | ICD-10-CM | POA: Diagnosis not present

## 2022-08-14 DIAGNOSIS — Z1212 Encounter for screening for malignant neoplasm of rectum: Secondary | ICD-10-CM | POA: Diagnosis not present

## 2022-08-16 ENCOUNTER — Other Ambulatory Visit: Payer: Self-pay | Admitting: Neurology

## 2022-08-19 ENCOUNTER — Encounter: Payer: Self-pay | Admitting: Neurology

## 2022-08-19 MED ORDER — AMPHETAMINE-DEXTROAMPHETAMINE 10 MG PO TABS: 10.0000 mg | ORAL_TABLET | Freq: Every day | ORAL | 0 refills | Status: DC

## 2022-08-19 NOTE — Telephone Encounter (Signed)
Last seen on 06/20/22 per note "-Continue Adderall 10 mg daily for chronic fatigue " Follow up scheduled on 01/07/23 Rx last filled on 07/15/22 #30 tablets (30 days) Rx pending to signed

## 2022-08-23 LAB — COLOGUARD: COLOGUARD: NEGATIVE

## 2022-09-15 ENCOUNTER — Other Ambulatory Visit: Payer: Self-pay | Admitting: Neurology

## 2022-09-16 MED ORDER — AMPHETAMINE-DEXTROAMPHETAMINE 10 MG PO TABS: 10.0000 mg | ORAL_TABLET | Freq: Every day | ORAL | 0 refills | Status: DC

## 2022-10-14 ENCOUNTER — Other Ambulatory Visit: Payer: Self-pay | Admitting: Neurology

## 2022-10-15 MED ORDER — AMPHETAMINE-DEXTROAMPHETAMINE 10 MG PO TABS: 10.0000 mg | ORAL_TABLET | Freq: Every day | ORAL | 0 refills | Status: DC

## 2022-10-15 NOTE — Telephone Encounter (Signed)
Pt last seen on 12/13/21 Follow up scheduled on 06/20/22 Last filled on 09/16/22 #30 tablets (30 day supply) Rx pending to be signed

## 2022-10-28 ENCOUNTER — Telehealth: Payer: Self-pay

## 2022-11-05 NOTE — Telephone Encounter (Signed)
Error

## 2022-11-07 ENCOUNTER — Other Ambulatory Visit: Payer: Self-pay

## 2022-11-07 DIAGNOSIS — G35 Multiple sclerosis: Secondary | ICD-10-CM

## 2022-11-07 MED ORDER — FINGOLIMOD HCL 0.5 MG PO CAPS
0.5000 mg | ORAL_CAPSULE | Freq: Every day | ORAL | 3 refills | Status: DC
Start: 2022-11-07 — End: 2023-11-18

## 2022-11-07 NOTE — Progress Notes (Signed)
Rx sent to pharmacy. Refills requested by [employer].

## 2022-11-13 ENCOUNTER — Other Ambulatory Visit: Payer: Self-pay | Admitting: Neurology

## 2022-11-13 MED ORDER — AMPHETAMINE-DEXTROAMPHETAMINE 10 MG PO TABS: 10.0000 mg | ORAL_TABLET | Freq: Every day | ORAL | 0 refills | Status: DC

## 2022-11-13 NOTE — Telephone Encounter (Signed)
Last seen on 06/20/22 Follow up scheduled on 01/07/23 Last filled on 10/15/22 #30 tablets (30 day supply) Rx pending to be signed

## 2022-12-17 ENCOUNTER — Other Ambulatory Visit: Payer: Self-pay | Admitting: Neurology

## 2022-12-17 MED ORDER — AMPHETAMINE-DEXTROAMPHETAMINE 10 MG PO TABS: 10.0000 mg | ORAL_TABLET | Freq: Every day | ORAL | 0 refills | Status: DC

## 2022-12-17 NOTE — Telephone Encounter (Signed)
Last seen on 37/24 Follow up scheduled on 01/07/23 Last filled on 11/13/22 Rx pending to be signed

## 2022-12-19 DIAGNOSIS — E7800 Pure hypercholesterolemia, unspecified: Secondary | ICD-10-CM | POA: Diagnosis not present

## 2022-12-19 DIAGNOSIS — E559 Vitamin D deficiency, unspecified: Secondary | ICD-10-CM | POA: Diagnosis not present

## 2022-12-19 DIAGNOSIS — J439 Emphysema, unspecified: Secondary | ICD-10-CM | POA: Diagnosis not present

## 2022-12-19 DIAGNOSIS — Z79899 Other long term (current) drug therapy: Secondary | ICD-10-CM | POA: Diagnosis not present

## 2022-12-19 DIAGNOSIS — M5412 Radiculopathy, cervical region: Secondary | ICD-10-CM | POA: Diagnosis not present

## 2022-12-19 DIAGNOSIS — G35 Multiple sclerosis: Secondary | ICD-10-CM | POA: Diagnosis not present

## 2022-12-19 DIAGNOSIS — E538 Deficiency of other specified B group vitamins: Secondary | ICD-10-CM | POA: Diagnosis not present

## 2022-12-19 DIAGNOSIS — I10 Essential (primary) hypertension: Secondary | ICD-10-CM | POA: Diagnosis not present

## 2023-01-02 NOTE — Progress Notes (Deleted)
Patient: Sue Sanchez Date of Birth: [birth year]  Reason for Visit: Follow up History from: Patient Primary Neurologist: Sater   ASSESSMENT AND PLAN 52 y.o. year old female   1.  Relapsing remitting multiple sclerosis -Cebrina is a delightful patient. remains overall stable, diagnosed in 2014, MRIs from 2016-2022 showed low plaque burden without any changes -Continue Gilenya for now, she mentions Dr. Epimenio Foot discussed coming off Gilenya in the future as she ages  -Check routine labs today on Gilenya -MRI of the brain with and without contrast for routine surveillance -Continue Lexapro 10 mg daily for the mood -Continue Adderall 10 mg daily for chronic fatigue -Continue Nurtec 75 mg as needed for acute migraine headache -Follow up in 6 months or sooner if needed  HISTORY OF PRESENT ILLNESS: Today 01/02/23   06/20/22 SS: Remains on Gilenya. Feels MS is doing ok. Has occasional "quirks", sharp pains in different places, feet go numb, feels hot inside, doesn't last 24 hours. Stopped Provigil, switched to Adderall 10 mg daily, it helps. She takes care of 18 month grand baby during the day. She smokes, trying to cut back, smokes pack a day. Takes Vitamin D. Takes Flexeril as needed, none this month. Lexapro 10 mg daily. Nurtec as needed for migraines, few times a month, it helps very well for aura at onset, with ibuprofen.  Vision is fine, wears glasses. Numbness in fingers come and goes, especially if hold neck in same place to long, uses gel to neck, resolves, neck massager. Still on urinary leaking, doesn't want any pills. Uses pads. Gait is okay, no falls lately. PCP referred to podiatrist left 4th toe pain. Eating well, no more trouble swallowing.   HISTORY  12/13/21 Dr. Epimenio Foot MS history: She was diagnosed with MS in 2014 after presenting with left sided numbness.   She was placed on Copaxone but had issues with the injections.  She then switched to Tecfidera but had GI  issues/gastritis.   She then swithced to Gilenya around since 2016.    She has not had any major exacerbation though symptoms fluctuate.   Currently, she feels gait is slow and she feels balance is off.  She notes her fingertips are numb.   She notes numbness in her legs if she sits a long time.    She reports some issues with swallowing.  Speech is fine.    She has urinary urgency and occasional leakage.    Vision does well for the most part -- but blurriness occurs when she looks at a computer screen a long time.      She has fatigue and is on modafinil.     She had previously been on Adderall and felt MS fatigue as well as attention/focus she did better   She has migraines headaches and gets them 2 times a week.   Ibuprofen helps if mild    Nurtec works better than Relpax if Ibu does not work.          IMAGING MRI brain 08/04/2021 , 08/14/2019 09/08/2014 show the same few T2/FLAIR hyperintense foci in the juxtacortical and periventricular white matter of the cerebral hemispheres in a pattern configuration consistent with chronic demyelinating plaque associated with multiple sclerosis.  Infratentorial white matter was normal.  None of the foci appear to be acute.  The overall plaque burden is light   MRI cervical spine 08/04/2020 showed no spinal plaques.  She has some degenerative changes with mild spinal stenosis at C3-C4.  REVIEW OF SYSTEMS:  Out of a complete 14 system review of symptoms, the patient complains only of the following symptoms, and all other reviewed systems are negative.  See HPI  ALLERGIES: No Known Allergies  HOME MEDICATIONS: Outpatient Medications Prior to Visit  Medication Sig Dispense Refill   amphetamine-dextroamphetamine (ADDERALL) 10 MG tablet Take 1 tablet (10 mg total) by mouth daily with breakfast. 30 tablet 0   budesonide-formoterol (SYMBICORT) 160-4.5 MCG/ACT inhaler Inhale 2 puffs into the lungs 2 (two) times daily.     Cholecalciferol (VITAMIN D-3) 125 MCG  (5000 UT) TABS Take 5,000 Units by mouth daily. 90 tablet 0   Cyanocobalamin (B-12 PO) Take 1 tablet by mouth daily.      cyclobenzaprine (FLEXERIL) 5 MG tablet TAKE 2 TABLETS BY MOUTH AT  BEDTIME AS NEEDED FOR  MUSCLE SPASMS 60 tablet 5   escitalopram (LEXAPRO) 10 MG tablet Take 1 tablet (10 mg total) by mouth daily. 90 tablet 3   Esomeprazole Magnesium (NEXIUM PO) Take 1 tablet by mouth 2 (two) times daily.     Fingolimod HCl (GILENYA) 0.5 MG CAPS Take 1 capsule (0.5 mg total) by mouth daily. 90 capsule 3   hydrochlorothiazide (HYDRODIURIL) 25 MG tablet Take 25 mg by mouth daily.     ibuprofen (ADVIL,MOTRIN) 200 MG tablet Take 3 tablets (600 mg total) by mouth every 6 (six) hours as needed for headache or moderate pain. 30 tablet 0   PROVENTIL HFA 108 (90 BASE) MCG/ACT inhaler Inhale 2 puffs into the lungs every 6 (six) hours as needed for wheezing or shortness of breath.      Rimegepant Sulfate (NURTEC) 75 MG TBDP Take 1 tablet (75 mg total) by mouth as needed (Take 1 tablet at headache onset, max is 1 tablet in 24 hours). 16 tablet 11   rosuvastatin (CRESTOR) 10 MG tablet Take 10 mg by mouth at bedtime.     No facility-administered medications prior to visit.    PAST MEDICAL HISTORY: Past Medical History:  Diagnosis Date   Abnormality of gait 03/03/2015   B12 deficiency 08/31/2014   Chronic fatigue 08/31/2014   Classic migraine 01/12/2016   COPD (chronic obstructive pulmonary disease) (HCC)    Gastroesophageal reflux disease    Migraine    MS (multiple sclerosis) (HCC)    Numbness    bilateral lower extremity   Obesity    Osteoarthritis    diagnosed by orthopedics   Vitamin D deficiency     PAST SURGICAL HISTORY: Past Surgical History:  Procedure Laterality Date   CESAREAN SECTION     COSMETIC SURGERY     lower right leg   FOOT SURGERY     Club feet   NASAL SINUS SURGERY     10/23/2016,,  dev septum, other   TUBAL LIGATION Bilateral     FAMILY HISTORY: Family History   Problem Relation Age of Onset   Diabetes Mother    Bipolar disorder Sister    Bipolar disorder Brother    Cancer Maternal Grandmother    Colon cancer Paternal Grandmother    COPD Paternal Grandfather    Heart disease Paternal Grandfather     SOCIAL HISTORY: Social History   Socioeconomic History   Marital status: Divorced    Spouse name: Not on file   Number of children: 2   Years of education: college   Highest education level: Not on file  Occupational History   Occupation: Unemployed  Tobacco Use   Smoking status: Every Day    Current packs/day:  0.50    Average packs/day: 0.5 packs/day for 30.0 years (15.0 ttl pk-yrs)    Types: Cigarettes   Smokeless tobacco: Never  Substance and Sexual Activity   Alcohol use: No    Alcohol/week: 0.0 standard drinks of alcohol   Drug use: No   Sexual activity: Yes  Other Topics Concern   Not on file  Social History Narrative   Patient is Divorced with 2 children.   Patient has a college education.   Patient is right handed.   Caffeine Intake: 6 or more sodas daily      Patient has a dog named Butters.   Social Determinants of Health   Financial Resource Strain: Not on file  Food Insecurity: Not on file  Transportation Needs: Not on file  Physical Activity: Not on file  Stress: Not on file  Social Connections: Not on file  Intimate Partner Violence: Not on file    PHYSICAL EXAM  There were no vitals filed for this visit.  There is no height or weight on file to calculate BMI.  Generalized: Well developed, in no acute distress  Neurological examination  Mentation: Alert oriented to time, place, history taking. Follows all commands speech and language fluent Cranial nerve II-XII: Pupils were equal round reactive to light. Extraocular movements were full, visual field were full on confrontational test. Facial sensation and strength were normal. Head turning and shoulder shrug  were normal and symmetric. Motor: The motor  testing reveals 5 over 5 strength of all 4 extremities. Good symmetric motor tone is noted throughout.  Sensory: Sensory testing is intact to soft touch on all 4 extremities. No evidence of extinction is noted.  Coordination: Cerebellar testing reveals good finger-nose-finger and heel-to-shin bilaterally.  Gait and station: Gait is normal. Reflexes: Deep tendon reflexes are symmetric and normal bilaterally.   DIAGNOSTIC DATA (LABS, IMAGING, TESTING) - I reviewed patient records, labs, notes, testing and imaging myself where available.  Lab Results  Component Value Date   WBC 9.7 06/20/2022   HGB 15.0 06/20/2022   HCT 45.5 06/20/2022   MCV 92 06/20/2022   PLT 403 06/20/2022      Component Value Date/Time   NA 139 06/20/2022 1459   K 3.4 (L) 06/20/2022 1459   CL 97 06/20/2022 1459   CO2 25 06/20/2022 1459   GLUCOSE 84 06/20/2022 1459   BUN 11 06/20/2022 1459   CREATININE 0.88 06/20/2022 1459   CALCIUM 9.6 06/20/2022 1459   PROT 6.7 06/20/2022 1459   ALBUMIN 4.3 06/20/2022 1459   AST 27 06/20/2022 1459   ALT 33 (H) 06/20/2022 1459   ALKPHOS 165 (H) 06/20/2022 1459   BILITOT 0.3 06/20/2022 1459   GFRNONAA 72 01/25/2020 1606   GFRAA 83 01/25/2020 1606   No results found for: "CHOL", "HDL", "LDLCALC", "LDLDIRECT", "TRIG", "CHOLHDL" No results found for: "HGBA1C" Lab Results  Component Value Date   VITAMINB12 1,130 01/13/2019   No results found for: "TSH"  Margie Ege, AGNP-C, DNP 01/02/2023, 3:59 PM [facility] [address] [place], [place] [address] [phone]

## 2023-01-07 ENCOUNTER — Ambulatory Visit: Payer: 59 | Admitting: Neurology

## 2023-01-07 ENCOUNTER — Encounter: Payer: Self-pay | Admitting: Neurology

## 2023-01-15 ENCOUNTER — Other Ambulatory Visit: Payer: Self-pay | Admitting: Neurology

## 2023-01-15 DIAGNOSIS — R300 Dysuria: Secondary | ICD-10-CM | POA: Diagnosis not present

## 2023-01-15 DIAGNOSIS — R051 Acute cough: Secondary | ICD-10-CM | POA: Diagnosis not present

## 2023-01-15 DIAGNOSIS — J019 Acute sinusitis, unspecified: Secondary | ICD-10-CM | POA: Diagnosis not present

## 2023-01-15 DIAGNOSIS — R0981 Nasal congestion: Secondary | ICD-10-CM | POA: Diagnosis not present

## 2023-01-15 DIAGNOSIS — J449 Chronic obstructive pulmonary disease, unspecified: Secondary | ICD-10-CM | POA: Diagnosis not present

## 2023-01-15 DIAGNOSIS — R350 Frequency of micturition: Secondary | ICD-10-CM | POA: Diagnosis not present

## 2023-01-15 DIAGNOSIS — R509 Fever, unspecified: Secondary | ICD-10-CM | POA: Diagnosis not present

## 2023-01-15 DIAGNOSIS — M549 Dorsalgia, unspecified: Secondary | ICD-10-CM | POA: Diagnosis not present

## 2023-01-15 MED ORDER — AMPHETAMINE-DEXTROAMPHETAMINE 10 MG PO TABS: 10.0000 mg | ORAL_TABLET | Freq: Every day | ORAL | 0 refills | Status: DC

## 2023-01-15 NOTE — Telephone Encounter (Signed)
Last seen on 06/20/22 Follow up scheduled on 02/11/23 Last filled on 12/17/22 Rx pending to be signed

## 2023-02-11 ENCOUNTER — Ambulatory Visit (INDEPENDENT_AMBULATORY_CARE_PROVIDER_SITE_OTHER): Payer: 59 | Admitting: Neurology

## 2023-02-11 ENCOUNTER — Encounter: Payer: Self-pay | Admitting: Neurology

## 2023-02-11 VITALS — Ht 63.0 in | Wt 204.2 lb

## 2023-02-11 DIAGNOSIS — F418 Other specified anxiety disorders: Secondary | ICD-10-CM

## 2023-02-11 DIAGNOSIS — R5382 Chronic fatigue, unspecified: Secondary | ICD-10-CM | POA: Diagnosis not present

## 2023-02-11 DIAGNOSIS — R269 Unspecified abnormalities of gait and mobility: Secondary | ICD-10-CM | POA: Diagnosis not present

## 2023-02-11 DIAGNOSIS — G35D Multiple sclerosis, unspecified: Secondary | ICD-10-CM

## 2023-02-11 DIAGNOSIS — G35 Multiple sclerosis: Secondary | ICD-10-CM | POA: Diagnosis not present

## 2023-02-11 NOTE — Progress Notes (Addendum)
Patient: Sue Sanchez Date of Birth: Jan 22, 1971  Reason for Visit: Follow up History from: Patient Primary Neurologist: Sater   ASSESSMENT AND PLAN 52 y.o. year old female   1.  Relapsing remitting multiple sclerosis 2.  Fatigue 3.  Gait abnormality 4.  Neck pain  -Continue Gilenya, check routine labs today -Order MRI of cervical spine given increased neck pain, paresthesia both arms. Depending on results, may add in gabapentin, consider EMG. -Was orthostatic today, ensure stays hydrated, recommend follow-up with PCP for management of blood pressure medications. A lot of stress, she is tired, keeps her 58 year old grandchild 5 days a week for 12 hours  -May consider increase of Adderall from 10 mg daily to 15 mg after sees PCP -Continue Lexapro 10 mg daily for the mood -Continue Adderall 10 mg daily for chronic fatigue -Continue Nurtec 75 mg as needed for acute migraine headache -Follow up in 6 months or sooner if needed with Dr. Epimenio Foot   Addendum 02/25/23 SS: Office note from Dr. Dione Booze, no macular edema was seen, continue Gilenya.  HISTORY OF PRESENT ILLNESS: Today 02/11/23 Remains on Gilenya. MRI of the brain March 2024 showed stable MS lesions, no new lesions seen.  Labs, alk phos 165, ALT 33, WBC 9.7, absolute lymphocyte 0.4. Remains on Adderall 10 mg daily. Has more fatigue, doesn't feel medication is as effective. She was orthostatic today with BP sitting 156/91, standing 138/91, describes episodes of shaking on the inside, feels heart beat pounding. Sometimes feels like she is floating, wakes up in the morning, doesn't feel well. Walking is slow, be careful not to trip, has not fallen. If she stands too fast, gets dizzy. Sometimes her feet go numb if she sits too long, fingertips are buzzing. Has neck pain, radiates down either side depending on how she slept. She keeps her grandchild who is 2 everyday from 6-6. Migraines are better, takes Nurtec PRN, takes about once a month  at onset of blinking light aura. Got a UTI, since then hard to hold bladder. More neck pain, trying ibuprofen, bilateral, numbness intermittently to arms. Takes Vitamin D daily.   06/20/22 SS: Remains on Gilenya. Feels MS is doing ok. Has occasional "quirks", sharp pains in different places, feet go numb, feels hot inside, doesn't last 24 hours. Stopped Provigil, switched to Adderall 10 mg daily, it helps. She takes care of 18 month grand baby during the day. She smokes, trying to cut back, smokes pack a day. Takes Vitamin D. Takes Flexeril as needed, none this month. Lexapro 10 mg daily. Nurtec as needed for migraines, few times a month, it helps very well for aura at onset, with ibuprofen.  Vision is fine, wears glasses. Numbness in fingers come and goes, especially if hold neck in same place to long, uses gel to neck, resolves, neck massager. Still on urinary leaking, doesn't want any pills. Uses pads. Gait is okay, no falls lately. PCP referred to podiatrist left 4th toe pain. Eating well, no more trouble swallowing.   HISTORY  12/13/21 Dr. Epimenio Foot MS history: She was diagnosed with MS in 2014 after presenting with left sided numbness.   She was placed on Copaxone but had issues with the injections.  She then switched to Tecfidera but had GI issues/gastritis.   She then swithced to Gilenya around since 2016.    She has not had any major exacerbation though symptoms fluctuate.   Currently, she feels gait is slow and she feels balance is off.  She  notes her fingertips are numb.   She notes numbness in her legs if she sits a long time.    She reports some issues with swallowing.  Speech is fine.    She has urinary urgency and occasional leakage.    Vision does well for the most part -- but blurriness occurs when she looks at a computer screen a long time.      She has fatigue and is on modafinil.     She had previously been on Adderall and felt MS fatigue as well as attention/focus she did better   She has  migraines headaches and gets them 2 times a week.   Ibuprofen helps if mild    Nurtec works better than Relpax if Ibu does not work.          IMAGING MRI brain 08/04/2021 , 08/14/2019 09/08/2014 show the same few T2/FLAIR hyperintense foci in the juxtacortical and periventricular white matter of the cerebral hemispheres in a pattern configuration consistent with chronic demyelinating plaque associated with multiple sclerosis.  Infratentorial white matter was normal.  None of the foci appear to be acute.  The overall plaque burden is light   MRI cervical spine 08/04/2020 showed no spinal plaques.  She has some degenerative changes with mild spinal stenosis at C3-C4.  REVIEW OF SYSTEMS: Out of a complete 14 system review of symptoms, the patient complains only of the following symptoms, and all other reviewed systems are negative.  See HPI  ALLERGIES: No Known Allergies  HOME MEDICATIONS: Outpatient Medications Prior to Visit  Medication Sig Dispense Refill   amphetamine-dextroamphetamine (ADDERALL) 10 MG tablet Take 1 tablet (10 mg total) by mouth daily with breakfast. 30 tablet 0   budesonide-formoterol (SYMBICORT) 160-4.5 MCG/ACT inhaler Inhale 2 puffs into the lungs 2 (two) times daily.     Cholecalciferol (VITAMIN D-3) 125 MCG (5000 UT) TABS Take 5,000 Units by mouth daily. 90 tablet 0   cyclobenzaprine (FLEXERIL) 5 MG tablet TAKE 2 TABLETS BY MOUTH AT  BEDTIME AS NEEDED FOR  MUSCLE SPASMS 60 tablet 5   escitalopram (LEXAPRO) 10 MG tablet Take 1 tablet (10 mg total) by mouth daily. 90 tablet 3   Esomeprazole Magnesium (NEXIUM PO) Take 1 tablet by mouth 2 (two) times daily.     Fingolimod HCl (GILENYA) 0.5 MG CAPS Take 1 capsule (0.5 mg total) by mouth daily. 90 capsule 3   hydrochlorothiazide (HYDRODIURIL) 25 MG tablet Take 25 mg by mouth daily.     ibuprofen (ADVIL,MOTRIN) 200 MG tablet Take 3 tablets (600 mg total) by mouth every 6 (six) hours as needed for headache or moderate pain. 30  tablet 0   PROVENTIL HFA 108 (90 BASE) MCG/ACT inhaler Inhale 2 puffs into the lungs every 6 (six) hours as needed for wheezing or shortness of breath.      Rimegepant Sulfate (NURTEC) 75 MG TBDP Take 1 tablet (75 mg total) by mouth as needed (Take 1 tablet at headache onset, max is 1 tablet in 24 hours). 16 tablet 11   rosuvastatin (CRESTOR) 10 MG tablet Take 10 mg by mouth at bedtime.     Cyanocobalamin (B-12 PO) Take 1 tablet by mouth daily.      No facility-administered medications prior to visit.    PAST MEDICAL HISTORY: Past Medical History:  Diagnosis Date   Abnormality of gait 03/03/2015   B12 deficiency 08/31/2014   Chronic fatigue 08/31/2014   Classic migraine 01/12/2016   COPD (chronic obstructive pulmonary disease) (HCC)  Gastroesophageal reflux disease    Migraine    MS (multiple sclerosis) (HCC)    Numbness    bilateral lower extremity   Obesity    Osteoarthritis    diagnosed by orthopedics   Vitamin D deficiency     PAST SURGICAL HISTORY: Past Surgical History:  Procedure Laterality Date   CESAREAN SECTION     COSMETIC SURGERY     lower right leg   FOOT SURGERY     Club feet   NASAL SINUS SURGERY     10/23/2016,,  dev septum, other   TUBAL LIGATION Bilateral     FAMILY HISTORY: Family History  Problem Relation Age of Onset   Diabetes Mother    Bipolar disorder Sister    Bipolar disorder Brother    Cancer Maternal Grandmother    Colon cancer Paternal Grandmother    COPD Paternal Grandfather    Heart disease Paternal Grandfather     SOCIAL HISTORY: Social History   Socioeconomic History   Marital status: Divorced    Spouse name: Not on file   Number of children: 2   Years of education: college   Highest education level: Not on file  Occupational History   Occupation: Unemployed  Tobacco Use   Smoking status: Every Day    Current packs/day: 0.50    Average packs/day: 0.5 packs/day for 30.0 years (15.0 ttl pk-yrs)    Types: Cigarettes    Smokeless tobacco: Never  Substance and Sexual Activity   Alcohol use: No    Alcohol/week: 0.0 standard drinks of alcohol   Drug use: No   Sexual activity: Yes  Other Topics Concern   Not on file  Social History Narrative   Patient is Divorced with 2 children.   Patient has a college education.   Patient is right handed.   Caffeine Intake: 6 or more sodas daily      Patient has a dog named Butters.   Social Determinants of Health   Financial Resource Strain: Not on file  Food Insecurity: Not on file  Transportation Needs: Not on file  Physical Activity: Not on file  Stress: Not on file  Social Connections: Not on file  Intimate Partner Violence: Not on file    PHYSICAL EXAM  Vitals:   02/11/23 1446  Weight: 204 lb 3.2 oz (92.6 kg)  Height: 5\' 3"  (1.6 m)    Body mass index is 36.17 kg/m.  Generalized: Well developed, in no acute distress  Neurological examination  Mentation: Alert oriented to time, place, history taking. Follows all commands speech and language fluent Cranial nerve II-XII: Pupils were equal round reactive to light. Extraocular movements were full, visual field were full on confrontational test. Facial sensation and strength were normal. Head turning and shoulder shrug  were normal and symmetric. Motor: The motor testing reveals 5 over 5 strength of all 4 extremities. Good symmetric motor tone is noted throughout.  Sensory: Sensory testing is intact to soft touch on all 4 extremities. No evidence of extinction is noted.  Coordination: Cerebellar testing reveals good finger-nose-finger and heel-to-shin bilaterally.  Gait and station: Gait is normal. Reflexes: Deep tendon reflexes are symmetric and normal bilaterally.   DIAGNOSTIC DATA (LABS, IMAGING, TESTING) - I reviewed patient records, labs, notes, testing and imaging myself where available.  Lab Results  Component Value Date   WBC 9.7 06/20/2022   HGB 15.0 06/20/2022   HCT 45.5 06/20/2022    MCV 92 06/20/2022   PLT 403 06/20/2022  Component Value Date/Time   NA 139 06/20/2022 1459   K 3.4 (L) 06/20/2022 1459   CL 97 06/20/2022 1459   CO2 25 06/20/2022 1459   GLUCOSE 84 06/20/2022 1459   BUN 11 06/20/2022 1459   CREATININE 0.88 06/20/2022 1459   CALCIUM 9.6 06/20/2022 1459   PROT 6.7 06/20/2022 1459   ALBUMIN 4.3 06/20/2022 1459   AST 27 06/20/2022 1459   ALT 33 (H) 06/20/2022 1459   ALKPHOS 165 (H) 06/20/2022 1459   BILITOT 0.3 06/20/2022 1459   GFRNONAA 72 01/25/2020 1606   GFRAA 83 01/25/2020 1606   No results found for: "CHOL", "HDL", "LDLCALC", "LDLDIRECT", "TRIG", "CHOLHDL" No results found for: "HGBA1C" Lab Results  Component Value Date   VITAMINB12 1,130 01/13/2019   No results found for: "TSH"  Margie Ege, AGNP-C, DNP 02/11/2023, 4:01 PM [facility] [address], [address] [place], [place] [address] [phone]

## 2023-02-11 NOTE — Patient Instructions (Signed)
Continue current medications.  We will check labs today.  Please follow-up with your primary care doctor for management of blood pressure.  Stay well-hydrated.  Try to get good rest.  We will check MRI of your cervical spine for evaluation of neck pain.

## 2023-02-12 ENCOUNTER — Telehealth: Payer: Self-pay | Admitting: Neurology

## 2023-02-12 LAB — CBC WITH DIFFERENTIAL/PLATELET
Basophils Absolute: 0 10*3/uL (ref 0.0–0.2)
Basos: 0 %
EOS (ABSOLUTE): 0.3 10*3/uL (ref 0.0–0.4)
Eos: 5 %
Hematocrit: 45.8 % (ref 34.0–46.6)
Hemoglobin: 15.3 g/dL (ref 11.1–15.9)
Immature Grans (Abs): 0 10*3/uL (ref 0.0–0.1)
Immature Granulocytes: 0 %
Lymphocytes Absolute: 0.3 10*3/uL — ABNORMAL LOW (ref 0.7–3.1)
Lymphs: 5 %
MCH: 30.5 pg (ref 26.6–33.0)
MCHC: 33.4 g/dL (ref 31.5–35.7)
MCV: 91 fL (ref 79–97)
Monocytes Absolute: 0.9 10*3/uL (ref 0.1–0.9)
Monocytes: 14 %
Neutrophils Absolute: 5.3 10*3/uL (ref 1.4–7.0)
Neutrophils: 76 %
Platelets: 357 10*3/uL (ref 150–450)
RBC: 5.01 x10E6/uL (ref 3.77–5.28)
RDW: 13 % (ref 11.7–15.4)
WBC: 6.9 10*3/uL (ref 3.4–10.8)

## 2023-02-12 LAB — COMPREHENSIVE METABOLIC PANEL
ALT: 24 [IU]/L (ref 0–32)
AST: 21 [IU]/L (ref 0–40)
Albumin: 4.2 g/dL (ref 3.8–4.9)
Alkaline Phosphatase: 145 [IU]/L — ABNORMAL HIGH (ref 44–121)
BUN/Creatinine Ratio: 12 (ref 9–23)
BUN: 12 mg/dL (ref 6–24)
Bilirubin Total: 0.4 mg/dL (ref 0.0–1.2)
CO2: 25 mmol/L (ref 20–29)
Calcium: 10 mg/dL (ref 8.7–10.2)
Chloride: 97 mmol/L (ref 96–106)
Creatinine, Ser: 0.98 mg/dL (ref 0.57–1.00)
Globulin, Total: 2.6 g/dL (ref 1.5–4.5)
Glucose: 127 mg/dL — ABNORMAL HIGH (ref 70–99)
Potassium: 3.4 mmol/L — ABNORMAL LOW (ref 3.5–5.2)
Sodium: 140 mmol/L (ref 134–144)
Total Protein: 6.8 g/dL (ref 6.0–8.5)
eGFR: 69 mL/min/{1.73_m2} (ref >59)

## 2023-02-12 NOTE — Telephone Encounter (Signed)
[employer] NPR sent to GI [phone] 

## 2023-02-17 ENCOUNTER — Ambulatory Visit
Admission: RE | Admit: 2023-02-17 | Discharge: 2023-02-17 | Disposition: A | Discharge status: Home / Self Care | Payer: 59 | Source: Ambulatory Visit | Attending: Neurology | Admitting: Neurology

## 2023-02-17 ENCOUNTER — Other Ambulatory Visit: Payer: Self-pay | Admitting: Neurology

## 2023-02-17 DIAGNOSIS — R269 Unspecified abnormalities of gait and mobility: Secondary | ICD-10-CM

## 2023-02-17 MED ORDER — AMPHETAMINE-DEXTROAMPHETAMINE 10 MG PO TABS: 10.0000 mg | ORAL_TABLET | Freq: Every day | ORAL | 0 refills | Status: DC

## 2023-02-17 NOTE — Telephone Encounter (Signed)
Last seen on 02/11/23 Follow up scheduled on 09/01/23 Last filled on 01/15/23 #30 tablets (30 day supply) Rx pending to be signed

## 2023-02-18 DIAGNOSIS — H2513 Age-related nuclear cataract, bilateral: Secondary | ICD-10-CM | POA: Diagnosis not present

## 2023-02-18 DIAGNOSIS — M349 Systemic sclerosis, unspecified: Secondary | ICD-10-CM | POA: Diagnosis not present

## 2023-02-18 DIAGNOSIS — H04123 Dry eye syndrome of bilateral lacrimal glands: Secondary | ICD-10-CM | POA: Diagnosis not present

## 2023-02-18 DIAGNOSIS — Z79899 Other long term (current) drug therapy: Secondary | ICD-10-CM | POA: Diagnosis not present

## 2023-02-19 ENCOUNTER — Telehealth: Payer: Self-pay | Admitting: Neurology

## 2023-02-19 DIAGNOSIS — G35 Multiple sclerosis: Secondary | ICD-10-CM

## 2023-02-19 DIAGNOSIS — R269 Unspecified abnormalities of gait and mobility: Secondary | ICD-10-CM

## 2023-02-19 NOTE — Telephone Encounter (Signed)
I called the patient. We reviewed MRI cervical spine results, C3-C4 disc protrusion distorting the left thecal sac causing mild to moderate spinal stenosis.  At C5-C6 potential for right C6 nerve root compression.  C6/C7 mild to moderate spinal stenosis.  Given MRI findings as well as symptoms, will refer to [facility] for evaluation as well as pain management options.  We discussed a trial of physical therapy, reports her insurance only pay for 1 visit, and has done this before.  I offered gabapentin, wants to hold off for now.    IMPRESSION: This MRI of the cervical spine without contrast shows the following: The spinal cord has normal signal. At C3-C4, there is a left paramedian disc protrusion distorting the left thecal sac and causing mild to moderate spinal stenosis.  There is no nerve root compression. At C4-C5, there are degenerative changes causing mild spinal stenosis and right greater than left foraminal narrowing but no nerve root compression. At C5-C6, there are degenerative changes causing mild spinal stenosis and moderately severe right and moderate left foraminal narrowing.  There is potential for right C6 nerve root compression. At C6-C7, there are degenerative changes causing mild to moderate spinal stenosis but no nerve root compression.  Orders Placed This Encounter  Procedures   Ambulatory referral to Neurosurgery

## 2023-02-24 ENCOUNTER — Telehealth: Payer: Self-pay | Admitting: Neurology

## 2023-02-24 NOTE — Telephone Encounter (Signed)
Referral for neurosurgery fax to [facility].  Phone: [phone], Fax: [phone] 

## 2023-03-04 ENCOUNTER — Encounter: Payer: Self-pay | Admitting: Neurology

## 2023-03-18 ENCOUNTER — Other Ambulatory Visit: Payer: Self-pay | Admitting: Neurology

## 2023-03-18 DIAGNOSIS — M5010 Cervical disc disorder with radiculopathy, unspecified cervical region: Secondary | ICD-10-CM | POA: Diagnosis not present

## 2023-03-18 DIAGNOSIS — M542 Cervicalgia: Secondary | ICD-10-CM | POA: Diagnosis not present

## 2023-03-18 MED ORDER — AMPHETAMINE-DEXTROAMPHETAMINE 10 MG PO TABS: 10.0000 mg | ORAL_TABLET | Freq: Every day | ORAL | 0 refills | Status: DC

## 2023-03-18 NOTE — Telephone Encounter (Signed)
Last seen on 02/11/23 Follow up scheduled on 09/01/23 Last filled on 02/17/23 #30 tablets (30 day supply) Rx pending to be signed

## 2023-04-07 DIAGNOSIS — R059 Cough, unspecified: Secondary | ICD-10-CM | POA: Diagnosis not present

## 2023-04-07 DIAGNOSIS — R070 Pain in throat: Secondary | ICD-10-CM | POA: Diagnosis not present

## 2023-04-18 ENCOUNTER — Other Ambulatory Visit: Payer: Self-pay | Admitting: Neurology

## 2023-04-21 MED ORDER — AMPHETAMINE-DEXTROAMPHETAMINE 10 MG PO TABS: 10.0000 mg | ORAL_TABLET | Freq: Every day | ORAL | 0 refills | Status: DC

## 2023-04-21 NOTE — Telephone Encounter (Signed)
 Last seen on 02/11/23 Follow up scheduled on 09/01/23 Last filled on 03/18/23 #30 tablets (30 day supply) Rx pending to be signed

## 2023-04-24 ENCOUNTER — Other Ambulatory Visit: Payer: Self-pay | Admitting: Neurology

## 2023-05-19 ENCOUNTER — Other Ambulatory Visit: Payer: Self-pay | Admitting: Neurology

## 2023-05-20 DIAGNOSIS — R062 Wheezing: Secondary | ICD-10-CM | POA: Diagnosis not present

## 2023-05-20 DIAGNOSIS — J449 Chronic obstructive pulmonary disease, unspecified: Secondary | ICD-10-CM | POA: Diagnosis not present

## 2023-05-20 DIAGNOSIS — R059 Cough, unspecified: Secondary | ICD-10-CM | POA: Diagnosis not present

## 2023-05-20 DIAGNOSIS — L03011 Cellulitis of right finger: Secondary | ICD-10-CM | POA: Diagnosis not present

## 2023-05-21 MED ORDER — AMPHETAMINE-DEXTROAMPHETAMINE 10 MG PO TABS: 10.0000 mg | ORAL_TABLET | Freq: Every day | ORAL | 0 refills | Status: DC

## 2023-06-19 ENCOUNTER — Other Ambulatory Visit: Payer: Self-pay | Admitting: Neurology

## 2023-06-23 ENCOUNTER — Other Ambulatory Visit: Payer: Self-pay | Admitting: Neurology

## 2023-06-24 MED ORDER — AMPHETAMINE-DEXTROAMPHETAMINE 10 MG PO TABS: 10.0000 mg | ORAL_TABLET | Freq: Every day | ORAL | 0 refills | Status: DC

## 2023-06-24 NOTE — Telephone Encounter (Signed)
 Last seen 02/11/23, next appt 09/01/23  Dispenses   Dispensed Days Supply Quantity Provider Pharmacy  DEXTROAMP-AMPHETAMIN 10 MG TAB 05/21/2023 30 30 each Sater, Pearletha Furl, MD [facility] [phone] - R...  DEXTROAMP-AMPHETAMIN 10 MG TAB 04/21/2023 30 30 each Sater, Pearletha Furl, MD [facility] [phone] - R...  DEXTROAMP-AMPHETAMIN 10 MG TAB 03/18/2023 30 30 each Sater, Pearletha Furl, MD [facility] [phone] - R...  DEXTROAMP-AMPHETAMIN 10 MG TAB 02/17/2023 30 30 each Sater, Pearletha Furl, MD [facility] [phone] - R...  DEXTROAMP-AMPHETAMIN 10 MG TAB 01/15/2023 30 30 each Sater, Pearletha Furl, MD [facility] [phone] - R...  DEXTROAMP-AMPHETAMIN 10 MG TAB 12/17/2022 30 30 each Sater, Pearletha Furl, MD [facility] [phone] - R...  DEXTROAMP-AMPHETAMIN 10 MG TAB 11/13/2022 30 30 each Sater, Pearletha Furl, MD [facility] [phone] - R...  DEXTROAMP-AMPHETAMIN 10 MG TAB 10/15/2022 30 30 each Sater, Pearletha Furl, MD [facility] [phone] - R...  DEXTROAMP-AMPHETAMIN 10 MG TAB 09/16/2022 30 30 each Sater, Pearletha Furl, MD [facility] [phone] - R...  DEXTROAMP-AMPHETAMIN 10 MG TAB 08/19/2022 30 30 each Sater, Pearletha Furl, MD [facility] [phone] - R...  DEXTROAMP-AMPHETAMIN 10 MG TAB 07/15/2022 30 30 each Sater, Pearletha Furl, MD [facility] [phone] - R.Marland KitchenMarland Kitchen

## 2023-07-07 ENCOUNTER — Other Ambulatory Visit: Payer: Self-pay | Admitting: Family Medicine

## 2023-07-07 DIAGNOSIS — Z79899 Other long term (current) drug therapy: Secondary | ICD-10-CM | POA: Diagnosis not present

## 2023-07-07 DIAGNOSIS — E559 Vitamin D deficiency, unspecified: Secondary | ICD-10-CM | POA: Diagnosis not present

## 2023-07-07 DIAGNOSIS — K219 Gastro-esophageal reflux disease without esophagitis: Secondary | ICD-10-CM | POA: Diagnosis not present

## 2023-07-07 DIAGNOSIS — Z87891 Personal history of nicotine dependence: Secondary | ICD-10-CM | POA: Diagnosis not present

## 2023-07-07 DIAGNOSIS — G35 Multiple sclerosis: Secondary | ICD-10-CM | POA: Diagnosis not present

## 2023-07-07 DIAGNOSIS — E7800 Pure hypercholesterolemia, unspecified: Secondary | ICD-10-CM | POA: Diagnosis not present

## 2023-07-07 DIAGNOSIS — J439 Emphysema, unspecified: Secondary | ICD-10-CM | POA: Diagnosis not present

## 2023-07-07 DIAGNOSIS — I10 Essential (primary) hypertension: Secondary | ICD-10-CM | POA: Diagnosis not present

## 2023-07-07 DIAGNOSIS — Z1231 Encounter for screening mammogram for malignant neoplasm of breast: Secondary | ICD-10-CM

## 2023-07-10 DIAGNOSIS — R748 Abnormal levels of other serum enzymes: Secondary | ICD-10-CM | POA: Diagnosis not present

## 2023-07-10 DIAGNOSIS — R1013 Epigastric pain: Secondary | ICD-10-CM | POA: Diagnosis not present

## 2023-07-10 DIAGNOSIS — Z20822 Contact with and (suspected) exposure to covid-19: Secondary | ICD-10-CM | POA: Diagnosis not present

## 2023-07-10 DIAGNOSIS — R079 Chest pain, unspecified: Secondary | ICD-10-CM | POA: Diagnosis not present

## 2023-07-10 DIAGNOSIS — R110 Nausea: Secondary | ICD-10-CM | POA: Diagnosis not present

## 2023-07-10 DIAGNOSIS — R0789 Other chest pain: Secondary | ICD-10-CM | POA: Diagnosis not present

## 2023-07-10 DIAGNOSIS — I4510 Unspecified right bundle-branch block: Secondary | ICD-10-CM | POA: Diagnosis not present

## 2023-07-10 DIAGNOSIS — Z8719 Personal history of other diseases of the digestive system: Secondary | ICD-10-CM | POA: Diagnosis not present

## 2023-07-10 DIAGNOSIS — R109 Unspecified abdominal pain: Secondary | ICD-10-CM | POA: Diagnosis not present

## 2023-07-23 ENCOUNTER — Other Ambulatory Visit: Payer: Self-pay | Admitting: Neurology

## 2023-07-24 MED ORDER — AMPHETAMINE-DEXTROAMPHETAMINE 10 MG PO TABS: 10.0000 mg | ORAL_TABLET | Freq: Every day | ORAL | 0 refills | Status: DC

## 2023-07-24 NOTE — Telephone Encounter (Signed)
 Last seen on 02/11/23 Follow up scheduled on 09/01/22   Dispensed Days Supply Quantity Provider Pharmacy  DEXTROAMP-AMPHETAMIN 10 MG TAB 06/27/2023 30 30 each Asa Lente, MD [facility] [phone] - R...      Rx pending to be signed

## 2023-08-04 ENCOUNTER — Ambulatory Visit
Admission: RE | Admit: 2023-08-04 | Discharge: 2023-08-04 | Disposition: A | Discharge status: Home / Self Care | Source: Ambulatory Visit | Attending: Family Medicine | Admitting: Family Medicine

## 2023-08-04 DIAGNOSIS — Z87891 Personal history of nicotine dependence: Secondary | ICD-10-CM

## 2023-08-04 DIAGNOSIS — Z122 Encounter for screening for malignant neoplasm of respiratory organs: Secondary | ICD-10-CM | POA: Diagnosis not present

## 2023-08-06 ENCOUNTER — Ambulatory Visit
Admission: RE | Admit: 2023-08-06 | Discharge: 2023-08-06 | Discharge status: Home / Self Care | Source: Ambulatory Visit | Attending: Family Medicine | Admitting: Family Medicine

## 2023-08-06 DIAGNOSIS — Z1231 Encounter for screening mammogram for malignant neoplasm of breast: Secondary | ICD-10-CM

## 2023-08-11 ENCOUNTER — Other Ambulatory Visit: Payer: Self-pay | Admitting: Family Medicine

## 2023-08-11 DIAGNOSIS — R928 Other abnormal and inconclusive findings on diagnostic imaging of breast: Secondary | ICD-10-CM

## 2023-08-22 ENCOUNTER — Ambulatory Visit
Admission: RE | Admit: 2023-08-22 | Discharge: 2023-08-22 | Disposition: A | Discharge status: Home / Self Care | Source: Ambulatory Visit | Attending: Family Medicine | Admitting: Family Medicine

## 2023-08-22 DIAGNOSIS — R921 Mammographic calcification found on diagnostic imaging of breast: Secondary | ICD-10-CM | POA: Diagnosis not present

## 2023-08-22 DIAGNOSIS — R928 Other abnormal and inconclusive findings on diagnostic imaging of breast: Secondary | ICD-10-CM

## 2023-08-25 ENCOUNTER — Other Ambulatory Visit: Payer: Self-pay | Admitting: Family Medicine

## 2023-08-25 ENCOUNTER — Other Ambulatory Visit: Payer: Self-pay | Admitting: Neurology

## 2023-08-25 ENCOUNTER — Encounter: Payer: Self-pay | Admitting: Family Medicine

## 2023-08-25 DIAGNOSIS — R921 Mammographic calcification found on diagnostic imaging of breast: Secondary | ICD-10-CM

## 2023-08-25 MED ORDER — AMPHETAMINE-DEXTROAMPHETAMINE 10 MG PO TABS: 10.0000 mg | ORAL_TABLET | Freq: Every day | ORAL | 0 refills | Status: DC

## 2023-08-25 NOTE — Telephone Encounter (Signed)
 Last seen on 1029/24 Follow up scheduled on 09/01/23  DEXTROAMP-AMPHETAMIN 10 MG TAB 07/28/2023 30 30 each Sater, Sherida Dimmer, MD [facility] [phone] - R...    Rx pending to be signed

## 2023-09-01 ENCOUNTER — Encounter: Payer: Self-pay | Admitting: Neurology

## 2023-09-01 ENCOUNTER — Telehealth (INDEPENDENT_AMBULATORY_CARE_PROVIDER_SITE_OTHER): Payer: 59 | Admitting: Neurology

## 2023-09-01 ENCOUNTER — Telehealth: Payer: Self-pay | Admitting: Neurology

## 2023-09-01 DIAGNOSIS — G35 Multiple sclerosis: Secondary | ICD-10-CM

## 2023-09-01 DIAGNOSIS — R269 Unspecified abnormalities of gait and mobility: Secondary | ICD-10-CM | POA: Diagnosis not present

## 2023-09-01 DIAGNOSIS — G43109 Migraine with aura, not intractable, without status migrainosus: Secondary | ICD-10-CM | POA: Diagnosis not present

## 2023-09-01 DIAGNOSIS — R5382 Chronic fatigue, unspecified: Secondary | ICD-10-CM

## 2023-09-01 DIAGNOSIS — Z79899 Other long term (current) drug therapy: Secondary | ICD-10-CM | POA: Diagnosis not present

## 2023-09-01 NOTE — Progress Notes (Signed)
 [facility]  PATIENT: Sue Sanchez DOB: July 13, 1970  REFERRING DOCTOR OR PCP:  Enos Harts, MD SOURCE: Patient, notes from Dr. Tilda Fogo, imaging and lab reports, MRI images personally reviewed.  _________________________________   Virtual Visit via Video Note I connected with Sue Sanchez  on 09/01/23 at 11:30 AM EDT by a video enabled telemedicine application and verified that I am speaking with the correct person.  I discussed the limitations of evaluation and management by telemedicine and the availability of in person appointments. The patient expressed understanding and agreed to proceed.  Patient at home; provider in office    HISTORICAL  CHIEF COMPLAINT:  Chief Complaint  Patient presents with   Multiple Sclerosis    HISTORY OF PRESENT ILLNESS:  Sue Sanchez is a 53 y.o. woman with multiple sclerosis.  She is on. GIlenya .    CBC/D shows lymphocytes are 0.4   MRI 06/29/2022 showed no new lesions  Currently, she notes gait is slow and she feels balance is off.  She can walk ok on a flat surface and could go 300 feet without a rest, she thinks.    Stairs are difficult - she needs to scoot down and bannister needed going up.    She notes numbness in digits 4/5 on the left.    Swallowing is better.  Speech is fine.      She has urinary urgency and occasional leakage.      Vision does well for the most part -- but blurriness occurs when she looks at a computer screen a long time.     She has fatigue.  Adderall helps more than modafinil ;.   Sleep is ok most nights  Migraines headaches are rare now since stopping Lexapro    Nurtec works better than Relpax if Ibu does not work.     She is a well-developed well-nourished woman in no acute distress.  The head is normocephalic and atraumatic.  Sclera are anicteric.  Visible skin appears normal.  The neck has a good range of motion.   She is alert and fully oriented with fluent speech and good  attention, knowledge and memory.  Extraocular muscles are intact.  Facial strength is normal.  She appears to have normal strength in the arms.  Rapid alternating movements and finger-nose-finger are performed well.    MS history: She was diagnosed with MS in 2014 after presenting with left sided numbness.   She was placed on Copaxone  but had issues with the injections.  She then switched to Tecfidera but had GI issues/gastritis.   She then swithced to Gilenya  around since 2016.    She has not had any major exacerbation though symptoms fluctuate.     IMAGING MRI brain 06/29/2022, 08/04/2021 , 08/14/2019 09/08/2014 show the same few T2/FLAIR hyperintense foci in the juxtacortical and periventricular white matter of the cerebral hemispheres in a pattern configuration consistent with chronic demyelinating plaque associated with multiple sclerosis.  Infratentorial white matter was normal.  None of the foci appear to be acute.  The overall plaque burden is light  MRI cervical spine 08/04/2020 showed no spinal plaques.   Has DJD At C3-C4, there is a left paramedian disc protrusion distorting the left thecal sac and causing mild to moderate spinal stenosis.  There is no nerve root compression. At C4-C5, there are degenerative changes causing mild spinal stenosis and right greater than left foraminal narrowing but no nerve root compression. At C5-C6, there are degenerative changes causing mild spinal stenosis and moderately  severe right and moderate left foraminal narrowing.  There is potential for right C6 nerve root compression. At C6-C7, there are degenerative changes causing mild to moderate spinal stenosis but no nerve root compression.   REVIEW OF SYSTEMS: Constitutional: No fevers, chills, sweats, or change in appetite.  She notes fatigue. Eyes: No visual changes, double vision, eye pain Ear, nose and throat: No hearing loss, ear pain, nasal congestion, sore throat Cardiovascular: No chest pain,  palpitations Respiratory:  No shortness of breath at rest or with exertion.   No wheezes GastrointestinaI: No nausea, vomiting, diarrhea, abdominal pain, fecal incontinence Genitourinary:  No dysuria, urinary retention or frequency.  No nocturia. Musculoskeletal:  No neck pain, back pain Integumentary: No rash, pruritus, skin lesions Neurological: as above Psychiatric: No depression at this time.  No anxiety Endocrine: No palpitations, diaphoresis, change in appetite, change in weigh or increased thirst Hematologic/Lymphatic:  No anemia, purpura, petechiae. Allergic/Immunologic: No itchy/runny eyes, nasal congestion, recent allergic reactions, rashes  ALLERGIES: No Known Allergies  HOME MEDICATIONS:  Current Outpatient Medications:    amphetamine -dextroamphetamine  (ADDERALL) 10 MG tablet, Take 1 tablet (10 mg total) by mouth daily with breakfast., Disp: 30 tablet, Rfl: 0   amphetamine -dextroamphetamine  (ADDERALL) 10 MG tablet, Take 1 tablet (10 mg total) by mouth daily with breakfast., Disp: 30 tablet, Rfl: 0   budesonide-formoterol (SYMBICORT) 160-4.5 MCG/ACT inhaler, Inhale 2 puffs into the lungs 2 (two) times daily., Disp: , Rfl:    Cholecalciferol  (VITAMIN D -3) 125 MCG (5000 UT) TABS, Take 5,000 Units by mouth daily., Disp: 90 tablet, Rfl: 0   cyclobenzaprine  (FLEXERIL ) 5 MG tablet, TAKE 2 TABLETS BY MOUTH AT  BEDTIME AS NEEDED FOR  MUSCLE SPASMS, Disp: 60 tablet, Rfl: 5   escitalopram  (LEXAPRO ) 10 MG tablet, TAKE 1 TABLET BY MOUTH DAILY, Disp: 90 tablet, Rfl: 3   Esomeprazole Magnesium (NEXIUM PO), Take 1 tablet by mouth 2 (two) times daily., Disp: , Rfl:    Fingolimod  HCl (GILENYA ) 0.5 MG CAPS, Take 1 capsule (0.5 mg total) by mouth daily., Disp: 90 capsule, Rfl: 3   hydrochlorothiazide (HYDRODIURIL) 25 MG tablet, Take 25 mg by mouth daily., Disp: , Rfl:    ibuprofen  (ADVIL ,MOTRIN ) 200 MG tablet, Take 3 tablets (600 mg total) by mouth every 6 (six) hours as needed for headache or  moderate pain., Disp: 30 tablet, Rfl: 0   PROVENTIL HFA 108 (90 BASE) MCG/ACT inhaler, Inhale 2 puffs into the lungs every 6 (six) hours as needed for wheezing or shortness of breath. , Disp: , Rfl:    Rimegepant Sulfate (NURTEC) 75 MG TBDP, Take 1 tablet (75 mg total) by mouth as needed (Take 1 tablet at headache onset, max is 1 tablet in 24 hours)., Disp: 16 tablet, Rfl: 11   rosuvastatin (CRESTOR) 10 MG tablet, Take 10 mg by mouth at bedtime., Disp: , Rfl:   PAST MEDICAL HISTORY: Past Medical History:  Diagnosis Date   Abnormality of gait 03/03/2015   B12 deficiency 08/31/2014   Chronic fatigue 08/31/2014   Classic migraine 01/12/2016   COPD (chronic obstructive pulmonary disease) (HCC)    Gastroesophageal reflux disease    Migraine    MS (multiple sclerosis) (HCC)    Numbness    bilateral lower extremity   Obesity    Osteoarthritis    diagnosed by orthopedics   Vitamin D  deficiency     PAST SURGICAL HISTORY: Past Surgical History:  Procedure Laterality Date   CESAREAN SECTION     COSMETIC SURGERY  lower right leg   FOOT SURGERY     Club feet   NASAL SINUS SURGERY     10/23/2016,,  dev septum, other   TUBAL LIGATION Bilateral     FAMILY HISTORY: Family History  Problem Relation Age of Onset   Diabetes Mother    Bipolar disorder Sister    Bipolar disorder Brother    Cancer Maternal Grandmother    Colon cancer Paternal Grandmother    COPD Paternal Grandfather    Heart disease Paternal Grandfather     SOCIAL HISTORY:  Social History   Socioeconomic History   Marital status: Divorced    Spouse name: Not on file   Number of children: 2   Years of education: college   Highest education level: Not on file  Occupational History   Occupation: Unemployed  Tobacco Use   Smoking status: Every Day    Current packs/day: 0.50    Average packs/day: 0.5 packs/day for 30.0 years (15.0 ttl pk-yrs)    Types: Cigarettes   Smokeless tobacco: Never  Substance and Sexual  Activity   Alcohol use: No    Alcohol/week: 0.0 standard drinks of alcohol   Drug use: No   Sexual activity: Yes  Other Topics Concern   Not on file  Social History Narrative   Patient is Divorced with 2 children.   Patient has a college education.   Patient is right handed.   Caffeine Intake: 6 or more sodas daily      Patient has a dog named Butters.   Social Drivers of Corporate investment banker Strain: Not on file  Food Insecurity: Not on file  Transportation Needs: Not on file  Physical Activity: Not on file  Stress: Not on file  Social Connections: Not on file  Intimate Partner Violence: Not on file     PHYSICAL EXAM from prior visit  There were no vitals filed for this visit.   There is no height or weight on file to calculate BMI.   General: The patient is well-developed and well-nourished and in no acute distress  HEENT:  Head is /AT.  Sclera are anicteric.    Neck: No carotid bruits are noted.  The neck is nontender.  Cardiovascular: The heart has a regular rate and rhythm with a normal S1 and S2. There were no murmurs, gallops or rubs.    Skin: Extremities are without rash or  edema.  Musculoskeletal:  Back is nontender  Neurologic Exam  Mental status: The patient is alert and oriented x 3 at the time of the examination. The patient has apparent normal recent and remote memory, with an apparently normal attention span and concentration ability.   Speech is normal.  Cranial nerves: Extraocular movements are full. Pupils are equal, round, and reactive to light and accomodation.  Visual fields are full.  Facial symmetry is present. There is good facial sensation to soft touch bilaterally.Facial strength is normal.  Trapezius and sternocleidomastoid strength is normal. No dysarthria is noted.  The tongue is midline, and the patient has symmetric elevation of the soft palate. No obvious hearing deficits are noted.  Motor:  Muscle bulk is normal.   Tone is  normal. Strength is  5 / 5 in all 4 extremities.   Sensory: Sensory testing is intact to pinprick, soft touch and vibration sensation in all 4 extremities.  Coordination: Cerebellar testing reveals good finger-nose-finger and heel-to-shin bilaterally.  Gait and station: Station is normal.   Gait is mildly wide.  Tandem gait is wide. Romberg is negative.   Reflexes: Deep tendon reflexes are symmetric and normal bilaterally.   Plantar responses are flexor.    DIAGNOSTIC DATA (LABS, IMAGING, TESTING) - I reviewed patient records, labs, notes, testing and imaging myself where available.  Lab Results  Component Value Date   WBC 6.9 02/11/2023   HGB 15.3 02/11/2023   HCT 45.8 02/11/2023   MCV 91 02/11/2023   PLT 357 02/11/2023      Component Value Date/Time   NA 140 02/11/2023 1536   K 3.4 (L) 02/11/2023 1536   CL 97 02/11/2023 1536   CO2 25 02/11/2023 1536   GLUCOSE 127 (H) 02/11/2023 1536   BUN 12 02/11/2023 1536   CREATININE 0.98 02/11/2023 1536   CALCIUM 10.0 02/11/2023 1536   PROT 6.8 02/11/2023 1536   ALBUMIN 4.2 02/11/2023 1536   AST 21 02/11/2023 1536   ALT 24 02/11/2023 1536   ALKPHOS 145 (H) 02/11/2023 1536   BILITOT 0.4 02/11/2023 1536   GFRNONAA 72 01/25/2020 1606   GFRAA 83 01/25/2020 1606    Lab Results  Component Value Date   VITAMINB12 1,130 01/13/2019   No results found for: "TSH"     ASSESSMENT AND PLAN  MS (multiple sclerosis) (HCC)  Abnormality of gait  Chronic fatigue  Migraine with aura and without status migrainosus, not intractable  High risk medication use   Continue Gilenya .  recent CBC with differential showed lymphocytes = 0.4.  Consider MRI recheck around time of next visit.  We discussed considering a switch to [facility] when she is in her late 19s to allow her to stop daily medications Continue Adderall . Stay active and exercise as tolerated. Return in 6 months or sooner if there are new or worsening neurologic  symptoms.   Follow Up Instructions: I discussed the assessment and treatment plan with the patient. The patient was provided an opportunity to ask questions and all were answered. The patient agreed with the plan and demonstrated an understanding of the instructions.    The patient was advised to call back or seek an in-person evaluation if the symptoms worsen or if the condition fails to improve as anticipated.  I provided 25 minutes of non-face-to-face time during this encounter.  Priscilla Finklea A. Godwin Lat, MD, [facility] 09/01/2023, 11:52 AM Certified in Neurology, Clinical Neurophysiology, Sleep Medicine and Neuroimaging  [facility] [address], [address] [place], [place] [address] [phone]

## 2023-09-01 NOTE — Telephone Encounter (Signed)
 ..  Pt understands that although there may be some limitations with this type of visit, we will take all precautions to reduce any security or privacy concerns.  Pt understands that this will be treated like an in office visit and we will file with pt's insurance, and there may be a patient responsible charge related to this service. ? ?

## 2023-09-25 ENCOUNTER — Other Ambulatory Visit: Payer: Self-pay | Admitting: Neurology

## 2023-09-25 MED ORDER — AMPHETAMINE-DEXTROAMPHETAMINE 10 MG PO TABS: 10.0000 mg | ORAL_TABLET | Freq: Every day | ORAL | 0 refills | Status: DC

## 2023-09-25 NOTE — Telephone Encounter (Signed)
 Pt is requesting a refill for amphetamine -dextroamphetamine  (ADDERALL) 10 MG tablet.  [phone] [place], [place]

## 2023-09-25 NOTE — Telephone Encounter (Signed)
 Last seen on 09/01/23 No 6 month follow up scheduled yet.   Dispensed Days Supply Quantity Provider Pharmacy  DEXTROAMP-AMPHETAMIN 10 MG TAB 08/25/2023 30 30 each Sater, Sherida Dimmer, MD [facility] [phone] - R...   Rx pending to be signed

## 2023-10-23 ENCOUNTER — Other Ambulatory Visit: Payer: Self-pay | Admitting: Neurology

## 2023-10-23 MED ORDER — AMPHETAMINE-DEXTROAMPHETAMINE 10 MG PO TABS: 10.0000 mg | ORAL_TABLET | Freq: Every day | ORAL | 0 refills | Status: DC

## 2023-10-23 NOTE — Telephone Encounter (Signed)
 Patient request refill for amphetamine -dextroamphetamine  (ADDERALL) 10 MG tablet [id number]   Schedule appt with Lauraine 05/13/24 at patient request.

## 2023-10-23 NOTE — Telephone Encounter (Signed)
 Last seen on 09/01/23 Follow up scheduled on 05/14/23   Dispensed Days Supply Quantity Provider Pharmacy  DEXTROAMP-AMPHETAMIN 10 MG TAB 09/25/2023 30 30 each Vear Charlie LABOR, MD [facility] [phone] - R...     Rx pending to be signed

## 2023-11-12 DIAGNOSIS — R202 Paresthesia of skin: Secondary | ICD-10-CM | POA: Diagnosis not present

## 2023-11-12 DIAGNOSIS — R52 Pain, unspecified: Secondary | ICD-10-CM | POA: Diagnosis not present

## 2023-11-12 DIAGNOSIS — H04123 Dry eye syndrome of bilateral lacrimal glands: Secondary | ICD-10-CM | POA: Diagnosis not present

## 2023-11-12 DIAGNOSIS — M2550 Pain in unspecified joint: Secondary | ICD-10-CM | POA: Diagnosis not present

## 2023-11-12 DIAGNOSIS — M791 Myalgia, unspecified site: Secondary | ICD-10-CM | POA: Diagnosis not present

## 2023-11-12 DIAGNOSIS — R7303 Prediabetes: Secondary | ICD-10-CM | POA: Diagnosis not present

## 2023-11-12 DIAGNOSIS — K5900 Constipation, unspecified: Secondary | ICD-10-CM | POA: Diagnosis not present

## 2023-11-18 ENCOUNTER — Telehealth: Payer: Self-pay | Admitting: Neurology

## 2023-11-18 DIAGNOSIS — G35 Multiple sclerosis: Secondary | ICD-10-CM

## 2023-11-18 MED ORDER — FINGOLIMOD HCL 0.5 MG PO CAPS: 0.5000 mg | ORAL_CAPSULE | Freq: Every day | ORAL | 3 refills | Status: AC

## 2023-11-18 NOTE — Telephone Encounter (Signed)
 Seleina  from [facility] called to request Pt medication refill   Fingolimod  HCl (GILENYA ) 0.5 MG CAPS  Pharmacy   [facility] - [facility] - [place], [place] - [address] (Ph: [phone])

## 2023-11-18 NOTE — Telephone Encounter (Signed)
 refilled

## 2023-11-25 ENCOUNTER — Encounter: Payer: Self-pay | Admitting: Neurology

## 2023-11-25 DIAGNOSIS — G35 Multiple sclerosis: Secondary | ICD-10-CM

## 2023-11-25 DIAGNOSIS — R5382 Chronic fatigue, unspecified: Secondary | ICD-10-CM

## 2023-11-25 MED ORDER — AMPHETAMINE-DEXTROAMPHETAMINE 10 MG PO TABS: 10.0000 mg | ORAL_TABLET | Freq: Every day | ORAL | 0 refills | Status: DC

## 2023-11-25 NOTE — Telephone Encounter (Signed)
 Last seen on 09/01/23 Follow up scheduled on 05/13/24   Dispensed Days Supply Quantity Provider Pharmacy  DEXTROAMP-AMPHETAMIN 10 MG TAB 10/26/2023 30 30 each Vear Charlie LABOR, MD [facility] [phone] - R...   Rx pending to be signed

## 2023-11-27 MED ORDER — AMPHETAMINE-DEXTROAMPHETAMINE 10 MG PO TABS: 10.0000 mg | ORAL_TABLET | Freq: Every day | ORAL | 0 refills | Status: DC

## 2023-11-27 NOTE — Addendum Note (Signed)
 Addended by: JOSHUA MAURILIO CROME on: 11/27/2023 09:51 AM   Modules accepted: Orders

## 2023-12-15 ENCOUNTER — Other Ambulatory Visit: Payer: Self-pay | Admitting: Neurology

## 2023-12-25 ENCOUNTER — Encounter: Payer: Self-pay | Admitting: Neurology

## 2023-12-25 DIAGNOSIS — R5382 Chronic fatigue, unspecified: Secondary | ICD-10-CM

## 2023-12-25 DIAGNOSIS — G35 Multiple sclerosis: Secondary | ICD-10-CM

## 2023-12-25 MED ORDER — AMPHETAMINE-DEXTROAMPHETAMINE 10 MG PO TABS: 10.0000 mg | ORAL_TABLET | Freq: Every day | ORAL | 0 refills | Status: DC

## 2023-12-25 NOTE — Telephone Encounter (Signed)
 Last seen on 09/01/23 Follow up scheduled on 05/13/24   Dispensed Days Supply Quantity Provider Pharmacy  DEXTROAMP-AMPHETAMIN 10 MG TAB 11/27/2023 30 30 each Vear Charlie LABOR, MD [facility] [phone] - R...   Rx pending to be signed

## 2024-01-09 DIAGNOSIS — R42 Dizziness and giddiness: Secondary | ICD-10-CM | POA: Diagnosis not present

## 2024-01-09 DIAGNOSIS — Z79899 Other long term (current) drug therapy: Secondary | ICD-10-CM | POA: Diagnosis not present

## 2024-01-09 DIAGNOSIS — E559 Vitamin D deficiency, unspecified: Secondary | ICD-10-CM | POA: Diagnosis not present

## 2024-01-09 DIAGNOSIS — D751 Secondary polycythemia: Secondary | ICD-10-CM | POA: Diagnosis not present

## 2024-01-09 DIAGNOSIS — G35 Multiple sclerosis: Secondary | ICD-10-CM | POA: Diagnosis not present

## 2024-01-09 DIAGNOSIS — K219 Gastro-esophageal reflux disease without esophagitis: Secondary | ICD-10-CM | POA: Diagnosis not present

## 2024-01-09 DIAGNOSIS — I10 Essential (primary) hypertension: Secondary | ICD-10-CM | POA: Diagnosis not present

## 2024-01-09 DIAGNOSIS — J439 Emphysema, unspecified: Secondary | ICD-10-CM | POA: Diagnosis not present

## 2024-01-27 ENCOUNTER — Encounter: Payer: Self-pay | Admitting: Neurology

## 2024-01-27 ENCOUNTER — Other Ambulatory Visit: Payer: Self-pay | Admitting: *Deleted

## 2024-01-27 DIAGNOSIS — G35D Multiple sclerosis, unspecified: Secondary | ICD-10-CM

## 2024-01-27 DIAGNOSIS — R5382 Chronic fatigue, unspecified: Secondary | ICD-10-CM

## 2024-01-27 MED ORDER — AMPHETAMINE-DEXTROAMPHETAMINE 10 MG PO TABS: 10.0000 mg | ORAL_TABLET | Freq: Every day | ORAL | 0 refills | Status: DC

## 2024-01-27 NOTE — Telephone Encounter (Signed)
 Pt sent mychart asking for referral on adderall. Last seen 09/01/23 and next f/u 02/24/24. Last refilled 12/28/23 #30.

## 2024-02-23 ENCOUNTER — Ambulatory Visit
Admission: RE | Admit: 2024-02-23 | Discharge: 2024-02-23 | Disposition: A | Source: Ambulatory Visit | Attending: Family Medicine | Admitting: Family Medicine

## 2024-02-23 DIAGNOSIS — R921 Mammographic calcification found on diagnostic imaging of breast: Secondary | ICD-10-CM

## 2024-02-24 ENCOUNTER — Ambulatory Visit (INDEPENDENT_AMBULATORY_CARE_PROVIDER_SITE_OTHER): Admitting: Neurology

## 2024-02-24 ENCOUNTER — Telehealth: Payer: Self-pay | Admitting: Neurology

## 2024-02-24 ENCOUNTER — Encounter: Payer: Self-pay | Admitting: Neurology

## 2024-02-24 ENCOUNTER — Other Ambulatory Visit: Payer: Self-pay | Admitting: Family Medicine

## 2024-02-24 VITALS — BP 145/93 | HR 94 | Ht 61.0 in | Wt 181.0 lb

## 2024-02-24 DIAGNOSIS — R5382 Chronic fatigue, unspecified: Secondary | ICD-10-CM | POA: Diagnosis not present

## 2024-02-24 DIAGNOSIS — G35D Multiple sclerosis, unspecified: Secondary | ICD-10-CM | POA: Diagnosis not present

## 2024-02-24 DIAGNOSIS — R928 Other abnormal and inconclusive findings on diagnostic imaging of breast: Secondary | ICD-10-CM

## 2024-02-24 DIAGNOSIS — R269 Unspecified abnormalities of gait and mobility: Secondary | ICD-10-CM

## 2024-02-24 MED ORDER — AMPHETAMINE-DEXTROAMPHETAMINE 10 MG PO TABS: 10.0000 mg | ORAL_TABLET | Freq: Every day | ORAL | 0 refills | Status: DC

## 2024-02-24 NOTE — Patient Instructions (Signed)
 Great to see you today! Continue current medications Check MRI of the brain Check labs today Call for worsening symptoms Follow-up in 6 months.  Thanks!

## 2024-02-24 NOTE — Telephone Encounter (Signed)
 no auth required sent to GI [phone]

## 2024-02-24 NOTE — Progress Notes (Signed)
 [facility]  PATIENT: Sue Sanchez DOB: 1971-01-27  REFERRING DOCTOR OR PCP:  Charlene Single, MD SOURCE: Patient, notes from Dr. Jenel, imaging and lab reports, MRI images personally reviewed. ________________________________  CHIEF COMPLAINT:  Chief Complaint  Patient presents with   Follow-up    Pt in room 55. Alone. Here for MS follow up.   Update 02/24/24 SS: Remains on Gilenya . Recheck MRI brain today. Still has MS hug feeling every 1-2 months, no more than 24 hours, falls asleep taking muscle relaxer/ibuprofen . Both knees swollen, balance is impaired. Was on crestor, switched to pravastatin due to myalgias. Potassium was low 3.3, Vitamin D  70. Has lost 20 lbs this year. Adderall helps with focus. Off Lexapro , doesn't want to be on it. Sees Dr. Octavia, saw Clem, vision has not changed, at times gets blurry, bad dry eye. Left arm and leg chronically weaker. Has migratory paresthesias to left side. Bladder leaks, occasional bowel urgency. Migraines doing much better, has cut back on mountain dews. Hasn't needed the Nurtec.   HISTORY OF PRESENT ILLNESS:  09/01/23 Dr. Vear: Imagine Nest is a 53 y.o. woman with multiple sclerosis.  She is on. GIlenya .    CBC/D shows lymphocytes are 0.4   MRI 06/29/2022 showed no new lesions  Currently, she notes gait is slow and she feels balance is off.  She can walk ok on a flat surface and could go 300 feet without a rest, she thinks.    Stairs are difficult - she needs to scoot down and bannister needed going up.    She notes numbness in digits 4/5 on the left.    Swallowing is better.  Speech is fine.      She has urinary urgency and occasional leakage.      Vision does well for the most part -- but blurriness occurs when she looks at a computer screen a long time.     She has fatigue.  Adderall helps more than modafinil ;.   Sleep is ok most nights  Migraines headaches are rare now since stopping Lexapro    Nurtec works  better than Relpax if Ibu does not work.     She is a well-developed well-nourished woman in no acute distress.  The head is normocephalic and atraumatic.  Sclera are anicteric.  Visible skin appears normal.  The neck has a good range of motion.   She is alert and fully oriented with fluent speech and good attention, knowledge and memory.  Extraocular muscles are intact.  Facial strength is normal.  She appears to have normal strength in the arms.  Rapid alternating movements and finger-nose-finger are performed well.    MS history: She was diagnosed with MS in 2014 after presenting with left sided numbness.   She was placed on Copaxone  but had issues with the injections.  She then switched to Tecfidera but had GI issues/gastritis.   She then swithced to Gilenya  around since 2016.    She has not had any major exacerbation though symptoms fluctuate.     IMAGING MRI brain 06/29/2022, 08/04/2021 , 08/14/2019 09/08/2014 show the same few T2/FLAIR hyperintense foci in the juxtacortical and periventricular white matter of the cerebral hemispheres in a pattern configuration consistent with chronic demyelinating plaque associated with multiple sclerosis.  Infratentorial white matter was normal.  None of the foci appear to be acute.  The overall plaque burden is light  MRI cervical spine 08/04/2020 showed no spinal plaques.   Has DJD At C3-C4, there is  a left paramedian disc protrusion distorting the left thecal sac and causing mild to moderate spinal stenosis.  There is no nerve root compression. At C4-C5, there are degenerative changes causing mild spinal stenosis and right greater than left foraminal narrowing but no nerve root compression. At C5-C6, there are degenerative changes causing mild spinal stenosis and moderately severe right and moderate left foraminal narrowing.  There is potential for right C6 nerve root compression. At C6-C7, there are degenerative changes causing mild to moderate spinal stenosis  but no nerve root compression.   REVIEW OF SYSTEMS: See HPI  ALLERGIES: No Known Allergies  HOME MEDICATIONS:  Current Outpatient Medications:    amphetamine -dextroamphetamine  (ADDERALL) 10 MG tablet, Take 1 tablet (10 mg total) by mouth daily with breakfast., Disp: 30 tablet, Rfl: 0   budesonide-formoterol (SYMBICORT) 160-4.5 MCG/ACT inhaler, Inhale 2 puffs into the lungs 2 (two) times daily., Disp: , Rfl:    Cholecalciferol  (VITAMIN D -3) 125 MCG (5000 UT) TABS, Take 5,000 Units by mouth daily., Disp: 90 tablet, Rfl: 0   co-enzyme Q-10 30 MG capsule, Take 100 mg by mouth daily., Disp: , Rfl:    cyclobenzaprine  (FLEXERIL ) 5 MG tablet, TAKE 2 TABLETS BY MOUTH AT  BEDTIME AS NEEDED FOR MUSCLE  SPASMS, Disp: 60 tablet, Rfl: 5   Esomeprazole Magnesium (NEXIUM PO), Take 1 tablet by mouth 2 (two) times daily., Disp: , Rfl:    Fingolimod  HCl (GILENYA ) 0.5 MG CAPS, Take 1 capsule (0.5 mg total) by mouth daily., Disp: 90 capsule, Rfl: 3   hydrochlorothiazide (HYDRODIURIL) 25 MG tablet, Take 25 mg by mouth daily., Disp: , Rfl:    ibuprofen  (ADVIL ,MOTRIN ) 200 MG tablet, Take 3 tablets (600 mg total) by mouth every 6 (six) hours as needed for headache or moderate pain., Disp: 30 tablet, Rfl: 0   Omega 3-6-9 Fatty Acids (TRIPLE OMEGA COMPLEX PO), Take by mouth., Disp: , Rfl:    potassium chloride (KLOR-CON M) 10 MEQ tablet, Take 10 mEq by mouth daily., Disp: , Rfl:    PROVENTIL HFA 108 (90 BASE) MCG/ACT inhaler, Inhale 2 puffs into the lungs every 6 (six) hours as needed for wheezing or shortness of breath. , Disp: , Rfl:    Rimegepant Sulfate (NURTEC) 75 MG TBDP, Take 1 tablet (75 mg total) by mouth as needed (Take 1 tablet at headache onset, max is 1 tablet in 24 hours)., Disp: 16 tablet, Rfl: 11   rosuvastatin (CRESTOR) 10 MG tablet, Take 10 mg by mouth at bedtime., Disp: , Rfl:    escitalopram  (LEXAPRO ) 10 MG tablet, TAKE 1 TABLET BY MOUTH DAILY (Patient not taking: Reported on 02/24/2024), Disp: 90  tablet, Rfl: 3  PAST MEDICAL HISTORY: Past Medical History:  Diagnosis Date   Abnormality of gait 03/03/2015   B12 deficiency 08/31/2014   Chronic fatigue 08/31/2014   Classic migraine 01/12/2016   COPD (chronic obstructive pulmonary disease) (HCC)    Gastroesophageal reflux disease    Migraine    MS (multiple sclerosis)    Numbness    bilateral lower extremity   Obesity    Osteoarthritis    diagnosed by orthopedics   Vitamin D  deficiency     PAST SURGICAL HISTORY: Past Surgical History:  Procedure Laterality Date   CESAREAN SECTION     COSMETIC SURGERY     lower right leg   FOOT SURGERY     Club feet   NASAL SINUS SURGERY     10/23/2016,,  dev septum, other   TUBAL LIGATION Bilateral  FAMILY HISTORY: Family History  Problem Relation Age of Onset   Diabetes Mother    Bipolar disorder Sister    Bipolar disorder Brother    Cancer Maternal Grandmother    Colon cancer Paternal Grandmother    COPD Paternal Grandfather    Heart disease Paternal Grandfather     SOCIAL HISTORY:  Social History   Socioeconomic History   Marital status: Divorced    Spouse name: Not on file   Number of children: 2   Years of education: college   Highest education level: Not on file  Occupational History   Occupation: Unemployed  Tobacco Use   Smoking status: Every Day    Current packs/day: 0.50    Average packs/day: 0.5 packs/day for 30.0 years (15.0 ttl pk-yrs)    Types: Cigarettes   Smokeless tobacco: Never  Vaping Use   Vaping status: Never Used  Substance and Sexual Activity   Alcohol use: No    Alcohol/week: 0.0 standard drinks of alcohol   Drug use: No   Sexual activity: Yes  Other Topics Concern   Not on file  Social History Narrative   Patient is Divorced with 2 children.   Patient has a college education.   Patient is right handed.   Caffeine Intake: 6 or more sodas daily      Patient has a dog named Butters.   Social Drivers of Research Scientist (physical Sciences) Strain: Not on file  Food Insecurity: Not on file  Transportation Needs: Not on file  Physical Activity: Not on file  Stress: Not on file  Social Connections: Not on file  Intimate Partner Violence: Not on file   PHYSICAL EXAM from prior visit  Vitals:   02/24/24 0839 02/24/24 0842  BP: (!) 150/100 (!) 145/93  Pulse: 94   Weight: 181 lb (82.1 kg)   Height: 5' 1 (1.549 m)    Body mass index is 34.2 kg/m.  General: The patient is well-developed and well-nourished and in no acute distress. Looks tired.   HEENT:  Head is /AT.  Sclera are anicteric.    Cardiovascular: The heart has a regular rate and rhythm with a normal S1 and S2. There were no murmurs, gallops or rubs.    Neurologic Exam  Mental status: The patient is alert and oriented x 3 at the time of the examination. The patient has apparent normal recent and remote memory, with an apparently normal attention span and concentration ability.   Speech is normal.  Cranial nerves: Extraocular movements are full. Pupils are equal, round, and reactive to light and accomodation.  Visual fields are full.   Motor: Strength is normal except 4/5 left leg  Sensory: Sensory testing is intact to pinprick, soft touch and vibration sensation in all 4 extremities.  Coordination: Cerebellar testing reveals good finger-nose-finger and heel-to-shin bilaterally.  Gait and station: Gait is slightly wide-based, slight limp on the left  Reflexes: Deep tendon reflexes are symmetric but increased more on the left     DIAGNOSTIC DATA (LABS, IMAGING, TESTING) - I reviewed patient records, labs, notes, testing and imaging myself where available.  Lab Results  Component Value Date   WBC 6.9 02/11/2023   HGB 15.3 02/11/2023   HCT 45.8 02/11/2023   MCV 91 02/11/2023   PLT 357 02/11/2023      Component Value Date/Time   NA 140 02/11/2023 1536   K 3.4 (L) 02/11/2023 1536   CL 97 02/11/2023 1536   CO2 25 02/11/2023  1536    GLUCOSE 127 (H) 02/11/2023 1536   BUN 12 02/11/2023 1536   CREATININE 0.98 02/11/2023 1536   CALCIUM 10.0 02/11/2023 1536   PROT 6.8 02/11/2023 1536   ALBUMIN 4.2 02/11/2023 1536   AST 21 02/11/2023 1536   ALT 24 02/11/2023 1536   ALKPHOS 145 (H) 02/11/2023 1536   BILITOT 0.4 02/11/2023 1536   GFRNONAA 72 01/25/2020 1606   GFRAA 83 01/25/2020 1606    Lab Results  Component Value Date   VITAMINB12 1,130 01/13/2019   No results found for: TSH    ASSESSMENT AND PLAN  1.  Multiple sclerosis 2.  Gait abnormality 3.  Chronic fatigue 4.  Chronic migraine headache  - Continue Gilenya , check CBC, CMP - Check MRI of the brain with and without contrast for MS surveillance - Continue Adderall for fatigue, Flexeril  PRN for spasticity - Stay active, exercise - Dr. Vear had mentioned considering a switch to [facility] in her late 10s to allow her to stop daily MS medication  - Call for worsening symptoms, follow-up in 6 months with Dr. Vear to rotate every other visit with me  Meds ordered this encounter  Medications   amphetamine -dextroamphetamine  (ADDERALL) 10 MG tablet    Sig: Take 1 tablet (10 mg total) by mouth daily with breakfast.    Dispense:  30 tablet    Refill:  0   Lauraine Gayland MANDES, DNP  [facility] [address], [address] [place], [place] [address] [phone]

## 2024-02-25 ENCOUNTER — Ambulatory Visit: Payer: Self-pay | Admitting: Neurology

## 2024-02-25 LAB — CBC WITH DIFFERENTIAL/PLATELET
Basophils Absolute: 0 x10E3/uL (ref 0.0–0.2)
Basos: 1 %
EOS (ABSOLUTE): 0.2 x10E3/uL (ref 0.0–0.4)
Eos: 3 %
Hematocrit: 48.4 % — ABNORMAL HIGH (ref 34.0–46.6)
Hemoglobin: 16.4 g/dL — ABNORMAL HIGH (ref 11.1–15.9)
Immature Grans (Abs): 0 x10E3/uL (ref 0.0–0.1)
Immature Granulocytes: 0 %
Lymphocytes Absolute: 0.5 x10E3/uL — ABNORMAL LOW (ref 0.7–3.1)
Lymphs: 8 %
MCH: 30.9 pg (ref 26.6–33.0)
MCHC: 33.9 g/dL (ref 31.5–35.7)
MCV: 91 fL (ref 79–97)
Monocytes Absolute: 0.8 x10E3/uL (ref 0.1–0.9)
Monocytes: 13 %
Neutrophils Absolute: 4.7 x10E3/uL (ref 1.4–7.0)
Neutrophils: 74 %
Platelets: 384 x10E3/uL (ref 150–450)
RBC: 5.3 x10E6/uL — ABNORMAL HIGH (ref 3.77–5.28)
RDW: 12.8 % (ref 11.7–15.4)
WBC: 6.3 x10E3/uL (ref 3.4–10.8)

## 2024-02-25 LAB — COMPREHENSIVE METABOLIC PANEL WITH GFR
ALT: 31 IU/L (ref 0–32)
AST: 31 IU/L (ref 0–40)
Albumin: 4.5 g/dL (ref 3.8–4.9)
Alkaline Phosphatase: 166 IU/L — ABNORMAL HIGH (ref 49–135)
BUN/Creatinine Ratio: 16 (ref 9–23)
BUN: 14 mg/dL (ref 6–24)
Bilirubin Total: 0.7 mg/dL (ref 0.0–1.2)
CO2: 23 mmol/L (ref 20–29)
Calcium: 9.9 mg/dL (ref 8.7–10.2)
Chloride: 99 mmol/L (ref 96–106)
Creatinine, Ser: 0.89 mg/dL (ref 0.57–1.00)
Globulin, Total: 2.3 g/dL (ref 1.5–4.5)
Glucose: 95 mg/dL (ref 70–99)
Potassium: 4.2 mmol/L (ref 3.5–5.2)
Sodium: 138 mmol/L (ref 134–144)
Total Protein: 6.8 g/dL (ref 6.0–8.5)
eGFR: 77 mL/min/1.73 (ref >59)

## 2024-03-16 ENCOUNTER — Ambulatory Visit
Admission: RE | Admit: 2024-03-16 | Discharge: 2024-03-16 | Disposition: A | Source: Ambulatory Visit | Attending: Neurology | Admitting: Neurology

## 2024-03-16 DIAGNOSIS — G35D Multiple sclerosis, unspecified: Secondary | ICD-10-CM | POA: Diagnosis not present

## 2024-03-16 MED ORDER — GADOPICLENOL 0.5 MMOL/ML IV SOLN
8.0000 mL | Freq: Once | INTRAVENOUS | Status: AC | PRN
  Administered 2024-03-16: 8 mL via INTRAVENOUS

## 2024-03-24 ENCOUNTER — Encounter: Payer: Self-pay | Admitting: Neurology

## 2024-03-24 DIAGNOSIS — G35D Multiple sclerosis, unspecified: Secondary | ICD-10-CM

## 2024-03-24 DIAGNOSIS — R5382 Chronic fatigue, unspecified: Secondary | ICD-10-CM

## 2024-03-24 MED ORDER — AMPHETAMINE-DEXTROAMPHETAMINE 10 MG PO TABS: 10.0000 mg | ORAL_TABLET | Freq: Every day | ORAL | 0 refills | Status: DC

## 2024-03-24 NOTE — Telephone Encounter (Signed)
 Requested Prescriptions   Pending Prescriptions Disp Refills   amphetamine -dextroamphetamine  (ADDERALL) 10 MG tablet 30 tablet 0    Sig: Take 1 tablet (10 mg total) by mouth daily with breakfast.   Last seen 02/24/24 Next appt not scheduled  Dispenses   Dispensed Days Supply Quantity Provider Pharmacy  DEXTROAMP-AMPHETAMIN 10 MG TAB 02/24/2024 30 30 each Gayland Lauraine PARAS, NP [facility] [phone] - R...  DEXTROAMP-AMPHETAMIN 10 MG TAB 01/27/2024 30 30 each Sater, Charlie LABOR, MD [facility] [phone] - R...  DEXTROAMP-AMPHETAMIN 10 MG TAB 12/28/2023 30 30 each Sater, Charlie LABOR, MD [facility] [phone] - R...  DEXTROAMP-AMPHETAMIN 10 MG TAB 11/27/2023 30 30 each Sater, Charlie LABOR, MD [facility] [phone] - R...  DEXTROAMP-AMPHETAMIN 10 MG TAB 10/26/2023 30 30 each Sater, Charlie LABOR, MD [facility] [phone] - R...  DEXTROAMP-AMPHETAMIN 10 MG TAB 09/25/2023 30 30 each Sater, Charlie LABOR, MD [facility] [phone] - R...  DEXTROAMP-AMPHETAMIN 10 MG TAB 08/25/2023 30 30 each Sater, Charlie LABOR, MD [facility] [phone] - R...  DEXTROAMP-AMPHETAMIN 10 MG TAB 07/28/2023 30 30 each Sater, Charlie LABOR, MD [facility] [phone] - R...  DEXTROAMP-AMPHETAMIN 10 MG TAB 06/27/2023 30 30 each Sater, Charlie LABOR, MD [facility] [phone] - R...  DEXTROAMP-AMPHETAMIN 10 MG TAB 05/21/2023 30 30 each Sater, Charlie LABOR, MD [facility] [phone] - R...  DEXTROAMP-AMPHETAMIN 10 MG TAB 04/21/2023 30 30 each Sater, Charlie LABOR, MD [facility] [phone] - R.SABRASABRA

## 2024-04-26 ENCOUNTER — Encounter: Payer: Self-pay | Admitting: Neurology

## 2024-04-26 DIAGNOSIS — G35D Multiple sclerosis, unspecified: Secondary | ICD-10-CM

## 2024-04-26 DIAGNOSIS — R5382 Chronic fatigue, unspecified: Secondary | ICD-10-CM

## 2024-04-26 MED ORDER — AMPHETAMINE-DEXTROAMPHETAMINE 10 MG PO TABS: 10.0000 mg | ORAL_TABLET | Freq: Every day | ORAL | 0 refills | Status: AC

## 2024-04-26 NOTE — Telephone Encounter (Signed)
 Requested Prescriptions   Pending Prescriptions Disp Refills   amphetamine -dextroamphetamine  (ADDERALL) 10 MG tablet 30 tablet 5    Sig: Take 1 tablet (10 mg total) by mouth daily with breakfast.   Last seen 02/24/24 Next appt 09/20/24 Dispenses   Dispensed Days Supply Quantity Provider Pharmacy  DEXTROAMP-AMPHETAMIN 10 MG TAB 03/26/2024 30 30 each Vear Charlie LABOR, MD [facility] [phone] - R...  DEXTROAMP-AMPHETAMIN 10 MG TAB 02/24/2024 30 30 each Gayland Lauraine PARAS, NP [facility] [phone] - R...  DEXTROAMP-AMPHETAMIN 10 MG TAB 01/27/2024 30 30 each Sater, Charlie LABOR, MD [facility] [phone] - R...  DEXTROAMP-AMPHETAMIN 10 MG TAB 12/28/2023 30 30 each Sater, Charlie LABOR, MD [facility] [phone] - R...  DEXTROAMP-AMPHETAMIN 10 MG TAB 11/27/2023 30 30 each Sater, Charlie LABOR, MD [facility] [phone] - R...  DEXTROAMP-AMPHETAMIN 10 MG TAB 10/26/2023 30 30 each Sater, Charlie LABOR, MD [facility] [phone] - R...  DEXTROAMP-AMPHETAMIN 10 MG TAB 09/25/2023 30 30 each Sater, Charlie LABOR, MD [facility] [phone] - R...  DEXTROAMP-AMPHETAMIN 10 MG TAB 08/25/2023 30 30 each Sater, Charlie LABOR, MD [facility] [phone] - R...  DEXTROAMP-AMPHETAMIN 10 MG TAB 07/28/2023 30 30 each Sater, Charlie LABOR, MD [facility] [phone] - R...  DEXTROAMP-AMPHETAMIN 10 MG TAB 06/27/2023 30 30 each Sater, Charlie LABOR, MD [facility] [phone] - R...  DEXTROAMP-AMPHETAMIN 10 MG TAB 05/21/2023 30 30 each Sater, Charlie LABOR, MD [facility] [phone] - R.SABRASABRA

## 2024-05-13 ENCOUNTER — Ambulatory Visit: Admitting: Neurology

## 2024-08-23 ENCOUNTER — Encounter

## 2024-09-20 ENCOUNTER — Ambulatory Visit: Admitting: Neurology
